# Patient Record
Sex: Male | Born: 1940 | Race: Black or African American | Hispanic: No | Marital: Married | Smoking: Never smoker
Health system: Southern US, Community
[De-identification: ages and names within clinical notes are randomized; demographics above are authoritative.]

## PROBLEM LIST (undated history)

## (undated) DIAGNOSIS — Z9119 Patient's noncompliance with other medical treatment and regimen: Secondary | ICD-10-CM

## (undated) DIAGNOSIS — I10 Essential (primary) hypertension: Secondary | ICD-10-CM

## (undated) DIAGNOSIS — L089 Local infection of the skin and subcutaneous tissue, unspecified: Secondary | ICD-10-CM

## (undated) DIAGNOSIS — M1990 Unspecified osteoarthritis, unspecified site: Secondary | ICD-10-CM

## (undated) DIAGNOSIS — S22080A Wedge compression fracture of T11-T12 vertebra, initial encounter for closed fracture: Secondary | ICD-10-CM

## (undated) DIAGNOSIS — Z91199 Patient's noncompliance with other medical treatment and regimen due to unspecified reason: Secondary | ICD-10-CM

## (undated) DIAGNOSIS — I428 Other cardiomyopathies: Secondary | ICD-10-CM

## (undated) DIAGNOSIS — E782 Mixed hyperlipidemia: Secondary | ICD-10-CM

## (undated) DIAGNOSIS — F09 Unspecified mental disorder due to known physiological condition: Secondary | ICD-10-CM

## (undated) DIAGNOSIS — R51 Headache: Secondary | ICD-10-CM

## (undated) DIAGNOSIS — C61 Malignant neoplasm of prostate: Secondary | ICD-10-CM

## (undated) DIAGNOSIS — R519 Headache, unspecified: Secondary | ICD-10-CM

## (undated) DIAGNOSIS — G8929 Other chronic pain: Secondary | ICD-10-CM

## (undated) DIAGNOSIS — E1142 Type 2 diabetes mellitus with diabetic polyneuropathy: Secondary | ICD-10-CM

## (undated) DIAGNOSIS — M810 Age-related osteoporosis without current pathological fracture: Secondary | ICD-10-CM

## (undated) DIAGNOSIS — F419 Anxiety disorder, unspecified: Secondary | ICD-10-CM

## (undated) HISTORY — DX: Local infection of the skin and subcutaneous tissue, unspecified: L08.9

## (undated) HISTORY — DX: Wedge compression fracture of T11-T12 vertebra, initial encounter for closed fracture: S22.080A

## (undated) HISTORY — DX: Age-related osteoporosis without current pathological fracture: M81.0

---

## 2001-04-20 ENCOUNTER — Encounter: Payer: Self-pay | Admitting: Emergency Medicine

## 2001-04-21 ENCOUNTER — Inpatient Hospital Stay (HOSPITAL_COMMUNITY): Admission: EM | Admit: 2001-04-21 | Discharge: 2001-04-27 | Payer: Self-pay | Admitting: Emergency Medicine

## 2001-04-21 ENCOUNTER — Encounter: Payer: Self-pay | Admitting: Family Medicine

## 2001-04-22 ENCOUNTER — Encounter: Payer: Self-pay | Admitting: Internal Medicine

## 2001-04-24 ENCOUNTER — Encounter: Payer: Self-pay | Admitting: Internal Medicine

## 2001-08-13 ENCOUNTER — Encounter: Payer: Self-pay | Admitting: *Deleted

## 2001-08-13 ENCOUNTER — Emergency Department (HOSPITAL_COMMUNITY): Admission: EM | Admit: 2001-08-13 | Discharge: 2001-08-13 | Payer: Self-pay | Admitting: *Deleted

## 2001-12-19 ENCOUNTER — Emergency Department (HOSPITAL_COMMUNITY): Admission: EM | Admit: 2001-12-19 | Discharge: 2001-12-19 | Payer: Self-pay | Admitting: Emergency Medicine

## 2002-01-24 ENCOUNTER — Encounter: Payer: Self-pay | Admitting: Emergency Medicine

## 2002-01-24 ENCOUNTER — Emergency Department (HOSPITAL_COMMUNITY): Admission: EM | Admit: 2002-01-24 | Discharge: 2002-01-24 | Payer: Self-pay | Admitting: Internal Medicine

## 2002-03-09 ENCOUNTER — Emergency Department (HOSPITAL_COMMUNITY): Admission: EM | Admit: 2002-03-09 | Discharge: 2002-03-09 | Payer: Self-pay | Admitting: *Deleted

## 2002-04-26 ENCOUNTER — Observation Stay (HOSPITAL_COMMUNITY): Admission: EM | Admit: 2002-04-26 | Discharge: 2002-04-28 | Payer: Self-pay | Admitting: Emergency Medicine

## 2002-04-26 ENCOUNTER — Encounter: Payer: Self-pay | Admitting: Emergency Medicine

## 2002-04-27 ENCOUNTER — Encounter: Payer: Self-pay | Admitting: Family Medicine

## 2002-05-15 ENCOUNTER — Ambulatory Visit (HOSPITAL_COMMUNITY): Admission: RE | Admit: 2002-05-15 | Discharge: 2002-05-15 | Payer: Self-pay | Admitting: General Surgery

## 2002-05-28 ENCOUNTER — Emergency Department (HOSPITAL_COMMUNITY): Admission: EM | Admit: 2002-05-28 | Discharge: 2002-05-28 | Payer: Self-pay | Admitting: *Deleted

## 2003-04-03 ENCOUNTER — Ambulatory Visit (HOSPITAL_COMMUNITY): Admission: RE | Admit: 2003-04-03 | Discharge: 2003-04-03 | Payer: Self-pay | Admitting: Family Medicine

## 2003-04-03 ENCOUNTER — Encounter: Payer: Self-pay | Admitting: Family Medicine

## 2003-05-06 ENCOUNTER — Emergency Department (HOSPITAL_COMMUNITY): Admission: EM | Admit: 2003-05-06 | Discharge: 2003-05-06 | Payer: Self-pay | Admitting: Emergency Medicine

## 2004-03-08 ENCOUNTER — Emergency Department (HOSPITAL_COMMUNITY): Admission: EM | Admit: 2004-03-08 | Discharge: 2004-03-08 | Payer: Self-pay | Admitting: Emergency Medicine

## 2004-03-30 ENCOUNTER — Emergency Department (HOSPITAL_COMMUNITY): Admission: EM | Admit: 2004-03-30 | Discharge: 2004-03-30 | Payer: Self-pay | Admitting: Emergency Medicine

## 2004-05-13 ENCOUNTER — Emergency Department (HOSPITAL_COMMUNITY): Admission: EM | Admit: 2004-05-13 | Discharge: 2004-05-13 | Payer: Self-pay | Admitting: Emergency Medicine

## 2004-05-26 ENCOUNTER — Emergency Department (HOSPITAL_COMMUNITY): Admission: EM | Admit: 2004-05-26 | Discharge: 2004-05-26 | Payer: Self-pay | Admitting: Emergency Medicine

## 2004-07-05 ENCOUNTER — Emergency Department (HOSPITAL_COMMUNITY): Admission: EM | Admit: 2004-07-05 | Discharge: 2004-07-05 | Payer: Self-pay | Admitting: Emergency Medicine

## 2004-07-31 ENCOUNTER — Emergency Department (HOSPITAL_COMMUNITY): Admission: EM | Admit: 2004-07-31 | Discharge: 2004-07-31 | Payer: Self-pay | Admitting: Emergency Medicine

## 2005-05-31 ENCOUNTER — Emergency Department (HOSPITAL_COMMUNITY): Admission: EM | Admit: 2005-05-31 | Discharge: 2005-05-31 | Payer: Self-pay | Admitting: Emergency Medicine

## 2005-07-15 ENCOUNTER — Emergency Department (HOSPITAL_COMMUNITY): Admission: EM | Admit: 2005-07-15 | Discharge: 2005-07-15 | Payer: Self-pay | Admitting: Emergency Medicine

## 2005-08-31 ENCOUNTER — Emergency Department (HOSPITAL_COMMUNITY): Admission: EM | Admit: 2005-08-31 | Discharge: 2005-08-31 | Payer: Self-pay | Admitting: Emergency Medicine

## 2006-05-29 ENCOUNTER — Emergency Department (HOSPITAL_COMMUNITY): Admission: EM | Admit: 2006-05-29 | Discharge: 2006-05-29 | Payer: Self-pay | Admitting: Emergency Medicine

## 2007-03-15 ENCOUNTER — Emergency Department (HOSPITAL_COMMUNITY): Admission: EM | Admit: 2007-03-15 | Discharge: 2007-03-15 | Payer: Self-pay | Admitting: Emergency Medicine

## 2007-05-27 ENCOUNTER — Emergency Department (HOSPITAL_COMMUNITY): Admission: EM | Admit: 2007-05-27 | Discharge: 2007-05-27 | Payer: Self-pay | Admitting: Emergency Medicine

## 2007-08-16 ENCOUNTER — Emergency Department (HOSPITAL_COMMUNITY): Admission: EM | Admit: 2007-08-16 | Discharge: 2007-08-16 | Payer: Self-pay | Admitting: Emergency Medicine

## 2007-11-05 ENCOUNTER — Emergency Department (HOSPITAL_COMMUNITY): Admission: EM | Admit: 2007-11-05 | Discharge: 2007-11-05 | Payer: Self-pay | Admitting: Emergency Medicine

## 2007-11-09 DIAGNOSIS — C61 Malignant neoplasm of prostate: Secondary | ICD-10-CM

## 2007-11-09 HISTORY — DX: Malignant neoplasm of prostate: C61

## 2007-12-29 ENCOUNTER — Emergency Department (HOSPITAL_COMMUNITY): Admission: EM | Admit: 2007-12-29 | Discharge: 2007-12-29 | Payer: Self-pay | Admitting: Emergency Medicine

## 2008-02-03 ENCOUNTER — Emergency Department (HOSPITAL_COMMUNITY): Admission: EM | Admit: 2008-02-03 | Discharge: 2008-02-03 | Payer: Self-pay | Admitting: Emergency Medicine

## 2008-02-04 ENCOUNTER — Inpatient Hospital Stay (HOSPITAL_COMMUNITY): Admission: EM | Admit: 2008-02-04 | Discharge: 2008-02-08 | Payer: Self-pay | Admitting: Emergency Medicine

## 2008-03-08 HISTORY — PX: TRANSURETHRAL RESECTION OF PROSTATE: SHX73

## 2008-03-12 ENCOUNTER — Emergency Department (HOSPITAL_COMMUNITY): Admission: EM | Admit: 2008-03-12 | Discharge: 2008-03-12 | Payer: Self-pay | Admitting: Emergency Medicine

## 2008-03-26 ENCOUNTER — Emergency Department (HOSPITAL_COMMUNITY): Admission: EM | Admit: 2008-03-26 | Discharge: 2008-03-26 | Payer: Self-pay | Admitting: Emergency Medicine

## 2008-04-02 ENCOUNTER — Encounter (INDEPENDENT_AMBULATORY_CARE_PROVIDER_SITE_OTHER): Payer: Self-pay | Admitting: Urology

## 2008-04-02 ENCOUNTER — Inpatient Hospital Stay (HOSPITAL_COMMUNITY): Admission: RE | Admit: 2008-04-02 | Discharge: 2008-04-05 | Payer: Self-pay | Admitting: Urology

## 2008-04-20 ENCOUNTER — Emergency Department (HOSPITAL_COMMUNITY): Admission: EM | Admit: 2008-04-20 | Discharge: 2008-04-20 | Payer: Self-pay | Admitting: Emergency Medicine

## 2008-04-25 ENCOUNTER — Encounter (HOSPITAL_COMMUNITY): Admission: RE | Admit: 2008-04-25 | Discharge: 2008-05-25 | Payer: Self-pay | Admitting: Urology

## 2008-06-05 ENCOUNTER — Ambulatory Visit: Admission: RE | Admit: 2008-06-05 | Discharge: 2008-09-03 | Payer: Self-pay | Admitting: Radiation Oncology

## 2009-02-06 HISTORY — PX: CARDIAC CATHETERIZATION: SHX172

## 2009-02-08 ENCOUNTER — Inpatient Hospital Stay (HOSPITAL_COMMUNITY): Admission: EM | Admit: 2009-02-08 | Discharge: 2009-02-11 | Payer: Self-pay | Admitting: Emergency Medicine

## 2009-02-08 ENCOUNTER — Ambulatory Visit: Payer: Self-pay | Admitting: Cardiology

## 2009-02-10 ENCOUNTER — Other Ambulatory Visit: Payer: Self-pay | Admitting: Cardiology

## 2009-02-10 ENCOUNTER — Ambulatory Visit: Payer: Self-pay | Admitting: Cardiovascular Disease

## 2009-02-10 ENCOUNTER — Encounter: Payer: Self-pay | Admitting: Cardiology

## 2009-02-10 ENCOUNTER — Encounter (INDEPENDENT_AMBULATORY_CARE_PROVIDER_SITE_OTHER): Payer: Self-pay | Admitting: Pulmonary Disease

## 2009-02-10 LAB — CONVERTED CEMR LAB
ALT: 10 units/L
AST: 19 units/L
Basophils Relative: 0.84 %
HDL: 44 mg/dL
MCV: 91 fL
Total Bilirubin: 0.8 mg/dL
Triglyceride fasting, serum: 94 mg/dL
WBC: 4.3 10*3/uL

## 2009-02-11 ENCOUNTER — Other Ambulatory Visit: Payer: Self-pay | Admitting: Cardiology

## 2009-02-11 ENCOUNTER — Encounter: Payer: Self-pay | Admitting: Cardiovascular Disease

## 2009-02-26 ENCOUNTER — Ambulatory Visit: Payer: Self-pay | Admitting: Physician Assistant

## 2009-03-03 ENCOUNTER — Ambulatory Visit: Payer: Self-pay | Admitting: Cardiology

## 2009-03-10 ENCOUNTER — Emergency Department (HOSPITAL_COMMUNITY): Admission: EM | Admit: 2009-03-10 | Discharge: 2009-03-10 | Payer: Self-pay | Admitting: Emergency Medicine

## 2009-08-31 ENCOUNTER — Emergency Department (HOSPITAL_COMMUNITY): Admission: EM | Admit: 2009-08-31 | Discharge: 2009-08-31 | Payer: Self-pay | Admitting: Emergency Medicine

## 2009-11-23 ENCOUNTER — Emergency Department (HOSPITAL_COMMUNITY): Admission: EM | Admit: 2009-11-23 | Discharge: 2009-11-23 | Payer: Self-pay | Admitting: Emergency Medicine

## 2009-12-04 ENCOUNTER — Ambulatory Visit (HOSPITAL_COMMUNITY): Admission: RE | Admit: 2009-12-04 | Discharge: 2009-12-04 | Payer: Self-pay | Admitting: Urology

## 2010-01-13 ENCOUNTER — Ambulatory Visit (HOSPITAL_COMMUNITY): Admission: RE | Admit: 2010-01-13 | Discharge: 2010-01-14 | Payer: Self-pay | Admitting: Urology

## 2010-01-13 ENCOUNTER — Encounter (INDEPENDENT_AMBULATORY_CARE_PROVIDER_SITE_OTHER): Payer: Self-pay | Admitting: Urology

## 2010-02-10 ENCOUNTER — Ambulatory Visit (HOSPITAL_COMMUNITY): Payer: Self-pay | Admitting: Oncology

## 2010-02-10 ENCOUNTER — Encounter (HOSPITAL_COMMUNITY): Admission: RE | Admit: 2010-02-10 | Discharge: 2010-03-12 | Payer: Self-pay | Admitting: Oncology

## 2010-03-12 ENCOUNTER — Encounter (INDEPENDENT_AMBULATORY_CARE_PROVIDER_SITE_OTHER): Payer: Self-pay | Admitting: *Deleted

## 2010-03-17 ENCOUNTER — Emergency Department (HOSPITAL_COMMUNITY): Admission: EM | Admit: 2010-03-17 | Discharge: 2010-03-18 | Payer: Self-pay | Admitting: Emergency Medicine

## 2010-03-18 ENCOUNTER — Inpatient Hospital Stay (HOSPITAL_COMMUNITY): Admission: EM | Admit: 2010-03-18 | Discharge: 2010-03-25 | Payer: Self-pay | Admitting: Emergency Medicine

## 2010-03-23 ENCOUNTER — Ambulatory Visit: Payer: Self-pay | Admitting: Oncology

## 2010-04-17 ENCOUNTER — Ambulatory Visit (HOSPITAL_COMMUNITY): Payer: Self-pay | Admitting: Oncology

## 2010-04-17 ENCOUNTER — Encounter (HOSPITAL_COMMUNITY): Admission: RE | Admit: 2010-04-17 | Discharge: 2010-05-17 | Payer: Self-pay | Admitting: Oncology

## 2010-04-21 ENCOUNTER — Encounter (INDEPENDENT_AMBULATORY_CARE_PROVIDER_SITE_OTHER): Payer: Self-pay | Admitting: *Deleted

## 2010-04-21 LAB — CONVERTED CEMR LAB
BUN: 19 mg/dL
CO2: 24 meq/L
Chloride: 105 meq/L
Creatinine, Ser: 0.67 mg/dL
Glucose, Bld: 152 mg/dL
Hgb A1c MFr Bld: 9.7 %
Potassium: 3.8 meq/L

## 2010-05-02 ENCOUNTER — Inpatient Hospital Stay (HOSPITAL_COMMUNITY): Admission: AD | Admit: 2010-05-02 | Discharge: 2010-05-07 | Payer: Self-pay | Admitting: Emergency Medicine

## 2010-05-04 ENCOUNTER — Encounter (INDEPENDENT_AMBULATORY_CARE_PROVIDER_SITE_OTHER): Payer: Self-pay | Admitting: Family Medicine

## 2010-05-08 ENCOUNTER — Emergency Department (HOSPITAL_COMMUNITY): Admission: EM | Admit: 2010-05-08 | Discharge: 2010-05-08 | Payer: Self-pay | Admitting: Emergency Medicine

## 2010-05-15 ENCOUNTER — Emergency Department (HOSPITAL_COMMUNITY): Admission: EM | Admit: 2010-05-15 | Discharge: 2010-05-15 | Payer: Self-pay | Admitting: Emergency Medicine

## 2010-05-16 ENCOUNTER — Emergency Department (HOSPITAL_COMMUNITY): Admission: EM | Admit: 2010-05-16 | Discharge: 2010-05-16 | Payer: Self-pay | Admitting: Emergency Medicine

## 2010-05-20 ENCOUNTER — Encounter (HOSPITAL_COMMUNITY): Admission: RE | Admit: 2010-05-20 | Discharge: 2010-06-19 | Payer: Self-pay | Admitting: Oncology

## 2010-06-08 ENCOUNTER — Emergency Department (HOSPITAL_COMMUNITY): Admission: EM | Admit: 2010-06-08 | Discharge: 2010-06-08 | Payer: Self-pay | Admitting: Emergency Medicine

## 2010-06-09 ENCOUNTER — Emergency Department (HOSPITAL_COMMUNITY): Admission: EM | Admit: 2010-06-09 | Discharge: 2010-06-09 | Payer: Self-pay | Admitting: Emergency Medicine

## 2010-06-15 ENCOUNTER — Encounter (INDEPENDENT_AMBULATORY_CARE_PROVIDER_SITE_OTHER): Payer: Self-pay | Admitting: *Deleted

## 2010-06-17 ENCOUNTER — Ambulatory Visit (HOSPITAL_COMMUNITY): Payer: Self-pay | Admitting: Oncology

## 2010-06-22 ENCOUNTER — Ambulatory Visit: Payer: Self-pay | Admitting: Cardiology

## 2010-06-22 DIAGNOSIS — C61 Malignant neoplasm of prostate: Secondary | ICD-10-CM

## 2010-06-23 ENCOUNTER — Encounter: Payer: Self-pay | Admitting: Adult Health

## 2010-06-23 ENCOUNTER — Ambulatory Visit (HOSPITAL_COMMUNITY): Admission: RE | Admit: 2010-06-23 | Discharge: 2010-06-23 | Payer: Self-pay | Admitting: Urology

## 2010-06-24 ENCOUNTER — Encounter (HOSPITAL_COMMUNITY): Admission: RE | Admit: 2010-06-24 | Discharge: 2010-07-24 | Payer: Self-pay | Admitting: Oncology

## 2010-07-25 ENCOUNTER — Emergency Department (HOSPITAL_COMMUNITY): Admission: EM | Admit: 2010-07-25 | Discharge: 2010-07-25 | Payer: Self-pay | Admitting: Emergency Medicine

## 2010-08-05 ENCOUNTER — Ambulatory Visit: Payer: Self-pay | Admitting: Gastroenterology

## 2010-08-12 ENCOUNTER — Encounter (HOSPITAL_COMMUNITY)
Admission: RE | Admit: 2010-08-12 | Discharge: 2010-09-11 | Payer: Self-pay | Source: Home / Self Care | Admitting: Oncology

## 2010-08-12 ENCOUNTER — Ambulatory Visit (HOSPITAL_COMMUNITY): Payer: Self-pay | Admitting: Oncology

## 2010-08-12 ENCOUNTER — Ambulatory Visit (HOSPITAL_COMMUNITY): Admission: RE | Admit: 2010-08-12 | Discharge: 2010-08-12 | Payer: Self-pay | Admitting: Urology

## 2010-08-14 ENCOUNTER — Ambulatory Visit (HOSPITAL_COMMUNITY): Admission: RE | Admit: 2010-08-14 | Discharge: 2010-08-14 | Payer: Self-pay | Admitting: Gastroenterology

## 2010-08-14 ENCOUNTER — Ambulatory Visit: Payer: Self-pay | Admitting: Gastroenterology

## 2010-09-17 ENCOUNTER — Encounter (INDEPENDENT_AMBULATORY_CARE_PROVIDER_SITE_OTHER): Payer: Self-pay | Admitting: *Deleted

## 2010-09-21 ENCOUNTER — Encounter (INDEPENDENT_AMBULATORY_CARE_PROVIDER_SITE_OTHER): Payer: Self-pay | Admitting: *Deleted

## 2010-09-21 ENCOUNTER — Encounter: Payer: Self-pay | Admitting: Cardiology

## 2010-09-21 DIAGNOSIS — E118 Type 2 diabetes mellitus with unspecified complications: Secondary | ICD-10-CM | POA: Insufficient documentation

## 2010-09-21 DIAGNOSIS — E785 Hyperlipidemia, unspecified: Secondary | ICD-10-CM

## 2010-09-28 ENCOUNTER — Ambulatory Visit (HOSPITAL_COMMUNITY): Payer: Self-pay | Admitting: Oncology

## 2010-09-28 ENCOUNTER — Encounter (HOSPITAL_COMMUNITY)
Admission: RE | Admit: 2010-09-28 | Discharge: 2010-10-28 | Payer: Self-pay | Source: Home / Self Care | Attending: Oncology | Admitting: Oncology

## 2010-10-26 ENCOUNTER — Ambulatory Visit: Payer: Self-pay | Admitting: Cardiology

## 2010-10-27 ENCOUNTER — Encounter: Payer: Self-pay | Admitting: Adult Health

## 2010-10-28 ENCOUNTER — Encounter (INDEPENDENT_AMBULATORY_CARE_PROVIDER_SITE_OTHER): Payer: Self-pay | Admitting: *Deleted

## 2010-10-28 LAB — CONVERTED CEMR LAB
Albumin: 3.7 g/dL
Alkaline Phosphatase: 93 units/L
Brain Natriuretic Peptide: 327.1
CO2: 24 meq/L
Calcium: 9.2 mg/dL
Chloride: 100 meq/L
Eosinophils Absolute: 0.3 10*3/uL
Glucose, Bld: 279 mg/dL
Hemoglobin: 10.5 g/dL
Lymphocytes Relative: 19 %
Lymphs Abs: 1.2 10*3/uL
Monocytes Relative: 11 %
RBC: 3.86 M/uL
RDW: 17.2 %
Sodium: 136 meq/L
Total Protein: 6.5 g/dL

## 2010-10-30 ENCOUNTER — Encounter: Payer: Self-pay | Admitting: Adult Health

## 2010-10-30 LAB — CONVERTED CEMR LAB
ALT: 15 units/L (ref 0–53)
Albumin: 3.7 g/dL (ref 3.5–5.2)
BUN: 25 mg/dL — ABNORMAL HIGH (ref 6–23)
CO2: 24 meq/L (ref 19–32)
Calcium: 9.2 mg/dL (ref 8.4–10.5)
Chloride: 100 meq/L (ref 96–112)
Creatinine, Ser: 0.96 mg/dL (ref 0.40–1.50)
Hemoglobin: 10.5 g/dL — ABNORMAL LOW (ref 13.0–17.0)
Lymphocytes Relative: 19 % (ref 12–46)
Monocytes Absolute: 0.7 10*3/uL (ref 0.1–1.0)
Monocytes Relative: 11 % (ref 3–12)
Neutro Abs: 4.2 10*3/uL (ref 1.7–7.7)
Potassium: 3.5 meq/L (ref 3.5–5.3)
Pro B Natriuretic peptide (BNP): 327.1 pg/mL — ABNORMAL HIGH (ref 0.0–100.0)
RBC: 3.86 M/uL — ABNORMAL LOW (ref 4.22–5.81)
WBC: 6.4 10*3/uL (ref 4.0–10.5)

## 2010-11-03 ENCOUNTER — Ambulatory Visit (HOSPITAL_COMMUNITY)
Admission: RE | Admit: 2010-11-03 | Discharge: 2010-11-03 | Payer: Self-pay | Source: Home / Self Care | Attending: Cardiology | Admitting: Cardiology

## 2010-11-11 ENCOUNTER — Encounter (HOSPITAL_COMMUNITY)
Admission: RE | Admit: 2010-11-11 | Discharge: 2010-12-08 | Payer: Self-pay | Source: Home / Self Care | Attending: Oncology | Admitting: Oncology

## 2010-11-11 LAB — CBC
HCT: 29.9 % — ABNORMAL LOW (ref 39.0–52.0)
Hemoglobin: 10 g/dL — ABNORMAL LOW (ref 13.0–17.0)
MCH: 27.9 pg (ref 26.0–34.0)
MCHC: 33.4 g/dL (ref 30.0–36.0)
MCV: 83.5 fL (ref 78.0–100.0)
Platelets: 408 10*3/uL — ABNORMAL HIGH (ref 150–400)
RBC: 3.58 MIL/uL — ABNORMAL LOW (ref 4.22–5.81)
RDW: 16.1 % — ABNORMAL HIGH (ref 11.5–15.5)
WBC: 5.2 10*3/uL (ref 4.0–10.5)

## 2010-11-11 LAB — COMPREHENSIVE METABOLIC PANEL
ALT: 21 U/L (ref 0–53)
AST: 18 U/L (ref 0–37)
Albumin: 3.1 g/dL — ABNORMAL LOW (ref 3.5–5.2)
Alkaline Phosphatase: 71 U/L (ref 39–117)
BUN: 24 mg/dL — ABNORMAL HIGH (ref 6–23)
CO2: 25 mEq/L (ref 19–32)
Calcium: 9 mg/dL (ref 8.4–10.5)
Chloride: 106 mEq/L (ref 96–112)
Creatinine, Ser: 0.88 mg/dL (ref 0.4–1.5)
GFR calc Af Amer: >60 mL/min (ref >60)
GFR calc non Af Amer: >60 mL/min (ref >60)
Glucose, Bld: 104 mg/dL — ABNORMAL HIGH (ref 70–99)
Potassium: 3.9 mEq/L (ref 3.5–5.1)
Sodium: 140 mEq/L (ref 135–145)
Total Bilirubin: 0.4 mg/dL (ref 0.3–1.2)
Total Protein: 6.6 g/dL (ref 6.0–8.3)

## 2010-11-11 LAB — PSA: PSA: 0.01 ng/mL — ABNORMAL LOW (ref <=4.00)

## 2010-11-16 ENCOUNTER — Encounter (INDEPENDENT_AMBULATORY_CARE_PROVIDER_SITE_OTHER): Payer: Self-pay | Admitting: *Deleted

## 2010-11-17 ENCOUNTER — Ambulatory Visit
Admission: RE | Admit: 2010-11-17 | Discharge: 2010-11-17 | Payer: Self-pay | Source: Home / Self Care | Attending: Cardiology | Admitting: Cardiology

## 2010-11-28 ENCOUNTER — Encounter: Payer: Self-pay | Admitting: Urology

## 2010-12-08 NOTE — Miscellaneous (Signed)
Summary: labs bmp,a1c,04/21/2010  Clinical Lists Changes  Observations: Added new observation of CALCIUM: 9.4 mg/dL (11/91/4782 95:62) Added new observation of CREATININE: 0.67 mg/dL (13/06/6577 46:96) Added new observation of BUN: 19 mg/dL (29/52/8413 24:40) Added new observation of BG RANDOM: 152 mg/dL (09/04/2535 64:40) Added new observation of CO2 PLSM/SER: 24 meq/L (04/21/2010 10:44) Added new observation of CL SERUM: 105 meq/L (04/21/2010 10:44) Added new observation of K SERUM: 3.8 meq/L (04/21/2010 10:44) Added new observation of NA: 140 meq/L (04/21/2010 10:44) Added new observation of HGBA1C: 9.7 % (04/21/2010 10:44)

## 2010-12-08 NOTE — Miscellaneous (Signed)
Summary: LABS BMP,A1C,04/21/2010  Clinical Lists Changes  Observations: Added new observation of CALCIUM: 9.4 mg/dL (54/07/8118 14:78) Added new observation of CREATININE: 0.67 mg/dL (29/56/2130 86:57) Added new observation of BUN: 19 mg/dL (84/69/6295 28:41) Added new observation of BG RANDOM: 152 mg/dL (32/44/0102 72:53) Added new observation of CO2 PLSM/SER: 24 meq/L (04/21/2010 14:08) Added new observation of CL SERUM: 105 meq/L (04/21/2010 14:08) Added new observation of K SERUM: 3.8 meq/L (04/21/2010 14:08) Added new observation of NA: 140 meq/L (04/21/2010 14:08) Added new observation of HGBA1C: 9.7 % (04/21/2010 14:08)

## 2010-12-08 NOTE — Miscellaneous (Signed)
Summary: carvedilol refill  Clinical Lists Changes  Medications: Added new medication of CARVEDILOL 6.25 MG TABS (CARVEDILOL) Take one tablet by mouth twice a day - Signed Rx of CARVEDILOL 6.25 MG TABS (CARVEDILOL) Take one tablet by mouth twice a day;  #60 x 0;  Signed;  Entered by: Teressa Lower RN;  Authorized by: Kathlen Brunswick, MD, [facility];  Method used: Electronically to [facility] [address], [place], [place], [place]  [address], Ph: [phone], Fax: [phone]    Prescriptions: CARVEDILOL 6.25 MG TABS (CARVEDILOL) Take one tablet by mouth twice a day  #60 x 0   Entered by:   Teressa Lower RN   Authorized by:   Kathlen Brunswick, MD, [facility]   Signed by:   Teressa Lower RN on 03/12/2010   Method used:   Electronically to        [facility]  [address] (retail)       [address]       [place], [place]  [address]       Ph: [phone]       Fax: [phone]   RxID:   [id number]

## 2010-12-08 NOTE — Assessment & Plan Note (Signed)
Summary: past due for f/u per pt's wife/tg   Visit Type:  Follow-up Referring Provider:  Dr.Javaid-urologist Primary Provider:  Dr.Dondiego  CC:  some sob.  History of Present Illness: Oscar Hickman is a very pleasant 70 y/o AAM with recent diagnosis of metastatic prostate CA.  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  Cardiac catherization in April 2010 showed nonobstructive CAD.  He has been started on Coreg 6.25mg  two times a day along with ACE-inhibitor prior to recent discharge on 6/24/20ll.  He presents today with no major complaints except for mild DOE and abdominal discomfort. He is compliant with medications, as his wife makes sure he gets them.  Current Medications (verified): 1)  Carvedilol 12.5 Mg Tabs (Carvedilol) .... Take 1 Tablet By Mouth Two Times A Day 2)  Bicalutamide 50 Mg Tabs (Bicalutamide) .... Take 1 Tab Daily 3)  Amlodipine Besylate 10 Mg Tabs (Amlodipine Besylate) .... Take 1 Tab Daily 4)  Metformin Hcl 500 Mg Tabs (Metformin Hcl) .... Take 1 Tab Two Times A Day 5)  Aspir-Low 81 Mg Tbec (Aspirin) .... Take 1 Tab Daily 6)  Sulfamethoxazole-Trimethoprim 400-80 Mg/30ml Soln (Sulfamethoxazole-Trimethoprim) .... Take 1 Tab Daily 7)  Tamsulosin Hcl 0.4 Mg Caps (Tamsulosin Hcl) .... Take 1 Tab Daily 8)  Quinapril Hcl 40 Mg Tabs (Quinapril Hcl) .... Take 1 Tab Daily 9)  Doxycycline Hyclate 100 Mg Caps (Doxycycline Hyclate) .... Take 1 Tab Two Times A Day  Allergies (verified): No Known Drug Allergies PMH-FH-SH reviewed-no changes except otherwise noted  Review of Systems       All other systems have been reviewed and are negative unless stated above.   Vital Signs:  Patient profile:   70 year old male Height:      63 inches Weight:      189 pounds BMI:     33.60 Pulse rate:   109 / minute BP sitting:   150 / 92  (right arm)  Vitals Entered By: Dreama Saa, CNA (June 22, 2010 2:43 PM)  Physical Exam  General:  Well developed, well  nourished, in no acute distress. Lungs:  Clear bilaterally to auscultation and percussion.decreased BS bilateral.   Heart:  Non-displaced PMI, chest non-tender;tachycardic rate and rhythm, S1, S2 without murmurs, rubs or gallops. Carotid upstroke normal, no bruit. Normal abdominal aortic size, no bruits. Femorals normal pulses, no bruits. Pedals normal pulses. No edema, no varicosities. Abdomen:  Bowel sounds positive; abdomen soft and non-tender Genitalia:  Urinary catheter bag. Extremities:  No clubbing or cyanosis. Neurologic:  Alert and oriented x 3. Psych:  Normal affect.   EKG  Procedure date:  06/22/2010  Findings:      Sinus tachycardia with rate of:  103  Impression & Recommendations:  Problem # 1:  OTHER PRIMARY CARDIOMYOPATHIES (ICD-425.4) He is complaining of mild DOE.  His heart rate is not well controlled or BP where I want it with EF of 34%.  Plan on increasing Coreg to 12.5mg  two times a day.  This has been discussed with his wife who gives him his medications.  We will see him in one month. His updated medication list for this problem includes:    Carvedilol 12.5 Mg Tabs (Carvedilol) .Marland Kitchen... Take 1 tablet by mouth two times a day    Amlodipine Besylate 10 Mg Tabs (Amlodipine besylate) .Marland Kitchen... Take 1 tab daily    Aspir-low 81 Mg Tbec (Aspirin) .Marland Kitchen... Take 1 tab daily    Quinapril Hcl 40 Mg  Tabs (Quinapril hcl) .Marland Kitchen... Take 1 tab daily  Problem # 2:  ADENOCARCINOMA, PROSTATE (ICD-185) Assessment: Deteriorated  Patient Instructions: 1)  Your physician recommends that you schedule a follow-up appointment in: 3 months 2)  Your physician has recommended you make the following change in your medication: increase Carvedilol to 12.5mg  by mouth two times a day (may double current tablets to equal 12.5mg  until you finish current bottle) 3)  Please get a pill container to keep up with medications and dosing. Prescriptions: CARVEDILOL 12.5 MG TABS (CARVEDILOL) take 1 tablet by mouth  two times a day  #60 x 3   Entered by:   Larita Fife Via LPN   Authorized by:   Joni Reining, NP   Signed by:   Larita Fife Via LPN on 78/46/9629   Method used:   Electronically to        [facility]* (retail)       [address] [place], [place]  [address]       Ph: [phone]       Fax: [phone]   RxID:   [id number]

## 2010-12-08 NOTE — Miscellaneous (Signed)
Summary: Missed three-month followup   CXR  Procedure date:  02/08/2009  Findings:      Cardiomegaly Calcification of the thoracic aorta Clear lung fields No pleural effusion  -  Date:  02/10/2009    WBC: 4.3    HGB: 13.6    HCT: 40.5    PLT: 220    MCV: 91    Basophil: 0.84    SGOT (AST): 19    SGPT (ALT): 10    T. 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 Resides in [place]; expecting his 16th grandchild and his first great grandchild. Retired from work at [employer]

## 2010-12-08 NOTE — Assessment & Plan Note (Signed)
Summary: HEME POS STOOLS   Visit Type:  Initial Consult Referring Provider:  Mariel Sleet, M.D. Primary Care Provider:  Janna Arch, M.D.  Chief Complaint:  heme pos stools.  History of Present Illness: Wiped self and saw blood a couple of times. Heme pos 2/3 stools. Still having blood in his urine and sometimes clear. No belly pain. Appetite: eats good. no nausea or vomiting. No melena, heartburn/indigestion, problems swallowing. Was taking ASA. No diarrhea or rare: constipation. No weight loss. HAS PSA MONTHLY AND EVERY 3 MOS HE GETS A SHOT. NO COLONOSCOPY.  Current Medications (verified): 1)  Carvedilol 12.5 Mg Tabs (Carvedilol) .... Take 1 Tablet By Mouth Two Times A Day 2)  Bicalutamide 50 Mg Tabs (Bicalutamide) .... Take 1 Tab Daily 3)  Amlodipine Besylate 10 Mg Tabs (Amlodipine Besylate) .... Take 1 Tab Daily 4)  Metformin Hcl 500 Mg Tabs (Metformin Hcl) .... Take 1 Tab Two Times A Day 5)  Tamsulosin Hcl 0.4 Mg Caps (Tamsulosin Hcl) .... Take 1 Tab Daily 6)  Quinapril Hcl 40 Mg Tabs (Quinapril Hcl) .... Take 1 Tab Daily 7)  Cipro 500 Mg Tabs (Ciprofloxacin Hcl) .... Take 1 Tablet By Mouth Two Times A Day 8)  Triamterene-Hctz 37.5-25 Mg Tabs (Triamterene-Hctz) .... Take 1 Tablet By Mouth Once A Day  Allergies (verified): No Known Drug Allergies  Past History:  Past Medical History: Last updated: 02/26/2009 Non-ischemic cardiomyopathy with ejection fraction of 25% Minimal nonobstructive coronary artery disease by cardiac catheterization April 2010 Diabetes mellitus Hypertension Hyperlipidemia Anxiety Osteoarthritis Prostate cancer Benign prostatic hypertrophy  Past Surgical History: Last updated: 02/26/2009 Cardiac catheterization April 2010 TURP May 2009 Prostate cancer resection and radiation therapy  Family History: Both parents died from congestive heart failure Brother with myocardial infarction and stroke Son with MI in his 51s No FH of Colon Cancer or  polyps  Social History: Former drinker-no current alcohol use Nonsmoker Married. Expecting his 16th grandchild and his first great grandchild. Retired  Review of Systems       PER HPI OTHERWISE ALL SYSTEMS NEGATIVE.  Vital Signs:  Patient profile:   70 year old male Height:      63 inches Weight:      181 pounds BMI:     32.18 Temp:     97.9 degrees F oral Pulse rate:   64 / minute BP sitting:   130 / 80  (left arm) Cuff size:   regular  Vitals Entered By: Cloria Spring LPN (August 05, 2010 10:55 AM)  Physical Exam  General:  Well developed, well nourished, no acute distress. Head:  Normocephalic and atraumatic. Eyes:  PERRL, no icterus. Mouth:  No deformity or lesions. Neck:  Supple; no masses. Lungs:  Clear throughout to auscultation. Heart:  Regular rate and rhythm; no murmurs. Abdomen:  Soft, nontender and nondistended. No masses noted. Normal bowel sounds. Extremities:  No edema or deformities noted. Neurologic:  Alert and  oriented x4;  grossly normal neurologically.  Impression & Recommendations:  Problem # 1:  HEMOCCULT POSITIVE STOOL (ICD-578.1) Assessment New LIKELY 2o TO locally invasive prostate CA, radiation proctitis, and less likely colorectal polyp or malignancy. Pt declines TCS at this time. FLEX SIG NEXT WEEK. OPV AFTER ENDO PERFORMED. Pt may need TCS eventually.  CC: PCP AND REF MD  Appended Document: Orders Update    Clinical Lists Changes  Orders: Added new Service order of New Patient Level IV (60454) - Signed

## 2010-12-08 NOTE — Letter (Signed)
Summary: Appointment - Missed  [name]  [address], [place] [address]   Phone: [phone]  Fax: [phone]     September 21, 2010 MRN: [id number]   Oscar Hickman [address] [place], [place]  [address]   Dear Mr. Unrein,  Our records indicate you missed your appointment on           09/21/10             with Dr.    Dietrich Pates      .                                    It is very important that we reach you to reschedule this appointment. We look forward to participating in your health care needs. Please contact us at the number listed above at your earliest convenience to reschedule this appointment.     Sincerely,    Glass blower/designer

## 2010-12-08 NOTE — Letter (Signed)
Summary: FLEX SIG ORDER  FLEX SIG ORDER   Imported By: Ave Filter 08/05/2010 12:05:49  _____________________________________________________________________  External Attachment:    Type:   Image     Comment:   External Document

## 2010-12-09 ENCOUNTER — Other Ambulatory Visit (HOSPITAL_COMMUNITY): Payer: Self-pay | Admitting: Oncology

## 2010-12-09 ENCOUNTER — Other Ambulatory Visit (HOSPITAL_COMMUNITY): Payer: Medicare HMO

## 2010-12-09 ENCOUNTER — Encounter (HOSPITAL_COMMUNITY): Payer: Medicare HMO | Attending: Oncology

## 2010-12-09 ENCOUNTER — Ambulatory Visit (HOSPITAL_COMMUNITY): Admit: 2010-12-09 | Payer: Self-pay | Admitting: Oncology

## 2010-12-09 DIAGNOSIS — C61 Malignant neoplasm of prostate: Secondary | ICD-10-CM | POA: Insufficient documentation

## 2010-12-09 DIAGNOSIS — E119 Type 2 diabetes mellitus without complications: Secondary | ICD-10-CM | POA: Insufficient documentation

## 2010-12-09 DIAGNOSIS — Z79899 Other long term (current) drug therapy: Secondary | ICD-10-CM | POA: Insufficient documentation

## 2010-12-09 LAB — COMPREHENSIVE METABOLIC PANEL
ALT: 11 U/L (ref 0–53)
AST: 14 U/L (ref 0–37)
Albumin: 3.3 g/dL — ABNORMAL LOW (ref 3.5–5.2)
CO2: 25 mEq/L (ref 19–32)
Chloride: 106 mEq/L (ref 96–112)
Creatinine, Ser: 0.83 mg/dL (ref 0.4–1.5)
GFR calc Af Amer: >60 mL/min (ref >60)
GFR calc non Af Amer: >60 mL/min (ref >60)
Potassium: 4 mEq/L (ref 3.5–5.1)
Sodium: 140 mEq/L (ref 135–145)
Total Bilirubin: 0.4 mg/dL (ref 0.3–1.2)

## 2010-12-09 LAB — CBC
Hemoglobin: 10.4 g/dL — ABNORMAL LOW (ref 13.0–17.0)
MCH: 27.6 pg (ref 26.0–34.0)
Platelets: 367 10*3/uL (ref 150–400)
RBC: 3.77 MIL/uL — ABNORMAL LOW (ref 4.22–5.81)
WBC: 4.7 10*3/uL (ref 4.0–10.5)

## 2010-12-10 NOTE — Assessment & Plan Note (Signed)
Summary: f55m   Visit Type:  Follow-up Referring Provider:  Oncology-Dr.Neijstrom Primary Provider:  Dr. Oval Linsey   History of Present Illness: Oscar Hickman is a 70 y/o AAM with hx of metastatic CA, we follow him for nonischemic CM with EF of 35% per echo in April of 2010.  He has completed chemo and radiation and is now being followed periodically by Dr. Loyola Mast.  He added Triameterene/HCTZ to medication regimin secondary to fluid retention on his last visit. Oscar Hickman states that the swelling went away with use of this.   He unfortunately is not compliant with sodium restricted diet and often eats at fast food restaraunts.  He does not cook with salt, but does eat cold cuts and sausage.  He has gain 15lbs since being seen last in August of 2011.  He is asymptomatic at present with no complaints of DOE or chest discomfort.  He wife states that he is very inactive. His main complaint is chronic itching of his back.  Current Medications (verified): 1)  Carvedilol 12.5 Mg Tabs (Carvedilol) .... Take 1 Tablet By Mouth Two Times A Day 2)  Bicalutamide 50 Mg Tabs (Bicalutamide) .... Take 1 Tab Daily 3)  Amlodipine Besylate 10 Mg Tabs (Amlodipine Besylate) .... Take 1 Tab Daily 4)  Metformin Hcl 500 Mg Tabs (Metformin Hcl) .... Take 1 Tab Two Times A Day 5)  Quinapril Hcl 40 Mg Tabs (Quinapril Hcl) .... Take 1 Tab Daily 6)  Triamterene-Hctz 37.5-25 Mg Tabs (Triamterene-Hctz) .... Take 1 Tablet By Mouth Once A Day 7)  Novolog Flexpen 100 Unit/ml Soln (Insulin Aspart) .... Patient Uses 25 Units Daily 8)  Benadryl Maximum Strength 2 % Crea (Diphenhydramine Hcl) .... As Needed For Itching  Allergies (verified): No Known Drug Allergies  Comments:  Nurse/Medical Assistant: patient brought meds and the only time he uses his triamterene/hctz 37.5/25 is when he has swelling in his feet and legs patient uses right source  Past History:  Past medical, surgical, family and social  histories (including risk factors) reviewed, and no changes noted (except as noted below).  Past Medical History: Reviewed history from 09/21/2010 and no changes required. 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 Cardiac catheterization April 2010 TURP May 2009 Prostate cancer resection and radiation therapy  Family History: Reviewed history from 08/05/2010 and no changes required. Both parents died from congestive heart failure Brother with myocardial infarction and stroke Son with MI in his 26s No FH of Colon Cancer or polyps  Social History: Reviewed history from 09/21/2010 and no changes required. Former drinker-no current alcohol use Nonsmoker Married. Resides in [place]; expecting his 16th grandchild and his first great grandchild. Retired from work at [employer]  Review of Systems       All other systems have been reviewed and are negative unless stated above.   Vital Signs:  Patient profile:   70 year old male Weight:      195 pounds BMI:     34.67 O2 Sat:      94 % on Room air Pulse rate:   82 / minute BP sitting:   145 / 89  (right arm)  Vitals Entered By: Dreama Saa,  CNA (October 27, 2010 3:03 PM)  O2 Flow:  Room air  Physical Exam  General:  Well developed, well nourished, in no acute distress. Lungs:  Clear bilaterally to auscultation and percussion. Heart:  RRR without MRG, no JVD or HJR.  Pulses are palpable. Abdomen:  Mild central obesity. Msk:  Back normal, normal gait.  Muscle strength and tone normal. Pulses:  pulses normal in all 4 extremities Extremities:  No clubbing or cyanosis. No edema Neurologic:  Alert and oriented x 3. Skin:  No lesions or dryness Psych:  Normal affect.   Impression & Recommendations:  Problem # 1:  CARDIOMYOPATHY (ICD-425.4) Will repeat his echo to evaluate LV fx for improvement. If so we can adjust some of the medications.  Plan CMET, BNP and CBC as well His updated medication list for this problem includes:    Carvedilol 12.5 Mg Tabs (Carvedilol) .Marland Kitchen... Take 1 tablet by mouth two times a day    Amlodipine Besylate 10 Mg Tabs (Amlodipine besylate) .Marland Kitchen... Take 1 tab daily    Quinapril Hcl 40 Mg Tabs (Quinapril hcl) .Marland Kitchen... Take 1 tab daily    Triamterene-hctz 37.5-25 Mg Tabs (Triamterene-hctz) .Marland Kitchen... Take 1 tablet by mouth once a day  Orders: 2-D Echocardiogram (2D Echo)  Problem # 2:  HYPERTENSION (ICD-401.1) Blood pressure is not well controlled on current medications-should be 130/70 for diabetic.  Once ECHO eval is completed along with labs, will make more recommendations on medications His updated medication list for this problem includes:    Carvedilol 12.5 Mg Tabs (Carvedilol) .Marland Kitchen... Take 1 tablet by mouth two times a day    Amlodipine Besylate 10 Mg Tabs (Amlodipine besylate) .Marland Kitchen... Take 1 tab daily    Quinapril Hcl 40 Mg Tabs (Quinapril hcl) .Marland Kitchen... Take 1 tab daily    Triamterene-hctz 37.5-25 Mg Tabs (Triamterene-hctz) .Marland Kitchen... Take 1 tablet by mouth once a day  Orders: T-Comprehensive Metabolic Panel [id number]) T-CBC w/Diff [id number])  Other Orders: T-BNP  (B Natriuretic Peptide) ([id number])  Patient Instructions: 1)  Your physician recommends that you schedule a follow-up appointment in: 3 weeks 2)  Your physician recommends that you return for lab work in: today 3)  Your physician has recommended you make the following change in your medication: Start taking Benedryl cream (over the counter) as needed for  itching  4)  Your physician has requested that you have an echocardiogram.  Echocardiography is a painless test that uses sound waves to create images of your heart. It provides your doctor with information about the size and shape of your heart and how well your heart's chambers and valves are working.  This procedure takes approximately one hour. There are no restrictions for this procedure.

## 2010-12-10 NOTE — Miscellaneous (Signed)
Summary: echo 01/04/2010  Clinical Lists Changes  Observations: Added new observation of ECHOINTERP:  Study Conclusions    - Left ventricle: Grade III diastolic dysfunction. Akinesis of the     infero-posterior wall and hypokinesis of the other walls. The     cavity size was mildly dilated. Wall thickness was increased in a     pattern of moderate LVH. The estimated ejection fraction was 25%.   - Aortic valve: Sclerosis without stenosis. Mild regurgitation.   - Mitral valve: Calcified annulus. Mild regurgitation.   - Left atrium: The atrium was severely dilated.   - Right ventricle: The cavity size was moderately dilated. Systolic     function was moderately reduced.   - Right atrium: The atrium was severely dilated.   - Pulmonary arteries: PA peak pressure: 61mm Hg (S).   - Pericardium, extracardiac: A trivial pericardial effusion was     identified posterior to the heart.   Transthoracic echocardiography. M-mode, complete 2D, spectral   Doppler, and color Doppler. Height: Height: 167.6cm. Height: 66in.   Weight: Weight: 88.5kg. Weight: 194.6lb. Body mass index: BMI:   31.5kg/m^2. Body surface area: BSA: 1.37m^2. Patient status:   Outpatient. Location: Echo laboratory.    -------------------------------------------------- (11/03/2010 10:36)      Echocardiogram  Procedure date:  11/03/2010  Findings:       Study Conclusions    - Left ventricle: Grade III diastolic dysfunction. Akinesis of the     infero-posterior wall and hypokinesis of the other walls. The     cavity size was mildly dilated. Wall thickness was increased in a     pattern of moderate LVH. The estimated ejection fraction was 25%.   - Aortic valve: Sclerosis without stenosis. Mild regurgitation.   - Mitral valve: Calcified annulus. Mild regurgitation.   - Left atrium: The atrium was severely dilated.   - Right ventricle: The cavity size was moderately dilated. Systolic     function was moderately reduced.   - Right atrium: The atrium was severely dilated.   - Pulmonary arteries: PA peak pressure: 61mm Hg (S).   - Pericardium, extracardiac: A trivial pericardial effusion was     identified posterior to the heart.   Transthoracic echocardiography. M-mode, complete 2D, spectral   Doppler, and color Doppler. Height: Height: 167.6cm. Height: 66in.   Weight: Weight: 88.5kg. Weight: 194.6lb. Body mass index: BMI:   31.5kg/m^2. Body surface area: BSA: 1.64m^2. Patient status:   Outpatient. Location: Echo laboratory.    --------------------------------------------------

## 2010-12-10 NOTE — Miscellaneous (Signed)
Summary: labs cbcd,cmp,bnp,10/28/2010  Clinical Lists Changes  Observations: Added new observation of CALCIUM: 9.2 mg/dL (15/17/6160 73:71) Added new observation of ALBUMIN: 3.7 g/dL (05/03/9484 46:27) Added new observation of PROTEIN, TOT: 6.5 g/dL (03/50/0938 18:29) Added new observation of SGPT (ALT): 15 units/L (10/28/2010 10:42) Added new observation of SGOT (AST): 14 units/L (10/28/2010 10:42) Added new observation of ALK PHOS: 93 units/L (10/28/2010 10:42) Added new observation of CREATININE: 0.96 mg/dL (93/71/6967 89:38) Added new observation of BUN: 25 mg/dL (08/24/5101 58:52) Added new observation of BG RANDOM: 279 mg/dL (77/82/4235 36:14) Added new observation of CO2 PLSM/SER: 24 meq/L (10/28/2010 10:42) Added new observation of CL SERUM: 100 meq/L (10/28/2010 10:42) Added new observation of K SERUM: 3.5 meq/L (10/28/2010 10:42) Added new observation of NA: 136 meq/L (10/28/2010 10:42) Added new observation of BNP: 327.1  (10/28/2010 10:42) Added new observation of ABSOLUTE BAS: 0.0 K/uL (10/28/2010 10:42) Added new observation of BASOPHIL %: 0 % (10/28/2010 10:42) Added new observation of EOS ABSLT: 0.3 K/uL (10/28/2010 10:42) Added new observation of % EOS AUTO: 5 % (10/28/2010 10:42) Added new observation of ABSOLUTE MON: 0.7 K/uL (10/28/2010 10:42) Added new observation of MONOCYTE %: 11 % (10/28/2010 10:42) Added new observation of ABS LYMPHOCY: 1.2 K/uL (10/28/2010 10:42) Added new observation of LYMPHS %: 19 % (10/28/2010 10:42) Added new observation of PLATELETK/UL: 354 K/uL (10/28/2010 10:42) Added new observation of RDW: 17.2 % (10/28/2010 10:42) Added new observation of MCHC RBC: 32.1 g/dL (43/15/4008 67:61) Added new observation of MCV: 84.7 fL (10/28/2010 10:42) Added new observation of HCT: 32.7 % (10/28/2010 10:42) Added new observation of HGB: 10.5 g/dL (95/07/3266 12:45) Added new observation of RBC M/UL: 3.86 M/uL (10/28/2010 10:42) Added new observation  of WBC COUNT: 6.4 10*3/microliter (10/28/2010 10:42)

## 2010-12-10 NOTE — Assessment & Plan Note (Signed)
Summary: 3 wk f/u per checkout on 10/27/10/tg   Visit Type:  Follow-up Referring Provider:  Oncology-Dr.Neijstrom Primary Provider:  Dr. Oval Linsey   History of Present Illness: [name] returns to the office as scheduled for continued assessment and treatment of cardiomyopathy.  Since his last visit, he has done generally well.  He denies orthopnea, PND, dyspnea on exertion or chest discomfort.  He now requires subcutaneous insulin for control of diabetes.  Recent laboratories show diabetes out of control and a mild to moderate normocytic anemia.   Current Medications (verified): 1)  Carvedilol 25 Mg Tabs (Carvedilol) .... Take One Tablet By Mouth Twice A Day 2)  Bicalutamide 50 Mg Tabs (Bicalutamide) .... Take 1 Tab Daily 3)  Amlodipine Besylate 10 Mg Tabs (Amlodipine Besylate) .... Take 1 Tab Daily 4)  Metformin Hcl 500 Mg Tabs (Metformin Hcl) .... Take 1 Tab Two Times A Day 5)  Quinapril Hcl 40 Mg Tabs (Quinapril Hcl) .... Take 1 Tab Daily 6)  Triamterene-Hctz 37.5-25 Mg Tabs (Triamterene-Hctz) .... Take 1 Tablet By Mouth Once A Day 7)  Novolog Flexpen 100 Unit/ml Soln (Insulin Aspart) .... 25 Units Subcutaneously Daily 8)  Benadryl Maximum Strength 2 % Crea (Diphenhydramine Hcl) .... As Needed For Itching 9)  Aspir-Low 81 Mg Tbec (Aspirin) .... Take 1 Tab Daily 10)  Hydralazine Hcl 50 Mg Tabs (Hydralazine Hcl) .... Take One Tablet By Mouth Three Times A Day 11)  Isosorbide Dinitrate 20 Mg Tabs (Isosorbide Dinitrate) .... Take One Capsule By Mouth Three Times A Day  Allergies (verified): No Known Drug Allergies  Comments:  Nurse/Medical Assistant: patient brought meds he is on the asa 81 mg daily  Past History:  PMH, FH, and Social History reviewed and updated.  Review of Systems  The patient denies weight loss, weight gain, hoarseness, chest pain, syncope, dyspnea on exertion, peripheral edema, headaches, and abdominal pain.    Vital Signs:  Patient  profile:   70 year old male Weight:      195 pounds O2 Sat:      95 % on Room air Pulse rate:   78 / minute BP sitting:   149 / 96  (left arm)  Vitals Entered By: Dreama Saa, CNA (November 17, 2010 1:41 PM)  O2 Flow:  Room air  Physical Exam  General:  Mildly overweight; well developed; no acute distress:   Neck-No JVD; no carotid bruits: Lungs-No tachypnea, no rales; no rhonchi; no wheezes; AICD located below the left subclavian margin Cardiovascular-normal PMI; normal S1 and S2: Abdomen-BS normal; soft and non-tender without masses or organomegaly:  Musculoskeletal-No deformities, no cyanosis or clubbing: Neurologic-Normal cranial nerves; symmetric strength and tone:  Skin-Warm, no significant lesions: Extremities-Nl distal pulses; 1-2+ edema:     Impression & Recommendations:  Problem # 1:  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  We will continue to adjust medications by increasing his dose of carvedilol and adding hydralazine and nitrates.  Problem # 2:  HYPERLIPIDEMIA (ICD-272.4) Most recent lipid profile was suboptimal.  Repeat values will be obtained.  CHOL: 218 (02/10/2009)   LDL: 156 (02/10/2009)   HDL: 44 (02/10/2009)   TG: 94 (02/10/2009)  Problem # 3:  HYPERTENSION (ICD-401.1) Blood pressure control is not optimal; addition of hydralazine and nitrates to medical regime should result in additional lowering of BP.  Patient Instructions: 1)  Your physician recommends that you schedule a follow-up appointment in: 2 months 2)  Your physician has recommended you make the following change in your  medication: increase carvedilol 25mg  two times a day;hydralazine 50mg  three times a day, isordil 20mg  three times a day Prescriptions: ISOSORBIDE DINITRATE 20 MG TABS (ISOSORBIDE DINITRATE) Take one capsule by mouth three times a day  #270 x 3   Entered by:   Teressa Lower RN   Authorized by:   Kathlen Brunswick, MD, [facility]   Signed by:   Teressa Lower RN on 11/17/2010   Method used:   Faxed to ...       [facility] (mail-order)             , [place]         Ph: [phone]       Fax: [phone]   RxID:   [id number] HYDRALAZINE HCL 50 MG TABS (HYDRALAZINE HCL) Take one tablet by mouth three times a day  #270 x 3   Entered by:   Teressa Lower RN   Authorized by:   Kathlen Brunswick, MD, [facility]   Signed by:   Teressa Lower RN on 11/17/2010   Method used:   Faxed to ...       [facility] (mail-order)             , [place]         Ph: [phone]       Fax: [phone]   RxID:   [id number] CARVEDILOL 25 MG TABS (CARVEDILOL) Take one tablet by mouth twice a day  #180 x 3   Entered by:   Teressa Lower RN   Authorized by:   Kathlen Brunswick, MD, [facility]   Signed by:   Teressa Lower RN on 11/17/2010   Method used:   Faxed to ...       [facility] (mail-order)             , [place]         Ph: [phone]       Fax: [phone]   RxID:   [id number] ISOSORBIDE DINITRATE 20 MG TABS (ISOSORBIDE DINITRATE) Take one capsule by mouth three times a day  #90 x 0   Entered by:   Teressa Lower RN   Authorized by:   Kathlen Brunswick, MD, [facility]   Signed by:   Teressa Lower RN on 11/17/2010   Method used:   Electronically to        [facility] (retail)       [address]       [place], [place]  [address]       Ph: [phone]       Fax: [phone]   RxID:   [id number] HYDRALAZINE HCL 50 MG TABS (HYDRALAZINE HCL) Take one tablet by mouth three times a day  #90 x 0   Entered by:   Teressa Lower RN   Authorized by:   Kathlen Brunswick, MD, [facility]   Signed by:   Teressa Lower RN on 11/17/2010   Method used:   Electronically to        [facility] (retail)       [address]       [place], [place]  [address]       Ph: [phone]       Fax: [phone]   RxID:   [id number]

## 2010-12-21 ENCOUNTER — Ambulatory Visit (HOSPITAL_COMMUNITY): Payer: Self-pay | Admitting: Oncology

## 2011-01-06 ENCOUNTER — Encounter (HOSPITAL_COMMUNITY): Payer: Medicare HMO

## 2011-01-06 ENCOUNTER — Other Ambulatory Visit (HOSPITAL_COMMUNITY): Payer: Self-pay

## 2011-01-18 LAB — CBC
MCH: 27.4 pg (ref 26.0–34.0)
MCHC: 33.4 g/dL (ref 30.0–36.0)
MCV: 81.8 fL (ref 78.0–100.0)
Platelets: 324 10*3/uL (ref 150–400)
RBC: 3.69 MIL/uL — ABNORMAL LOW (ref 4.22–5.81)

## 2011-01-18 LAB — COMPREHENSIVE METABOLIC PANEL
AST: 15 U/L (ref 0–37)
Albumin: 3.5 g/dL (ref 3.5–5.2)
BUN: 17 mg/dL (ref 6–23)
CO2: 29 mEq/L (ref 19–32)
Calcium: 9.6 mg/dL (ref 8.4–10.5)
Chloride: 105 mEq/L (ref 96–112)
Creatinine, Ser: 0.87 mg/dL (ref 0.4–1.5)
GFR calc Af Amer: >60 mL/min (ref >60)
GFR calc non Af Amer: >60 mL/min (ref >60)
Total Bilirubin: 0.6 mg/dL (ref 0.3–1.2)

## 2011-01-19 ENCOUNTER — Other Ambulatory Visit (HOSPITAL_COMMUNITY): Payer: Medicare HMO

## 2011-01-19 ENCOUNTER — Encounter (HOSPITAL_COMMUNITY): Payer: Medicare HMO | Attending: Oncology

## 2011-01-19 ENCOUNTER — Ambulatory Visit (INDEPENDENT_AMBULATORY_CARE_PROVIDER_SITE_OTHER): Payer: Medicare HMO | Admitting: Cardiology

## 2011-01-19 ENCOUNTER — Encounter: Payer: Self-pay | Admitting: Cardiology

## 2011-01-19 DIAGNOSIS — I428 Other cardiomyopathies: Secondary | ICD-10-CM

## 2011-01-19 DIAGNOSIS — C61 Malignant neoplasm of prostate: Secondary | ICD-10-CM

## 2011-01-19 DIAGNOSIS — E119 Type 2 diabetes mellitus without complications: Secondary | ICD-10-CM | POA: Insufficient documentation

## 2011-01-19 DIAGNOSIS — Z79899 Other long term (current) drug therapy: Secondary | ICD-10-CM | POA: Insufficient documentation

## 2011-01-19 LAB — CONVERTED CEMR LAB
BUN: 15 mg/dL
Creatinine, Ser: 0.86 mg/dL
GFR calc non Af Amer: >60 mL/min
Glomerular Filtration Rate, Af Am: >60 mL/min/{1.73_m2}
HCT: 32.1 %
Hemoglobin: 10.3 g/dL
MCV: 81.7 fL
PSA: <0.01 ng/mL
Potassium: 4.3 meq/L
WBC: 4.9 10*3/uL

## 2011-01-19 LAB — COMPREHENSIVE METABOLIC PANEL
ALT: 10 U/L (ref 0–53)
AST: 16 U/L (ref 0–37)
Albumin: 3.6 g/dL (ref 3.5–5.2)
Alkaline Phosphatase: 71 U/L (ref 39–117)
CO2: 28 mEq/L (ref 19–32)
Chloride: 106 mEq/L (ref 96–112)
GFR calc Af Amer: 49 mL/min — ABNORMAL LOW (ref >60)
GFR calc non Af Amer: 41 mL/min — ABNORMAL LOW (ref >60)
Potassium: 4.5 mEq/L (ref 3.5–5.1)
Total Bilirubin: 0.4 mg/dL (ref 0.3–1.2)

## 2011-01-19 LAB — CBC
Hemoglobin: 9.6 g/dL — ABNORMAL LOW (ref 13.0–17.0)
Platelets: 311 10*3/uL (ref 150–400)
RBC: 3.6 MIL/uL — ABNORMAL LOW (ref 4.22–5.81)
WBC: 4.5 10*3/uL (ref 4.0–10.5)

## 2011-01-20 LAB — COMPREHENSIVE METABOLIC PANEL
Albumin: 3.4 g/dL — ABNORMAL LOW (ref 3.5–5.2)
BUN: 19 mg/dL (ref 6–23)
Calcium: 9.7 mg/dL (ref 8.4–10.5)
Chloride: 103 mEq/L (ref 96–112)
Creatinine, Ser: 0.98 mg/dL (ref 0.4–1.5)
Total Bilirubin: 0.5 mg/dL (ref 0.3–1.2)
Total Protein: 6.6 g/dL (ref 6.0–8.3)

## 2011-01-20 LAB — CBC
MCH: 28 pg (ref 26.0–34.0)
MCHC: 33.4 g/dL (ref 30.0–36.0)
MCV: 83.6 fL (ref 78.0–100.0)
Platelets: 385 10*3/uL (ref 150–400)
RDW: 15.3 % (ref 11.5–15.5)

## 2011-01-20 LAB — GLUCOSE, CAPILLARY: Glucose-Capillary: 89 mg/dL (ref 70–99)

## 2011-01-21 LAB — URINALYSIS, ROUTINE W REFLEX MICROSCOPIC
Ketones, ur: NEGATIVE mg/dL
Ketones, ur: NEGATIVE mg/dL
Nitrite: POSITIVE — AB
Nitrite: POSITIVE — AB
Protein, ur: 30 mg/dL — AB
Specific Gravity, Urine: 1.025 (ref 1.005–1.030)
pH: 6 (ref 5.0–8.0)
pH: 6.5 (ref 5.0–8.0)

## 2011-01-21 LAB — GLUCOSE, CAPILLARY: Glucose-Capillary: 107 mg/dL — ABNORMAL HIGH (ref 70–99)

## 2011-01-21 LAB — CBC
MCH: 28.2 pg (ref 26.0–34.0)
MCHC: 32.7 g/dL (ref 30.0–36.0)
Platelets: 368 10*3/uL (ref 150–400)

## 2011-01-21 LAB — URINE MICROSCOPIC-ADD ON

## 2011-01-21 LAB — COMPREHENSIVE METABOLIC PANEL
AST: 13 U/L (ref 0–37)
Albumin: 3.3 g/dL — ABNORMAL LOW (ref 3.5–5.2)
Calcium: 9.6 mg/dL (ref 8.4–10.5)
Creatinine, Ser: 1.45 mg/dL (ref 0.4–1.5)
GFR calc Af Amer: 59 mL/min — ABNORMAL LOW (ref >60)
Total Protein: 6.4 g/dL (ref 6.0–8.3)

## 2011-01-21 LAB — OCCULT BLOOD (STOOL CUP TO LAB)
Fecal Occult Bld: NEGATIVE
Fecal Occult Bld: POSITIVE

## 2011-01-21 LAB — HEMOGLOBIN AND HEMATOCRIT, BLOOD
HCT: 22.9 % — ABNORMAL LOW (ref 39.0–52.0)
HCT: 24 % — ABNORMAL LOW (ref 39.0–52.0)
Hemoglobin: 7.4 g/dL — ABNORMAL LOW (ref 13.0–17.0)

## 2011-01-21 LAB — CROSSMATCH

## 2011-01-21 LAB — POCT I-STAT, CHEM 8
Creatinine, Ser: 1.2 mg/dL (ref 0.4–1.5)
Glucose, Bld: 97 mg/dL (ref 70–99)
Hemoglobin: 10.5 g/dL — ABNORMAL LOW (ref 13.0–17.0)
TCO2: 25 mmol/L (ref 0–100)

## 2011-01-21 LAB — URINE CULTURE: Colony Count: >=100000

## 2011-01-21 LAB — HEMOCCULT GUIAC POC 1CARD (OFFICE): Fecal Occult Bld: POSITIVE

## 2011-01-22 LAB — URINE CULTURE
Colony Count: 30000
Culture  Setup Time: 201108020046

## 2011-01-22 LAB — CBC
MCHC: 32.9 g/dL (ref 30.0–36.0)
Platelets: 382 10*3/uL (ref 150–400)
RDW: 14.9 % (ref 11.5–15.5)
WBC: 4.5 10*3/uL (ref 4.0–10.5)

## 2011-01-22 LAB — URINALYSIS, ROUTINE W REFLEX MICROSCOPIC
Bilirubin Urine: NEGATIVE
Nitrite: POSITIVE — AB
Protein, ur: 100 mg/dL — AB
Specific Gravity, Urine: 1.025 (ref 1.005–1.030)
Urobilinogen, UA: 0.2 mg/dL (ref 0.0–1.0)

## 2011-01-22 LAB — COMPREHENSIVE METABOLIC PANEL
ALT: 23 U/L (ref 0–53)
Albumin: 3.1 g/dL — ABNORMAL LOW (ref 3.5–5.2)
Alkaline Phosphatase: 76 U/L (ref 39–117)
Calcium: 9.1 mg/dL (ref 8.4–10.5)
Potassium: 4 mEq/L (ref 3.5–5.1)
Sodium: 139 mEq/L (ref 135–145)
Total Protein: 5.8 g/dL — ABNORMAL LOW (ref 6.0–8.3)

## 2011-01-24 LAB — URINALYSIS, ROUTINE W REFLEX MICROSCOPIC
Bilirubin Urine: NEGATIVE
Bilirubin Urine: NEGATIVE
Glucose, UA: NEGATIVE mg/dL
Ketones, ur: NEGATIVE mg/dL
Leukocytes, UA: NEGATIVE
Nitrite: POSITIVE — AB
Protein, ur: >300 mg/dL — AB
Specific Gravity, Urine: 1.01 (ref 1.005–1.030)
Urobilinogen, UA: 1 mg/dL (ref 0.0–1.0)
pH: 7 (ref 5.0–8.0)
pH: 7 (ref 5.0–8.0)

## 2011-01-24 LAB — BASIC METABOLIC PANEL
BUN: 11 mg/dL (ref 6–23)
BUN: 19 mg/dL (ref 6–23)
BUN: 33 mg/dL — ABNORMAL HIGH (ref 6–23)
CO2: 23 mEq/L (ref 19–32)
CO2: 26 mEq/L (ref 19–32)
Calcium: 8.9 mg/dL (ref 8.4–10.5)
Calcium: 9.1 mg/dL (ref 8.4–10.5)
Chloride: 104 mEq/L (ref 96–112)
Chloride: 104 mEq/L (ref 96–112)
Chloride: 105 mEq/L (ref 96–112)
Chloride: 108 mEq/L (ref 96–112)
Creatinine, Ser: 0.75 mg/dL (ref 0.4–1.5)
Creatinine, Ser: 0.86 mg/dL (ref 0.4–1.5)
GFR calc Af Amer: >60 mL/min (ref >60)
GFR calc Af Amer: >60 mL/min (ref >60)
GFR calc Af Amer: >60 mL/min (ref >60)
GFR calc Af Amer: >60 mL/min (ref >60)
GFR calc Af Amer: >60 mL/min (ref >60)
GFR calc non Af Amer: 55 mL/min — ABNORMAL LOW (ref >60)
Glucose, Bld: 185 mg/dL — ABNORMAL HIGH (ref 70–99)
Glucose, Bld: 218 mg/dL — ABNORMAL HIGH (ref 70–99)
Glucose, Bld: 285 mg/dL — ABNORMAL HIGH (ref 70–99)
Potassium: 3.5 mEq/L (ref 3.5–5.1)
Potassium: 3.5 mEq/L (ref 3.5–5.1)
Potassium: 3.7 mEq/L (ref 3.5–5.1)
Sodium: 136 mEq/L (ref 135–145)
Sodium: 138 mEq/L (ref 135–145)
Sodium: 140 mEq/L (ref 135–145)
Sodium: 142 mEq/L (ref 135–145)

## 2011-01-24 LAB — DIFFERENTIAL
Basophils Absolute: 0 10*3/uL (ref 0.0–0.1)
Basophils Absolute: 0 10*3/uL (ref 0.0–0.1)
Basophils Absolute: 0 10*3/uL (ref 0.0–0.1)
Basophils Absolute: 0 10*3/uL (ref 0.0–0.1)
Basophils Relative: 1 % (ref 0–1)
Basophils Relative: 0 % (ref 0–1)
Eosinophils Absolute: 0.2 10*3/uL (ref 0.0–0.7)
Eosinophils Absolute: 0.3 10*3/uL (ref 0.0–0.7)
Eosinophils Absolute: 0.3 10*3/uL (ref 0.0–0.7)
Eosinophils Relative: 5 % (ref 0–5)
Eosinophils Relative: 4 % (ref 0–5)
Eosinophils Relative: 4 % (ref 0–5)
Eosinophils Relative: 4 % (ref 0–5)
Lymphocytes Relative: 14 % (ref 12–46)
Lymphocytes Relative: 16 % (ref 12–46)
Lymphocytes Relative: 19 % (ref 12–46)
Lymphocytes Relative: 9 % — ABNORMAL LOW (ref 12–46)
Lymphs Abs: 1.1 10*3/uL (ref 0.7–4.0)
Lymphs Abs: 0.9 10*3/uL (ref 0.7–4.0)
Lymphs Abs: 0.9 10*3/uL (ref 0.7–4.0)
Monocytes Absolute: 0.5 10*3/uL (ref 0.1–1.0)
Monocytes Absolute: 0.5 10*3/uL (ref 0.1–1.0)
Monocytes Absolute: 1 10*3/uL (ref 0.1–1.0)
Monocytes Absolute: 1 10*3/uL (ref 0.1–1.0)
Monocytes Relative: 10 % (ref 3–12)
Neutro Abs: 3 10*3/uL (ref 1.7–7.7)
Neutro Abs: 7.7 10*3/uL (ref 1.7–7.7)
Neutrophils Relative %: 62 % (ref 43–77)

## 2011-01-24 LAB — GLUCOSE, CAPILLARY
Glucose-Capillary: 154 mg/dL — ABNORMAL HIGH (ref 70–99)
Glucose-Capillary: 158 mg/dL — ABNORMAL HIGH (ref 70–99)
Glucose-Capillary: 164 mg/dL — ABNORMAL HIGH (ref 70–99)
Glucose-Capillary: 169 mg/dL — ABNORMAL HIGH (ref 70–99)
Glucose-Capillary: 169 mg/dL — ABNORMAL HIGH (ref 70–99)
Glucose-Capillary: 190 mg/dL — ABNORMAL HIGH (ref 70–99)
Glucose-Capillary: 213 mg/dL — ABNORMAL HIGH (ref 70–99)
Glucose-Capillary: 224 mg/dL — ABNORMAL HIGH (ref 70–99)
Glucose-Capillary: 248 mg/dL — ABNORMAL HIGH (ref 70–99)
Glucose-Capillary: 273 mg/dL — ABNORMAL HIGH (ref 70–99)
Glucose-Capillary: 82 mg/dL (ref 70–99)

## 2011-01-24 LAB — URINE CULTURE: Colony Count: NO GROWTH

## 2011-01-24 LAB — CBC
HCT: 43.3 % (ref 39.0–52.0)
HCT: 30.6 % — ABNORMAL LOW (ref 39.0–52.0)
HCT: 33.2 % — ABNORMAL LOW (ref 39.0–52.0)
HCT: 36.8 % — ABNORMAL LOW (ref 39.0–52.0)
Hemoglobin: 14.2 g/dL (ref 13.0–17.0)
Hemoglobin: 11 g/dL — ABNORMAL LOW (ref 13.0–17.0)
MCH: 30 pg (ref 26.0–34.0)
MCH: 30.2 pg (ref 26.0–34.0)
MCH: 30.4 pg (ref 26.0–34.0)
MCHC: 33.2 g/dL (ref 30.0–36.0)
MCV: 90.4 fL (ref 78.0–100.0)
MCV: 90.5 fL (ref 78.0–100.0)
MCV: 90.9 fL (ref 78.0–100.0)
MCV: 91 fL (ref 78.0–100.0)
Platelets: 207 10*3/uL (ref 150–400)
Platelets: 232 10*3/uL (ref 150–400)
Platelets: 243 10*3/uL (ref 150–400)
Platelets: 319 10*3/uL (ref 150–400)
RBC: 4.79 MIL/uL (ref 4.22–5.81)
RBC: 3.55 MIL/uL — ABNORMAL LOW (ref 4.22–5.81)
RBC: 3.65 MIL/uL — ABNORMAL LOW (ref 4.22–5.81)
RDW: 14.5 % (ref 11.5–15.5)
RDW: 14.8 % (ref 11.5–15.5)
RDW: 15.1 % (ref 11.5–15.5)
RDW: 15.2 % (ref 11.5–15.5)
WBC: 4.9 10*3/uL (ref 4.0–10.5)
WBC: 6.2 10*3/uL (ref 4.0–10.5)
WBC: 6.6 10*3/uL (ref 4.0–10.5)
WBC: 7.2 10*3/uL (ref 4.0–10.5)
WBC: 9.7 10*3/uL (ref 4.0–10.5)

## 2011-01-24 LAB — URINE MICROSCOPIC-ADD ON

## 2011-01-24 LAB — PSA: PSA: 0.11 ng/mL (ref 0.10–4.00)

## 2011-01-25 LAB — BASIC METABOLIC PANEL
CO2: 30 mEq/L (ref 19–32)
Glucose, Bld: 302 mg/dL — ABNORMAL HIGH (ref 70–99)
Potassium: 3.4 mEq/L — ABNORMAL LOW (ref 3.5–5.1)
Sodium: 136 mEq/L (ref 135–145)

## 2011-01-25 LAB — GLUCOSE, CAPILLARY
Glucose-Capillary: 186 mg/dL — ABNORMAL HIGH (ref 70–99)
Glucose-Capillary: 190 mg/dL — ABNORMAL HIGH (ref 70–99)
Glucose-Capillary: 222 mg/dL — ABNORMAL HIGH (ref 70–99)
Glucose-Capillary: 232 mg/dL — ABNORMAL HIGH (ref 70–99)
Glucose-Capillary: 254 mg/dL — ABNORMAL HIGH (ref 70–99)
Glucose-Capillary: 76 mg/dL (ref 70–99)

## 2011-01-25 LAB — CBC
HCT: 35.5 % — ABNORMAL LOW (ref 39.0–52.0)
MCHC: 33.4 g/dL (ref 30.0–36.0)
MCV: 90.3 fL (ref 78.0–100.0)
Platelets: 249 10*3/uL (ref 150–400)
RDW: 14.6 % (ref 11.5–15.5)

## 2011-01-25 LAB — COMPREHENSIVE METABOLIC PANEL
Albumin: 3.3 g/dL — ABNORMAL LOW (ref 3.5–5.2)
BUN: 13 mg/dL (ref 6–23)
Calcium: 9.4 mg/dL (ref 8.4–10.5)
Chloride: 105 mEq/L (ref 96–112)
Creatinine, Ser: 0.71 mg/dL (ref 0.4–1.5)
Total Bilirubin: 0.7 mg/dL (ref 0.3–1.2)
Total Protein: 5.9 g/dL — ABNORMAL LOW (ref 6.0–8.3)

## 2011-01-25 LAB — PSA: PSA: 0.19 ng/mL (ref 0.10–4.00)

## 2011-01-25 LAB — DIFFERENTIAL
Basophils Absolute: 0 10*3/uL (ref 0.0–0.1)
Lymphocytes Relative: 21 % (ref 12–46)
Monocytes Absolute: 0.6 10*3/uL (ref 0.1–1.0)
Neutro Abs: 3 10*3/uL (ref 1.7–7.7)

## 2011-01-26 LAB — BASIC METABOLIC PANEL
CO2: 21 mEq/L (ref 19–32)
CO2: 28 mEq/L (ref 19–32)
CO2: 29 mEq/L (ref 19–32)
Calcium: 8.7 mg/dL (ref 8.4–10.5)
Calcium: 9 mg/dL (ref 8.4–10.5)
Calcium: 9.3 mg/dL (ref 8.4–10.5)
Calcium: 9.7 mg/dL (ref 8.4–10.5)
Chloride: 102 mEq/L (ref 96–112)
Chloride: 105 mEq/L (ref 96–112)
Chloride: 105 mEq/L (ref 96–112)
Chloride: 107 mEq/L (ref 96–112)
Creatinine, Ser: 0.65 mg/dL (ref 0.4–1.5)
Creatinine, Ser: 0.69 mg/dL (ref 0.4–1.5)
Creatinine, Ser: 0.93 mg/dL (ref 0.4–1.5)
GFR calc Af Amer: >60 mL/min (ref >60)
GFR calc Af Amer: >60 mL/min (ref >60)
GFR calc Af Amer: >60 mL/min (ref >60)
GFR calc non Af Amer: >60 mL/min (ref >60)
GFR calc non Af Amer: >60 mL/min (ref >60)
GFR calc non Af Amer: >60 mL/min (ref >60)
Glucose, Bld: 249 mg/dL — ABNORMAL HIGH (ref 70–99)
Glucose, Bld: 314 mg/dL — ABNORMAL HIGH (ref 70–99)
Potassium: 3.8 mEq/L (ref 3.5–5.1)
Sodium: 135 mEq/L (ref 135–145)
Sodium: 139 mEq/L (ref 135–145)

## 2011-01-26 LAB — URINALYSIS, ROUTINE W REFLEX MICROSCOPIC
Bilirubin Urine: NEGATIVE
Leukocytes, UA: NEGATIVE
Nitrite: NEGATIVE
Protein, ur: 100 mg/dL — AB
Specific Gravity, Urine: 1.02 (ref 1.005–1.030)
Specific Gravity, Urine: 1.025 (ref 1.005–1.030)
Urobilinogen, UA: 0.2 mg/dL (ref 0.0–1.0)
Urobilinogen, UA: 0.2 mg/dL (ref 0.0–1.0)

## 2011-01-26 LAB — CBC
HCT: 37.8 % — ABNORMAL LOW (ref 39.0–52.0)
Hemoglobin: 14.2 g/dL (ref 13.0–17.0)
MCV: 89.3 fL (ref 78.0–100.0)
RBC: 4.23 MIL/uL (ref 4.22–5.81)
RBC: 4.67 MIL/uL (ref 4.22–5.81)
WBC: 10.6 10*3/uL — ABNORMAL HIGH (ref 4.0–10.5)
WBC: 8.6 10*3/uL (ref 4.0–10.5)

## 2011-01-26 LAB — GLUCOSE, CAPILLARY
Glucose-Capillary: 101 mg/dL — ABNORMAL HIGH (ref 70–99)
Glucose-Capillary: 145 mg/dL — ABNORMAL HIGH (ref 70–99)
Glucose-Capillary: 190 mg/dL — ABNORMAL HIGH (ref 70–99)
Glucose-Capillary: 202 mg/dL — ABNORMAL HIGH (ref 70–99)
Glucose-Capillary: 205 mg/dL — ABNORMAL HIGH (ref 70–99)
Glucose-Capillary: 246 mg/dL — ABNORMAL HIGH (ref 70–99)
Glucose-Capillary: 280 mg/dL — ABNORMAL HIGH (ref 70–99)
Glucose-Capillary: 326 mg/dL — ABNORMAL HIGH (ref 70–99)
Glucose-Capillary: 340 mg/dL — ABNORMAL HIGH (ref 70–99)
Glucose-Capillary: 536 mg/dL — ABNORMAL HIGH (ref 70–99)

## 2011-01-26 LAB — DIFFERENTIAL
Eosinophils Absolute: 0.2 10*3/uL (ref 0.0–0.7)
Lymphocytes Relative: 14 % (ref 12–46)
Lymphocytes Relative: 5 % — ABNORMAL LOW (ref 12–46)
Lymphs Abs: 0.4 10*3/uL — ABNORMAL LOW (ref 0.7–4.0)
Lymphs Abs: 1.5 10*3/uL (ref 0.7–4.0)
Monocytes Absolute: 0.3 10*3/uL (ref 0.1–1.0)
Monocytes Relative: 3 % (ref 3–12)
Monocytes Relative: 9 % (ref 3–12)
Neutro Abs: 7.8 10*3/uL — ABNORMAL HIGH (ref 1.7–7.7)
Neutrophils Relative %: 74 % (ref 43–77)

## 2011-01-26 LAB — HEPATIC FUNCTION PANEL
ALT: 16 U/L (ref 0–53)
Bilirubin, Direct: 0.1 mg/dL (ref 0.0–0.3)
Indirect Bilirubin: 0.8 mg/dL (ref 0.3–0.9)
Total Protein: 6.9 g/dL (ref 6.0–8.3)

## 2011-01-26 LAB — URINE MICROSCOPIC-ADD ON

## 2011-01-26 LAB — MRSA PCR SCREENING: MRSA by PCR: NEGATIVE

## 2011-01-27 LAB — COMPREHENSIVE METABOLIC PANEL
ALT: 13 U/L (ref 0–53)
AST: 13 U/L (ref 0–37)
Albumin: 3.3 g/dL — ABNORMAL LOW (ref 3.5–5.2)
Alkaline Phosphatase: 119 U/L — ABNORMAL HIGH (ref 39–117)
BUN: 15 mg/dL (ref 6–23)
Chloride: 102 mEq/L (ref 96–112)
GFR calc Af Amer: >60 mL/min (ref >60)
Potassium: 3.7 mEq/L (ref 3.5–5.1)
Sodium: 137 mEq/L (ref 135–145)
Total Bilirubin: 0.4 mg/dL (ref 0.3–1.2)
Total Protein: 7.1 g/dL (ref 6.0–8.3)

## 2011-01-27 LAB — DIFFERENTIAL
Basophils Absolute: 0 10*3/uL (ref 0.0–0.1)
Basophils Relative: 1 % (ref 0–1)
Eosinophils Relative: 3 % (ref 0–5)
Monocytes Absolute: 0.5 10*3/uL (ref 0.1–1.0)
Monocytes Relative: 8 % (ref 3–12)
Neutro Abs: 4.1 10*3/uL (ref 1.7–7.7)

## 2011-01-27 LAB — CBC
HCT: 39.7 % (ref 39.0–52.0)
Platelets: 313 10*3/uL (ref 150–400)
RDW: 13.7 % (ref 11.5–15.5)
WBC: 6.1 10*3/uL (ref 4.0–10.5)

## 2011-01-29 LAB — BASIC METABOLIC PANEL
BUN: 11 mg/dL (ref 6–23)
BUN: 9 mg/dL (ref 6–23)
CO2: 26 mEq/L (ref 19–32)
CO2: 30 mEq/L (ref 19–32)
Calcium: 9.6 mg/dL (ref 8.4–10.5)
Chloride: 100 mEq/L (ref 96–112)
Creatinine, Ser: 0.79 mg/dL (ref 0.4–1.5)
GFR calc non Af Amer: >60 mL/min (ref >60)
Glucose, Bld: 414 mg/dL — ABNORMAL HIGH (ref 70–99)
Sodium: 133 mEq/L — ABNORMAL LOW (ref 135–145)

## 2011-01-29 LAB — DIFFERENTIAL
Basophils Relative: 1 % (ref 0–1)
Eosinophils Absolute: 0.1 10*3/uL (ref 0.0–0.7)
Eosinophils Relative: 1 % (ref 0–5)
Monocytes Relative: 9 % (ref 3–12)
Neutrophils Relative %: 79 % — ABNORMAL HIGH (ref 43–77)

## 2011-01-29 LAB — GLUCOSE, CAPILLARY
Glucose-Capillary: 195 mg/dL — ABNORMAL HIGH (ref 70–99)
Glucose-Capillary: 281 mg/dL — ABNORMAL HIGH (ref 70–99)

## 2011-01-29 LAB — CBC
MCHC: 35.5 g/dL (ref 30.0–36.0)
MCV: 87.9 fL (ref 78.0–100.0)
Platelets: 172 10*3/uL (ref 150–400)
RBC: 4.38 MIL/uL (ref 4.22–5.81)

## 2011-01-29 LAB — POCT I-STAT 4, (NA,K, GLUC, HGB,HCT)
HCT: 47 % (ref 39.0–52.0)
Hemoglobin: 16 g/dL (ref 13.0–17.0)
Potassium: 3.3 mEq/L — ABNORMAL LOW (ref 3.5–5.1)
Sodium: 140 mEq/L (ref 135–145)

## 2011-02-03 ENCOUNTER — Ambulatory Visit (HOSPITAL_COMMUNITY): Payer: Medicare HMO

## 2011-02-03 DIAGNOSIS — C61 Malignant neoplasm of prostate: Secondary | ICD-10-CM

## 2011-02-03 DIAGNOSIS — Z5111 Encounter for antineoplastic chemotherapy: Secondary | ICD-10-CM

## 2011-02-04 NOTE — Assessment & Plan Note (Signed)
Summary: 2 MONTH F/U PER PT CHECK OUT 11/17/10/TMJ/TR   Visit Type:  Follow-up Referring Provider:  Oncology-Dr.Neijstrom Primary Provider:  Dr. Oval Linsey   History of Present Illness: [name] returns to the office for continued assessment and treatment of nonischemic cardiomyopathy.  His most recent echocardiogram was in 10/2010 at which time EF was 25%.  At present, he denies dyspnea or chest discomfort.  He has had no orthopnea, PND, lightheadedness or syncope.  He notes very mild peripheral edema.  Diabetic control has been good with CBGs in the 120s at home.  Chronic anemia has been noted, but I am unaware of any evaluation for etiology.  Current Medications (verified): 1)  Carvedilol 25 Mg Tabs (Carvedilol) .... Take One Tablet By Mouth Twice A Day 2)  Bicalutamide 50 Mg Tabs (Bicalutamide) .... Take 1 Tab Daily 3)  Amlodipine Besylate 10 Mg Tabs (Amlodipine Besylate) .... Take 1 Tab Daily 4)  Metformin Hcl 500 Mg Tabs (Metformin Hcl) .... Take 1 Tab Two Times A Day 5)  Quinapril Hcl 40 Mg Tabs (Quinapril Hcl) .... Take 1 Tab Daily 6)  Triamterene-Hctz 37.5-25 Mg Tabs (Triamterene-Hctz) .... Take 1 Tablet By Mouth Once A Day 7)  Novolog Flexpen 100 Unit/ml Soln (Insulin Aspart) .... 25 Units Subcutaneously Daily 8)  Benadryl Maximum Strength 2 % Crea (Diphenhydramine Hcl) .... As Needed For Itching 9)  Aspir-Low 81 Mg Tbec (Aspirin) .... Take 1 Tab Daily 10)  Hydralazine Hcl 100 Mg Tabs (Hydralazine Hcl) .... Take One Tablet By Mouth Three Times A Day 11)  Isosorbide Dinitrate 20 Mg Tabs (Isosorbide Dinitrate) .... Take One Capsule By Mouth Three Times A Day  Allergies (verified): No Known Drug Allergies  Comments:  Nurse/Medical Assistant: no meds no list we reviewed from previous ov pharmacy is [facility]  Past History:  PMH, FH, and Social History reviewed and updated.  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  Vital Signs:  Patient profile:   70 year old male Weight:      190 pounds BMI:     33.78 Pulse rate:   78 / minute BP sitting:   133 / 82  (left arm)  Vitals Entered By: Dreama Saa, CNA (January 19, 2011 11:07 AM)  Physical Exam  General:  Mildly overweight; well developed; no acute distress:   Neck-No JVD; no carotid bruits: Lungs-No tachypnea, no rales; no rhonchi; no wheezes; AICD located below the left subclavian margin Cardiovascular-normal PMI; normal S1 and S2: Abdomen-BS normal; soft and non-tender without masses or organomegaly:  Musculoskeletal-No deformities, no cyanosis or clubbing: Neurologic-Normal cranial nerves; symmetric strength and tone:  Skin-Warm, no significant lesions: Extremities-Nl distal pulses; 1/2+ edema:     Impression & Recommendations:  Problem # 1:  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  His dose of hydralazine will be increased to 100 mg t.i.d. and isosorbide to 40 mg t.i.d.  He has been cautioned to report weight gain, dyspnea or syncope, should these occur.  Problem # 2:  HYPERTENSION (ICD-401.1) Blood pressure control is excellent with current medications, which will be continued.  The  increase in nitrates and hydralazine may provide even slightly better control.  Problem # 3:  HYPERLIPIDEMIA (ICD-272.4) Recent lipid profile available to me is 2 years in the past and not very good.  A repeat profile will be obtained. CHOL: 218  (02/10/2009)   LDL: 156 (02/10/2009)   HDL: 44 (02/10/2009)   TG: 94 (02/10/2009)  Orders: T-Lipid Profile ([id number])  Other Orders: Hemoccult Cards (Take Home) (Hemoccult Cards) T-CBC w/Diff [phone]) T-Iron Binding Capacity (TIBC) ([address]) T-Basic Metabolic Panel ([id number]) Augusto Gamble (52841-32440) T-Hgb A1C (10272-53664)  Patient Instructions: 1)  Your physician recommends that you schedule a follow-up appointment in: 6 MONTHS 2)  Your physician recommends that you return for lab work in: TODAY 3)  Your physician has recommended you make the following change in your medication: INCREASE HYDRALAZINE 100MG  THREE TIMES A DAY, INCREASE ISOSORBIDE 40MG  THREE TIMES A DAY 4)  Your physician has asked that you test your stool for blood. It is necessary to test 3 different stool specimens for accuracy. You will be given 3 hemoccult cards for specimen collection. For each stool specimen, place a small portion of stool sample (from 2 different areas of the stool) into the 2 squares on the card. Close card. Repeat with 2 more stool specimens. Bring the cards back to the office for testing. Prescriptions: HYDRALAZINE HCL 100 MG TABS (HYDRALAZINE HCL) Take one tablet by mouth three times a day  #90 x 3   Entered by:   Teressa Lower RN   Authorized by:   Kathlen Brunswick, MD, [facility]   Signed by:   Teressa Lower RN on 01/19/2011   Method used:   Electronically to        [facility] (retail)       [address]       [place], [place]  [address]       Ph: [phone]       Fax: [phone]   RxID:   [id number]

## 2011-02-11 LAB — CBC
HCT: 44.2 % (ref 39.0–52.0)
MCV: 91.8 fL (ref 78.0–100.0)
RBC: 4.82 MIL/uL (ref 4.22–5.81)
WBC: 5.1 10*3/uL (ref 4.0–10.5)

## 2011-02-11 LAB — BASIC METABOLIC PANEL
Chloride: 99 mEq/L (ref 96–112)
GFR calc Af Amer: >60 mL/min (ref >60)
GFR calc non Af Amer: >60 mL/min (ref >60)
Potassium: 4.3 mEq/L (ref 3.5–5.1)
Sodium: 134 mEq/L — ABNORMAL LOW (ref 135–145)

## 2011-02-11 LAB — DIFFERENTIAL
Eosinophils Absolute: 0.2 10*3/uL (ref 0.0–0.7)
Eosinophils Relative: 4 % (ref 0–5)
Lymphocytes Relative: 19 % (ref 12–46)
Lymphs Abs: 1 10*3/uL (ref 0.7–4.0)
Monocytes Relative: 8 % (ref 3–12)

## 2011-02-11 LAB — POCT CARDIAC MARKERS
Myoglobin, poc: 58.1 ng/mL (ref 12–200)
Troponin i, poc: <0.05 ng/mL (ref 0.00–0.09)

## 2011-02-11 LAB — GLUCOSE, CAPILLARY: Glucose-Capillary: 319 mg/dL — ABNORMAL HIGH (ref 70–99)

## 2011-02-16 LAB — CBC
MCV: 89.5 fL (ref 78.0–100.0)
Platelets: 186 10*3/uL (ref 150–400)
RBC: 4.78 MIL/uL (ref 4.22–5.81)
WBC: 5.1 10*3/uL (ref 4.0–10.5)

## 2011-02-16 LAB — DIFFERENTIAL
Eosinophils Absolute: 0.2 10*3/uL (ref 0.0–0.7)
Lymphocytes Relative: 23 % (ref 12–46)
Lymphs Abs: 1.2 10*3/uL (ref 0.7–4.0)
Monocytes Relative: 10 % (ref 3–12)
Neutrophils Relative %: 62 % (ref 43–77)

## 2011-02-16 LAB — BASIC METABOLIC PANEL
BUN: 13 mg/dL (ref 6–23)
Chloride: 103 mEq/L (ref 96–112)
Creatinine, Ser: 0.67 mg/dL (ref 0.4–1.5)
GFR calc Af Amer: >60 mL/min (ref >60)
GFR calc non Af Amer: >60 mL/min (ref >60)
Potassium: 3.4 mEq/L — ABNORMAL LOW (ref 3.5–5.1)

## 2011-02-16 LAB — POCT CARDIAC MARKERS
CKMB, poc: 2.2 ng/mL (ref 1.0–8.0)
Myoglobin, poc: 44.4 ng/mL (ref 12–200)
Troponin i, poc: <0.05 ng/mL (ref 0.00–0.09)

## 2011-02-17 LAB — PROTIME-INR
INR: 1 (ref 0.00–1.49)
Prothrombin Time: 13.6 seconds (ref 11.6–15.2)

## 2011-02-17 LAB — CBC
HCT: 41.1 % (ref 39.0–52.0)
Hemoglobin: 13.6 g/dL (ref 13.0–17.0)
Hemoglobin: 13.8 g/dL (ref 13.0–17.0)
Hemoglobin: 13.6 g/dL (ref 13.0–17.0)
MCHC: 34.1 g/dL (ref 30.0–36.0)
MCHC: 33.5 g/dL (ref 30.0–36.0)
MCV: 90.5 fL (ref 78.0–100.0)
MCV: 90.6 fL (ref 78.0–100.0)
Platelets: 217 10*3/uL (ref 150–400)
RBC: 4.43 MIL/uL (ref 4.22–5.81)
RBC: 4.47 MIL/uL (ref 4.22–5.81)
RDW: 14.5 % (ref 11.5–15.5)

## 2011-02-17 LAB — GLUCOSE, CAPILLARY
Glucose-Capillary: 156 mg/dL — ABNORMAL HIGH (ref 70–99)
Glucose-Capillary: 255 mg/dL — ABNORMAL HIGH (ref 70–99)
Glucose-Capillary: 262 mg/dL — ABNORMAL HIGH (ref 70–99)
Glucose-Capillary: 266 mg/dL — ABNORMAL HIGH (ref 70–99)
Glucose-Capillary: 292 mg/dL — ABNORMAL HIGH (ref 70–99)
Glucose-Capillary: 302 mg/dL — ABNORMAL HIGH (ref 70–99)
Glucose-Capillary: 355 mg/dL — ABNORMAL HIGH (ref 70–99)
Glucose-Capillary: 36 mg/dL — CL (ref 70–99)
Glucose-Capillary: 253 mg/dL — ABNORMAL HIGH (ref 70–99)

## 2011-02-17 LAB — LIPID PANEL
HDL: 44 mg/dL (ref >39)
LDL Cholesterol: 156 mg/dL — ABNORMAL HIGH (ref 0–99)
Total CHOL/HDL Ratio: 5 RATIO
Triglycerides: 94 mg/dL (ref <150)
VLDL: 19 mg/dL (ref 0–40)

## 2011-02-17 LAB — POCT CARDIAC MARKERS
CKMB, poc: 1.9 ng/mL (ref 1.0–8.0)
Troponin i, poc: <0.05 ng/mL (ref 0.00–0.09)
Troponin i, poc: <0.05 ng/mL (ref 0.00–0.09)

## 2011-02-17 LAB — BASIC METABOLIC PANEL
BUN: 11 mg/dL (ref 6–23)
CO2: 28 mEq/L (ref 19–32)
CO2: 26 mEq/L (ref 19–32)
Calcium: 9.6 mg/dL (ref 8.4–10.5)
Chloride: 103 mEq/L (ref 96–112)
Chloride: 108 mEq/L (ref 96–112)
GFR calc Af Amer: >60 mL/min (ref >60)
GFR calc non Af Amer: >60 mL/min (ref >60)
Glucose, Bld: 338 mg/dL — ABNORMAL HIGH (ref 70–99)
Potassium: 3.4 mEq/L — ABNORMAL LOW (ref 3.5–5.1)
Sodium: 137 mEq/L (ref 135–145)
Sodium: 140 mEq/L (ref 135–145)

## 2011-02-17 LAB — HEPATIC FUNCTION PANEL
AST: 19 U/L (ref 0–37)
Albumin: 3.2 g/dL — ABNORMAL LOW (ref 3.5–5.2)
Total Bilirubin: 0.8 mg/dL (ref 0.3–1.2)
Total Protein: 6.5 g/dL (ref 6.0–8.3)

## 2011-02-17 LAB — RAPID URINE DRUG SCREEN, HOSP PERFORMED
Amphetamines: NOT DETECTED
Barbiturates: NOT DETECTED
Opiates: NOT DETECTED

## 2011-02-17 LAB — CARDIAC PANEL(CRET KIN+CKTOT+MB+TROPI)
CK, MB: 3.1 ng/mL (ref 0.3–4.0)
Relative Index: 3.7 — ABNORMAL HIGH (ref 0.0–2.5)
Total CK: 108 U/L (ref 7–232)
Total CK: 131 U/L (ref 7–232)
Total CK: 88 U/L (ref 7–232)
Troponin I: 0.25 ng/mL — ABNORMAL HIGH (ref 0.00–0.06)

## 2011-02-17 LAB — DIFFERENTIAL
Basophils Absolute: 0 10*3/uL (ref 0.0–0.1)
Basophils Relative: 1 % (ref 0–1)
Eosinophils Absolute: 0.1 10*3/uL (ref 0.0–0.7)
Eosinophils Absolute: 0.2 10*3/uL (ref 0.0–0.7)
Eosinophils Relative: 2 % (ref 0–5)
Eosinophils Relative: 5 % (ref 0–5)
Lymphocytes Relative: 16 % (ref 12–46)
Monocytes Absolute: 0.4 10*3/uL (ref 0.1–1.0)
Monocytes Absolute: 0.5 10*3/uL (ref 0.1–1.0)
Monocytes Relative: 11 % (ref 3–12)

## 2011-02-24 ENCOUNTER — Encounter: Payer: Self-pay | Admitting: Gastroenterology

## 2011-03-07 ENCOUNTER — Emergency Department (HOSPITAL_COMMUNITY): Payer: Medicare HMO

## 2011-03-07 ENCOUNTER — Emergency Department (HOSPITAL_COMMUNITY)
Admission: EM | Admit: 2011-03-07 | Discharge: 2011-03-07 | Disposition: A | Discharge status: Home / Self Care | Payer: Medicare HMO | Attending: Emergency Medicine | Admitting: Emergency Medicine

## 2011-03-07 DIAGNOSIS — Z79899 Other long term (current) drug therapy: Secondary | ICD-10-CM | POA: Insufficient documentation

## 2011-03-07 DIAGNOSIS — F411 Generalized anxiety disorder: Secondary | ICD-10-CM | POA: Insufficient documentation

## 2011-03-07 DIAGNOSIS — I10 Essential (primary) hypertension: Secondary | ICD-10-CM | POA: Insufficient documentation

## 2011-03-07 DIAGNOSIS — E785 Hyperlipidemia, unspecified: Secondary | ICD-10-CM | POA: Insufficient documentation

## 2011-03-07 DIAGNOSIS — E119 Type 2 diabetes mellitus without complications: Secondary | ICD-10-CM | POA: Insufficient documentation

## 2011-03-07 DIAGNOSIS — R42 Dizziness and giddiness: Secondary | ICD-10-CM | POA: Insufficient documentation

## 2011-03-07 LAB — DIFFERENTIAL
Basophils Absolute: 0 10*3/uL (ref 0.0–0.1)
Basophils Relative: 0 % (ref 0–1)
Eosinophils Absolute: 0.2 10*3/uL (ref 0.0–0.7)
Eosinophils Relative: 5 % (ref 0–5)
Monocytes Absolute: 0.4 10*3/uL (ref 0.1–1.0)

## 2011-03-07 LAB — CBC
HCT: 32.4 % — ABNORMAL LOW (ref 39.0–52.0)
MCHC: 32.7 g/dL (ref 30.0–36.0)
MCV: 81.2 fL (ref 78.0–100.0)
RDW: 17.5 % — ABNORMAL HIGH (ref 11.5–15.5)

## 2011-03-07 LAB — COMPREHENSIVE METABOLIC PANEL
Alkaline Phosphatase: 74 U/L (ref 39–117)
BUN: 32 mg/dL — ABNORMAL HIGH (ref 6–23)
Calcium: 9.6 mg/dL (ref 8.4–10.5)
GFR calc non Af Amer: 57 mL/min — ABNORMAL LOW (ref >60)
Glucose, Bld: 89 mg/dL (ref 70–99)
Total Protein: 6.7 g/dL (ref 6.0–8.3)

## 2011-03-07 LAB — GLUCOSE, CAPILLARY
Glucose-Capillary: 52 mg/dL — ABNORMAL LOW (ref 70–99)
Glucose-Capillary: 125 mg/dL — ABNORMAL HIGH (ref 70–99)

## 2011-03-23 NOTE — Discharge Summary (Signed)
NAME:  Oscar Hickman, RAPER NO.:  [id number]   MEDICAL RECORD NO.:  [id number]          PATIENT TYPE:  INP   LOCATION:  A320                          FACILITY:  [facility]   PHYSICIAN:  Melvyn Novas, MDDATE OF BIRTH:  [birth year]   DATE OF ADMISSION:  02/04/2008  DATE OF DISCHARGE:  LH                               DISCHARGE SUMMARY   HISTORY OF PRESENT ILLNESS:  The patient is a 70 year old black male  with known prostatic hypertrophy who is followed by Urology one time  visit.  He refused followup due to anxiety disorder, which was  addressed.  The patient presented to the hospital with suprapubic  tenderness over 2 days' time, was found to have bladder outlet  obstruction, Foley inserted, he had postobstructive diuresis, and he had  bilateral hydronephrosis.  Electrolytes were essentially within normal  limits.  Creatinine was 0.92 at the time of discharge.  We also noted a  minimally elevated lipase and amylase with no epigastric discomfort.  This could be mild pancreatitis, however, was subclinical.  Calcium and  CBC were within normal limits.  The patient has blood pressure  controlled and anxiety controlled and seen in consultation by Urology,  who felt it was okay to discharge him with a Foley, and follow up in the  office for possible surgical intervention at a later date.  He was  hemodynamically stable with blood pressure of 135/84 on admission and  electrolytes were fine.   DISCHARGE MEDICATIONS:  1. Paxil 20 mg per day.  2. Flomax 0.4 mg per day.  3. Vicodin 500 p.o. q. 6 h. p.r.n.  4. Norvasc 10 mg per day.  5. Accupril 40 mg per day.  6. Metformin 500 mg p.o. b.i.d.  7. Crestor 20 mg daily.   He will follow up in the office in 4 days' time.      Melvyn Novas, MD  Electronically Signed     RMD/MEDQ  D:  02/07/2008  T:  02/08/2008  Job:  [id number]

## 2011-03-23 NOTE — H&P (Signed)
NAME:  Oscar Hickman, Oscar Hickman             ACCOUNT NO.:  [id number]   MEDICAL RECORD NO.:  [id number]          PATIENT TYPE:  INP   LOCATION:  A304                          FACILITY:  [facility]   PHYSICIAN:  Edward L. Juanetta Gosling, M.D.DATE OF BIRTH:  Jun 27, 1941   DATE OF ADMISSION:  02/08/2009  DATE OF DISCHARGE:  LH                              HISTORY & PHYSICAL   REASON FOR ADMISSION:  Chest discomfort with multiple cardiac risk  factors.   HISTORY:  Oscar Hickman is a patient of Dr. Janna Arch who came to the  emergency room after having had chest pain.  His chest pain was mostly  on the right side of his chest and fairly atypical for cardiac disease,  but he does have a history of hypertension, diabetes, hyperlipidemia,  and prostate cancer.  Because of his multiple risk factors in the fact  that he does have chest discomfort, he is being brought in on a rule-out  MI protocol.  His pain is not pleuritic in nature.  He says it feels  like a cough and feels something like he can feel his heart beating.  It  did make him somewhat short of breath.  He did not have any nausea.  He  did not have any vomiting.  It is unclear if he had any diaphoresis.  His EKG is not normal, but it appears to be about the same as his  previous changes in EKG.   PAST MEDICAL HISTORY:  Positive for hyperlipidemia, diabetes mellitus  type 2, hypertension, prostate cancer.  He has had prostate surgery and  radiation.   FAMILY HISTORY:  Positive for some cardiac disease, but the extent of  that is not very clear.   CURRENT MEDICATIONS:  1. Amlodipine 10 mg daily.  2. Accupril 40 mg daily.  3. Metformin 500 mg b.i.d.  4. Crestor 20 mg daily.  5. Flomax 0.4 daily.  6. Ibuprofen 800 mg as needed.  7. Cozaar 100 mg daily.   ALLERGIES:  He has no known drug allergies.   REVIEW OF SYSTEMS:  Except as mentioned is essentially negative.  He  does complain of some difficulty with his urine and has a chronic Foley  catheter.   PHYSICAL EXAMINATION:  GENERAL:  His exam today shows that he is awake  and alert.  VITAL SIGNS:  His temperature is 98, pulse 84, respirations 18, blood  pressure 116/118, O2 sats 97%.  HEENT:  His pupils are reactive.  His nose and throat are clear.  NECK:  Supple without masses.  He does not have any jugular venous  distention.  CHEST:  Pretty clear without any wheezes now.  HEART:  His heart is regular.  I do not hear a gallop.  He does not have  any definite chest wall pain.  ABDOMEN:  Soft.  No masses are felt.  Bowel sounds are active.  EXTREMITIES:  Showed no edema.  CENTRAL NERVOUS SYSTEM:  Shows he appears to be anxious, but otherwise  okay.   LABORATORY WORK:  Cardiac markers point of care so far are negative.  Urine drug screen is  negative.  BMET shows his potassium is 3.4, BUN of  11, creatinine 0.74, glucose of 338, and CBC shows white count 5300,  hemoglobin 13.8, platelets 217.   ASSESSMENT:  I think this is likely not cardiac in nature; however, he  does have multiple risk factors and is going to need to be evaluated.  He is going to be admitted for serial EKGs and cardiac enzymes and he  may end up needing to have a cardiology consultation depending on his  clinical course.  He is going to be on sliding scale insulin, low-dose  Lovenox.  I am not going to fully anticoagulate him at this point.  He  is going to have nitroglycerin available.  He will be on O2 on a p.r.n.  basis.  Continue with his other medications except for his diabetic  medications at this point.       Edward L. Juanetta Gosling, M.D.  Electronically Signed     ELH/MEDQ  D:  02/08/2009  T:  02/08/2009  Job:  [id number]

## 2011-03-23 NOTE — Consult Note (Signed)
NAME:  Oscar Hickman, Oscar Hickman             ACCOUNT NO.:  [id number]   MEDICAL RECORD NO.:  [id number]          PATIENT TYPE:  INP   LOCATION:  A320                          FACILITY:  [facility]   PHYSICIAN:  Ky Barban, M.D.DATE OF BIRTH:  Jul 28, 1941   DATE OF CONSULTATION:  02/05/2008  DATE OF DISCHARGE:                                 CONSULTATION   HISTORY:  This is a 70 year old patient who is a very poor historian was  involved with team because he has bilateral hydronephrosis.  He came to  the emergency room with complaints of generalized abdominal pain.  CT  scan showed that he has bilateral hydronephrosis and distended bladder.  I was called in to see.  I inserted a Foley catheter and 2000 mL of  urine was removed and he feels better.  The patient told me apparently  he has been into our office with similar problem a few years ago, but  never came back for followup.  It is hard for me to tell how much  prostatism he has.   PAST MEDICAL HISTORY:  1. Hyperlipidemia.  2. Type 2 diabetes.  3. Hypertension.  4. Benign prostatic hypertrophy and __________ with  PSA.   PHYSICAL EXAMINATION:  GENERAL:  Moderately built, not in acute distress  since I put in Foley catheter.  VITAL SIGNS:  His blood pressure is 164/95.  Temperature is normal.  ABDOMEN:  Soft and flat.  Liver, spleen, and kidneys nonpalpable.  Distended bladder is palpable.  EXTERNAL GENITALIA:  Uncircumcised.  Meatus adequate.  Testes are  normal.  RECTAL:  Prostate 1.5 plus, feels slightly hard.   IMPRESSION:  Bilateral hydroureteronephrosis.   LAB WORKUP:  Sodium 141, potassium 3.3, chloride 103, and CO2 is 30,  glucose 184, BUN is 11, and creatinine 0.95.  WBC count is 9.4.  Hematocrit is 37.9.  His PSA came back at 77.93.   IMPRESSION:  Bladder neck obstruction, probably benign prostatic  hypertrophy versus prostate cancer.  I recommend Foley catheter  drainage, watch for  obstructive diuresis, and then we  will get a  followup ultrasound to see if the  hydronephrosis is going down .      Ky Barban, M.D.  Electronically Signed     MIJ/MEDQ  D:  02/05/2008  T:  02/06/2008  Job:  [id number]

## 2011-03-23 NOTE — Op Note (Signed)
NAME:  Oscar Hickman, Oscar Hickman             ACCOUNT NO.:  [id number]   MEDICAL RECORD NO.:  [id number]          PATIENT TYPE:  INP   LOCATION:  A309                          FACILITY:  [facility]   PHYSICIAN:  Ky Barban, M.D.DATE OF BIRTH:  1941-01-12   DATE OF PROCEDURE:  DATE OF DISCHARGE:                               OPERATIVE REPORT   PREOPERATIVE DIAGNOSES:  Acute urinary retention, benign prostatic  hyperplasia.   POSTOPERATIVE DIAGNOSES:  Acute urinary retention, benign prostatic  hyperplasia.   PROCEDURE:  Transurethral resection of prostate.   ANESTHESIA:  The patient given spinal anesthesia.   DESCRIPTION OF PROCEDURE:  The patient placed in lithotomy position, and  after usual prep and drape, a #28 Iglesias resectoscope was introduced  into the bladder, posterior bladder neck up to the level of the  verumontanum was resected.  Then, the bladder neck was circumferentially  dissected down to the circular fibers and bleeders were coagulated.  Resectoscope was pulled back at the level of the verumontanum and  rotated to 11 o'clock position.  A groove was created between the  bladder neck into the level of the verumontanum at 11 o'clock position  down to the capsule.  Now, the right lobe was resected between 11 and 7  o'clock position.  Similarly, the left lobe was resected between 1 and 5  o'clock position.  Most of the lateral adenomas were out.  There was  apical tissue and the anterior midline tissue resected at the end very  carefully not to injure the sphincter or the verumontanum.  Prostatic  urethra was opened.  Chips were evacuated.  Bleeders were coagulated.  Resectoscope was removed.  A #24 three-way Foley catheter left in for  drainage.  The patient left the operating room in satisfactory  condition.  Her CBI is clear.      Ky Barban, M.D.  Electronically Signed     MIJ/MEDQ  D:  04/02/2008  T:  04/03/2008  Job:  [id number]

## 2011-03-23 NOTE — Assessment & Plan Note (Signed)
[facility]                       [name] CARDIOLOGY OFFICE NOTE   Oscar, Hickman                    MRN:          [id number]  DATE:02/26/2009                            DOB:          09-Oct-1941    CARDIOLOGIST:  Gerrit Friends. Dietrich Pates, MD, [facility]   PRIMARY CARE PHYSICIAN:  [name], MD   REASON FOR VISIT:  Post-hospitalization followup.   HISTORY OF PRESENT ILLNESS:  [name] is a 70 year old male patient  with a history of diabetes mellitus, hypertension, hyperlipidemia, whom  we recently saw in consultation at [facility] when he presented  with atypical chest pain.  He had abnormal cardiac markers with a  troponin of 0.33-0.18.  His overall picture was concerning for acute  coronary syndrome.  Prior to transfer, he had an echocardiogram that  demonstrated reduced LV function with an EF of 25% with moderate-to-  severe diffuse LV hypokinesis and severe hypokinesis of the posterior  wall.  He did have mildly to moderately increased LV wall thickness.  He  was subsequently transferred to [facility] for further  evaluation.  He underwent cardiac catheterization that demonstrated an  EF of 35% and just mild nonobstructive diffuse CAD.  Specifically, he  had a 20% stenosis in the mid left main.  He had some diffuse  atherosclerosis with nonobstructive plaque in the RCA throughout the  proximal, mid and distal portions.  He was discharged from the hospital  on several medicines including Cozaar and Toprol as well as Flomax and  ibuprofen.  None of these medicines show up on his list today.  He was  asked several times if he was taking any medicines than what was there  in his bag.  He notes that the medicines listed below are the only  medicines he is taking at this time.  In the office, he denies any chest  pain.  He denies orthopnea, PND, or pedal edema.  He describes NYHA  class I-II symptoms.  He walks several  times a week without significant  shortness of breath.  He also does about 50 push-ups a day without any  symptoms.  He denies syncope.   CURRENT MEDICATIONS:  1. Aspirin 81 mg daily.  2. Amlodipine 10 mg daily.  3. Crestor 10 mg daily.  4. Quinapril 40 mg daily.  5. Metformin 500 mg b.i.d.   ALLERGIES:  No known drug allergies.   PHYSICAL EXAMINATION:  GENERAL:  He is well-nourished, well-developed  male in no acute distress.  VITAL SIGNS:  Blood pressure is 160/110, pulse 66, weight 186 pounds.  HEENT:  Normal.  NECK:  Without JVD.  CARDIAC:  Normal S1 and S2.  Regular rate and rhythm.  No S3.  LUNGS:  Clear to auscultation bilaterally.  No rales.  ABDOMEN:  Soft, nontender with normoactive bowel sounds.  No  organomegaly.  EXTREMITIES:  Without edema.  NEUROLOGIC:  He is alert and oriented x3.  Cranial nerves II through XII  grossly intact.  SKIN:  Warm and dry.   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   ASSESSMENT AND PLAN:  1. Nonischemic cardiomyopathy.  As outlined above, he has an EF of      about 25% by echocardiogram.  He had minimal nonobstructive plaque      in his cardiac catheterization.  We will try to continue to titrate      his medicines for his cardiomyopathy at this time.  I am not      certain what happened to the medicines that he was discharged on.      He has limited funds.  He will continue the above-listed      medications.  He is already on an ACE inhibitor.  I will initiate      carvedilol at 3.25 mg twice a day initially and then increase this      to 6.25 mg twice a day.  He will be brought back for blood pressure      check and a rhythm strip with a nurse early next week.  If his      heart rate and blood pressure will tolerate, we will increase his      carvedilol  further.  Otherwise, I anticipate he will need another      agent added.  Given that he is an [place] with a      nonischemic cardiomyopathy, he would likely benefit from having      hydralazine and nitrates added to his medical regimen.  We can      decide on the dosage of these when he returns for his followup.  He      is optivolemic on exam today.  It does not appear that he needs      diuretic at this time.  2. Minimal coronary plaquing by cardiac catheterization.  He can      continue on aspirin.  3. Dyslipidemia.  He can continue on his Crestor and follow up with      Dr. Janna Arch for further management.  4. Hypertension.  As outlined above, we are adjusting his medications.  5. Diabetes mellitus.  He should continue follow up with his primary      care physician.   DISPOSITION:  He will follow up with a nurse next week as outlined.  He  will follow up with me on a day Dr. Dietrich Pates is here, in about 2 weeks  or sooner p.r.n.  He has been instructed on weighing himself and calling  us if he develops increased weight, swelling, or shortness of breath.     Tereso Newcomer, PA-C  Electronically Signed      Jesse Sans. Daleen Squibb, MD, [facility]  Electronically Signed   SW/MedQ  DD: 02/26/2009  DT: 02/27/2009  Job #: [id number]   cc:   Melvyn Novas, MD

## 2011-03-23 NOTE — Discharge Summary (Signed)
NAME:  Oscar Hickman, Oscar Hickman NO.:  [id number]   MEDICAL RECORD NO.:  [id number]          PATIENT TYPE:  INP   LOCATION:  2004                         FACILITY:  [facility]   PHYSICIAN:  Madolyn Frieze. Jens Som, MD, FACCDATE OF BIRTH:  May 24, 1941   DATE OF ADMISSION:  02/10/2009  DATE OF DISCHARGE:  02/11/2009                               DISCHARGE SUMMARY   PRIMARY CARDIOLOGIST:  Gerrit Friends. Dietrich Pates, MD, [facility]   PRIMARY CARE Chassidy Layson:  Melvyn Novas, MD   DISCHARGE DIAGNOSIS:  Chest pain.   SECONDARY DIAGNOSES:  1. Nonobstructive coronary artery disease by cardiac catheterization      this admission.  2. Chronic systolic congestive heart failure/nonischemic      cardiomyopathy.  3. Hypertension.  4. Hyperlipidemia.  5. Type 2 diabetes mellitus.  6. Anxiety.  7. Osteoarthritis.  8. History of benign prostatic hypertrophy, status post transurethral      resection of prostate in May 2009.  9. Prostate cancer, status post resection and radiation therapy.   ALLERGIES:  No known drug allergies.   PROCEDURE:  Left heart cardiac catheterization.   HISTORY OF PRESENT ILLNESS:  A 70 year old [place] American male with  the above problem list.  He was in his usual state of health until February 07, 2009, when he had episode of dizziness and near fall.  The following  day, he had some right-sided chest discomfort without associated  symptoms.  Due to these symptoms, the patient presented to the [facility] ED on February 08, 2009.  He was subsequently admitted and Cardiology  was consulted after the patient ruled out for MI.  So, with the given  risk factors, the patient may benefit from cardiac catheterization and  he was transferred to [name] for further evaluation.   HOSPITAL COURSE:  Cardiac catheterization was performed on February 10, 2009, revealing nonobstructive coronary artery disease with an EF of  35%.  Of note, the patient did have a 2-D echocardiogram that was  done  at [facility] on February 10, 2009, and this showed an EF of 25%  with moderate-to-severe diffuse LV hypokinesis and severe hypokinesis to  akinesis to posterior wall.   [name] has been ambulating postcatheterization without recurrent  symptoms or limitations.  He will be discharged home today in good  condition.   DISCHARGE LABORATORY DATA:  Hemoglobin 13.6, hematocrit 40.5, WBC 4.3,  and platelets 220.  Sodium 140, potassium 3.7, chloride 108, CO2 26, BUN  8, creatinine 0.83, glucose 201, total bilirubin 0.8, alkaline  phosphatase 99, AST 19, ALT 10, total protein 6.5, albumin 3.2, calcium  9.6, CK 73, MB 2.4, troponin-I 0.21, total cholesterol 219,  triglycerides 94, HDL 44, LDL 156, and TSH 0.842.  Urine drug screen was  negative.   DISPOSITION:  The patient will be discharged home today in good  condition.   FOLLOWUP PLANS AND APPOINTMENTS:  We have arranged followup with Dr.  Dietrich Pates in a [facility] on Wednesday February 26, 2009, at 1:20  p.m.  We have asked to follow up with Dr. Janna Arch as scheduled.  The  patient will likely require repeat 2-D echocardiogram in approximately 3  months while on maximum medical therapy to determine whether or not his  EF has improved, and if not, he may be considered for ICD placement.   DISCHARGE MEDICATIONS:  1. Aspirin 81 mg daily.  2. Norvasc 10 mg daily.  3. Accupril 40 mg daily.  4. Cozaar 100 mg daily.  5. Toprol-XL 50 mg daily.  6. Crestor 20 mg daily.  7. Metformin 500 mg daily to resume on February 13, 2009.  8. Flomax 0.4 mg daily.  9. Ibuprofen 800 mg t.i.d. p.r.n.  10.Nitroglycerin 0.4 mg sublingual p.r.n. for chest pain.   OUTSTANDING LABORATORY STUDIES:  None.   DURATION OF DISCHARGE/ENCOUNTER:  Forty five minutes including physician  time.      Nicolasa Ducking, ANP      Madolyn Frieze. Jens Som, MD, [facility]  Electronically Signed    CB/MEDQ  D:  02/11/2009  T:  02/12/2009  Job:  [id number]   cc:    Melvyn Novas, MD

## 2011-03-23 NOTE — H&P (Signed)
NAME:  Oscar Hickman, CRANEY NO.:  [id number]   MEDICAL RECORD NO.:  [id number]          PATIENT TYPE:  INP   LOCATION:  A320                          FACILITY:  [facility]   PHYSICIAN:  Melvyn Novas, MDDATE OF BIRTH:  01-22-1941   DATE OF ADMISSION:  02/04/2008  DATE OF DISCHARGE:  LH                              HISTORY & PHYSICAL   The patient is a 70 year old black male with an anxiety disorder, who  was seen in the ER for symptoms referable to the cystitis yesterday.  Urine was apparently clean.  He was empirically given Cipro and treated.  He comes back today with symptoms referable to lower abdominal  discomfort.  He has frequency of urination.  No full stream.  He was  evaluated.  He also had an elevated amylase which may be an incidental  finding.  He has no significant referable upper GI or epigastric  discomfort.  No nausea, vomiting, melena, hematemesis, hematochezia.  I  believe his symptoms are referable to bladder outlet obstruction from  prostate enlargement.  He has an anxiety disorder and went to a  urologist one time a year ago and then refused followup.  We will  evaluate this in the hospital and add Flomax 0.4 daily now and obtain a  PSA as well as continue empiric Cipro 500 b.i.d.   PAST MEDICAL HISTORY:  1. Hyperlipidemia.  2. Type 2 diabetes.  3. Hypertension.  4. BPH or possible elevated PSA.   SURGICAL HISTORY:  Unremarkable.   He has no known allergies.   CURRENT MEDICINES:  1. Norvasc 10 mg per day.  2. Accupril 40 mg per day.  3. Metformin 500 b.i.d.  4. Crestor 20 mg per day.  5. Aspirin 81 mg per day.  6. Cipro 500 b.i.d. for the last 24 hours.  7. Flomax 0.4 which he is noncompliant with.   He does not smoke.  He lives with his wife.   PHYSICAL EXAMINATION:  VITAL SIGNS:  Temperature is 98.5, blood pressure  16 4/94, heart rate 93, respiratory rate is 20.  EYES:  PERRLA.  Extraocular movements intact.  Sclerae clear.  Conjunctivae pink.  NECK:  Shows no JVD, no carotid bruits, no thyromegaly, no thyroid  bruits.  LUNGS:  Clear to A&P.  No rales, wheezes, or rhonchi appreciable.  HEART:  Regular rhythm.  No murmurs, gallops, heaves, thrills, or rubs.  ABDOMEN:  Soft.  No epigastric tenderness.  No guarding, rebound,  hepatosplenomegaly.  Suprapubic fullness, some tenderness to palpation.  RECTAL:  Heme negative in the ER.  EXTREMITIES:  No clubbing, cyanosis, or edema.  NEUROLOGIC:  Cranial nerves II-XII are grossly intact.  The patient  moves all 4 extremities.  Plantar's are downgoing.   IMPRESSION:  1. Symptoms referable to bladder outlet obstruction.  2. Incidental elevated lipase, possible pancreatitis.  3. Hypertension.  4. Hyperlipidemia.  5. Diabetes.  6. Anxiety disorder, preventing urologic followup.   PLAN:  Admit, empiric Flomax 0.4, obtain PSA, continue Cipro 500 b.i.d.,  and we will get urology consult now that the patient is in a controlled  environment, serial amylase and lipase, and clear liquid diet as well as  serial monitoring of glucose.      Melvyn Novas, MD  Electronically Signed     RMD/MEDQ  D:  02/04/2008  T:  02/04/2008  Job:  [id number]

## 2011-03-23 NOTE — Group Therapy Note (Signed)
NAME:  Oscar Hickman, Oscar Hickman             ACCOUNT NO.:  [id number]   MEDICAL RECORD NO.:  [id number]          PATIENT TYPE:  INP   LOCATION:  A304                          FACILITY:  [facility]   PHYSICIAN:  Edward L. Juanetta Gosling, M.D.DATE OF BIRTH:  1941/04/06   DATE OF PROCEDURE:  DATE OF DISCHARGE:                                 PROGRESS NOTE   Patient of Dr. Otilio Saber.   Mr. Kussman was admitted yesterday with atypical chest pain.  He has  not had any chest pain in the last 16 hours or so.  He did have some  elevation of his troponin and although, he has normal total CK, he has  slightly elevated CK-MB as well.  I have discussed the situation with  the cardiologist on call for [facility], because that is where  his wife has been getting her cardiac care, and they have suggested that  we go ahead and heparinize him now that he has had some change in his  enzymes, get the echocardiogram as scheduled, and get a Cardiology  consultation.  We discussed the possibility of moving into [place]  and it is felt that with minimal if any EKG changes and minimal if any  changes in his enzymes, though we do not need to do that, but did  suggest to be heparinized and we will get another set of enzymes and an  EKG in the morning.      Edward L. Juanetta Gosling, M.D.  Electronically Signed     ELH/MEDQ  D:  02/09/2009  T:  02/09/2009  Job:  [id number]

## 2011-03-23 NOTE — H&P (Signed)
NAME:  Oscar Hickman, Oscar Hickman             ACCOUNT NO.:  [id number]   MEDICAL RECORD NO.:  [id number]          PATIENT TYPE:  AMB   LOCATION:  DAY                           FACILITY:  [facility]   PHYSICIAN:  Ky Barban, M.D.DATE OF BIRTH:  May 03, 1941   DATE OF ADMISSION:  DATE OF DISCHARGE:  LH                              HISTORY & PHYSICAL   This patient is coming on Tuesday, Apr 02, 2008, as an outpatient to  undergo TUR of  prostate.   CHIEF COMPLAINT:  Urinary retention.   HISTORY:  A 70 year old gentleman who has a longstanding history of  prostatism with an elevated PSA.  In 1999, he underwent prostate biopsy  because his PSA was 7.6.  Biopsy was negative for a prostate cancer.  We  have not seen him in the office since then.  Recently, he was seen in  the hospital with urinary retention and bilateral hydronephrosis, and we  inserted a Foley catheter.  Hydronephrosis has subsided.  Cystoscopy  shows he has enlarged prostate with bladder neck obstruction.  I have  taken the Foley catheter out once again, gave him voiding trial, he went  into retention.  Foley catheter has been reinserted and I told him we  need to do a TUR of prostate for which he is coming on Tuesday as an  outpatient.  I have discussed with the patient and his wife procedure,  limitation, and complications.  They understand and wants me to proceed.   PERSONAL HISTORY:  He does not smoke or drink.   REVIEW OF SYSTEMS:  Unremarkable.   PHYSICAL EXAMINATION:  GENERAL:  Markedly well, not in acute distress,  fully conscious, alert, and oriented.  VITAL SIGNS:  Blood pressure 160/96, temperature 96.45.  CENTRAL NERVOUS SYSTEM:  Negative.  HEAD, NECK, EYES, AND ENT: Negative.  CHEST:  Symmetrical.  HEART:  Regular sinus rhythm.  ABDOMEN:  Soft, flat.  Liver, spleen, and kidneys are not palpable.  No  CVA tenderness.  EXTERNAL GENITALIA:  Uncircumcised.  Testicles are normal.  Foley  catheter is in place.  RECTAL:  Normal sphincter, no rectal mass, prostate 1-1/2+, smooth, and  firm.   IMPRESSION:  1. Benign prostatic hypertrophy.  2. Recurrent urinary retention.   PLAN:  TUR of prostate and then admit him in the hospital.      Ky Barban, M.D.  Electronically Signed     MIJ/MEDQ  D:  03/28/2008  T:  03/29/2008  Job:  [id number]

## 2011-03-23 NOTE — Discharge Summary (Signed)
NAME:  Oscar Hickman, Oscar Hickman             ACCOUNT NO.:  [id number]   MEDICAL RECORD NO.:  [id number]         PATIENT TYPE:  PINP   LOCATION:  A309                          FACILITY:  [facility]   PHYSICIAN:  Ky Barban, M.D.DATE OF BIRTH:  1941-05-30   DATE OF ADMISSION:  DATE OF DISCHARGE:  LH                               DISCHARGE SUMMARY   A 70 year old gentleman has a longstanding history of prostatism with  elevated PSA.  In 1999, he underwent prostate biopsy because his PSA was  7.6.  Biopsy was negative for prostate cancer.  He never came back to  the office since then.  Recently was seen in the hospital with urinary  retention, bilateral hydronephrosis, he was sent home with Foley  catheter.  Subsequent ultrasound does show the hydronephrosis had  subsided.  He has bladder neck obstruction, so I have advised him to  undergo TUR prostate for which he is now being brought as an outpatient  having preadmission workup done.  CBC, urinalysis, Astra-7 is normal.  He was taken to the operating room, underwent TUR prostate.   POSTOP COURSE:  First postop day was fully conscious, alert, oriented,  and not in acute distress.  Abdomen soft, flat.  Liver, spleen, kidneys  not palpable.  Hematocrit is 36.  Sodium 136, potassium 3.9, chloride  103, CO2 is 29, glucose 173, BUN is 9, and creatinine 0.7.  Urine  culture so far shows no growth, so CVA is discontinued, put him out of  bed, and also started him on his diet.  Second postop day, his urine is  clear and he is out of bed, so discontinued his Foley catheter.  He is  voiding fine with satisfactory stream.  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   FINAL DISCHARGE DIAGNOSES:  1. Prostate cancer  2.  Diabetes, non-insulin-dependent.  3. Depression.   He is advised to continue taking his Crestor, metformin, amlodipine,  enalapril, Paxil, and I will see him back in the office in 2 weeks.      Ky Barban, M.D.  Electronically Signed     MIJ/MEDQ  D:  07/05/2008  T:  07/06/2008  Job:  [id number]

## 2011-03-23 NOTE — Consult Note (Signed)
NAME:  Oscar Hickman, Oscar Hickman             ACCOUNT NO.:  [id number]   MEDICAL RECORD NO.:  [id number]          PATIENT TYPE:  INP   LOCATION:  A304                          FACILITY:  [facility]   PHYSICIAN:  Gerrit Friends. Dietrich Pates, MD, FACCDATE OF BIRTH:  May 21, 1941   DATE OF CONSULTATION:  02/10/2009  DATE OF DISCHARGE:                                 CONSULTATION   PRIMARY CARE PHYSICIAN:  Melvyn Novas, MD.   CARDIOLOGIST:  Gerrit Friends. Dietrich Pates, MD, [facility].   REASON FOR CONSULTATION:  Chest pain.   PRESENT ILLNESS:  Oscar Hickman is a 70 year old male with a history of  diabetes mellitus, hypertension, hyperlipidemia who presented to the  emergency room at [facility] on February 08, 2009, with complaints  of chest pain.  He is a difficult historian and is somewhat vague about  his symptoms.  He notes that he is quite active and walks almost daily  without significant symptoms.  He notes that while walking on February 07, 2009, he became dizzy and almost fell.  He had no other symptoms  reported that day.  On February 08, 2009,  he notes developed of right-sided  pain that began around his right lower quadrant and radiated up into his  right chest and lasted just a few seconds.  This was apparently at rest.  He also notes a symptom of bubbles in his right chest that lasted a  few minutes.  His symptoms concerned him enough that he decided to come  to the emergency room.  He denies any exertional symptoms.  He is not  sure if he was short of breath or not.  He does describe some nausea.  He denies diaphoresis, arm or jaw pain or syncope.  He is currently pain-  free.  His troponins have been noted to be elevated and we are asked to  further evaluate.   PAST MEDICAL HISTORY:  1. Adenosine Cardiolite June 2003 demonstrated an EF of 50%.  No      ischemia.  No scar.  2. Echocardiogram June 2003:  Moderate left atrial enlargement, mild      RVH, mild LVH, mild inferior hypokinesis, normal LV  function.  3. Diabetes mellitus.  4. Hypertension.  5. Hyperlipidemia.  6. Anxiety.  7. Osteoarthritis.  8. BPH status post TURP May 2009.  9. Prostate cancer status post resection and radiation therapy.   MEDICATIONS AT HOME:  1. Amlodipine 10 mg daily.  2. Accupril 40 mg daily.  3. Metformin 500 mg b.i.d.  4. Crestor 20 mg daily.  5. Flomax 0.4 mg daily.  6. Ibuprofen 800 mg p.r.n.  7. Cozaar 100 mg daily.   ALLERGIES:  No known drug allergies.   SOCIAL HISTORY:  The patient lives in [place] with his wife.  He  denies tobacco abuse.  He does admit to being a former drinker but quit  years ago.  He is retired from [employer].   FAMILY HISTORY:  He notes that both his parents died from congestive  heart failure.  He does have a brother who has had a myocardial  infarction as well as stroke that affected his left side.  He also has a  son who had a myocardial infarction in his 53s.   REVIEW OF SYSTEMS:  Please see HPI.  He denies fevers, headache,  dysuria, hematuria, bright blood per rectum or melena.  He denies any  vomiting or diarrhea.  Denies orthopnea, PND or pedal edema.  He does  have a cough with questionable production but he denies hemoptysis.  He  describes some symptoms that are somewhat concerning for palpitations,  although he denies this.  All other systems reviewed and negative.   PHYSICAL EXAMINATION:  He is a well-nourished, well-developed male in no  acute distress.  Blood pressure is 132/88, pulse in the 70s, respiration  20, 98.4, oxygen saturation 100% on 2 liters.  Weight is 84.5 kg.  HEENT:  Normal.  NECK:  Without JVD.  LYMPHATICS:  Without lymphadenopathy.  ENDOCRINE:  Without thyromegaly.  CARDIAC:  Normal S1, S2, regular rate and rhythm with a modest systolic  ejection murmur.  LUNGS: Clear to auscultation bilaterally.  No rales.  No wheezing.  SKIN:  Warm and dry without rash.  ABDOMEN:  Soft, nontender with normoactive bowel sounds.   No  organomegaly.  EXTREMITIES:  With trace edema bilaterally.  MUSCULOSKELETAL:  Without joint deformity.  NEUROLOGIC:  He is alert and oriented x3.  Cranial nerves II-XII are  grossly intact.   Chest x-ray:  No acute findings.  EKG:  Sinus rhythm with heart 68 with  T-wave inversions in II, III and aVF and V6, left anterior fascicular  block, left axis deviation.   LABS:  White count 3600, hemoglobin 13.6, MCV 90.5, platelet count  222,000.  Potassium 3.4, creatinine 0.74, glucose 338.  CK 131, 108, 88,  and 73; CK-MB 5.3, 4, 3.1, and 2.4; troponin-I 0.33, 0.25, 0.18, and  0.21.  INR 1.0.  Urine drug screen negative.   ASSESSMENT:  1. Elevated cardiac enzymes and 70 year old male with multiple cardiac      risk factors and reported history of chest pain.  His symptoms of      chest pain are atypical and he is a poor historian.  However, his      overall picture is concerning for acute coronary syndrome.  2. Diabetes mellitus.  3. Hyperlipidemia.  4. Hypertension.  5. Prostate cancer status post resection plus radiation therapy.  6. Family history of coronary artery disease.   RECOMMENDATIONS:  The patient was also interviewed and examined by Dr.  Dietrich Pates.  Prior records have been obtained and reviewed.  Prior EKGs  have also been reviewed and reread.  He has atypical right-sided chest  pain with elevation of troponin, decrease in CPK in normal range and  borderline CK-MB values.  Inferolateral T-wave inversions are new since  2009.  His overall picture is most likely consistent with acute coronary  syndrome.  Pulmonary embolism needs be ruled out.  A D-dimer will be  checked at this time.  TSH will also be added to the blood located in  laboratory.  If his D-dimer is negative, cardiac catheterization is  advised.  This has been discussed with the patient.  He is currently  unsure whether or not he would like to proceed with procedure to his  extensive medical care required  for prostate cancer.  Currently he is  being kept n.p.o. will be placed on IV fluids.  The cath schedule at  [facility] will permit the procedure today if  the patient  decides to do so.  His medical regimen will be continued.  Aspirin will  be added as well as a beta blocker.  His nitro paste will be continued  for now as well as IV heparin.  Of note, an echocardiogram is currently  pending.  Thank you very much for the consultation.  We will glad to  follow the patient throughout the remainder of his admission.      Tereso Newcomer, PA-C      Gerrit Friends. Dietrich Pates, MD, [facility]  Electronically Signed    SW/MEDQ  D:  02/10/2009  T:  02/10/2009  Job:  [id number]   cc:   Melvyn Novas, MD  Fax: [phone]

## 2011-03-26 NOTE — Procedures (Signed)
[facility]  Patient:    Oscar Hickman, Oscar Hickman Visit Number: [id number] MRN: [id number]          Service Type: MED Location: 2A A220 01 Attending Physician:  Syliva Overman Dictated by:   Kari Baars, M.D. Proc. Date: 05/15/02 Admit Date:  04/25/2002 Discharge Date: 04/28/2002                      Pulmonary Function Test Inter.  RESULTS: 1. Spirometry shows mild air flow obstruction most marked in the small    airways. 2. Lung volumes show very mild restrictive pattern and perhaps an associated    air trapping. 3. PLCO mildly reduced. 4. Arterial blood gases are normal. Dictated by:   Kari Baars, M.D. Attending Physician:  Syliva Overman DD:  05/15/02 TD:  05/18/02 Job: [id number]

## 2011-03-26 NOTE — Procedures (Signed)
[facility]  Patient:    JERRE, VANDRUNEN Visit Number: [id number] MRN: [id number]          Service Type: MED Location: 2A A220 01 Attending Physician:  Syliva Overman Dictated by:   [place] Bing, M.D. Proc. Date: 04/26/02 Admit Date:  04/25/2002 Discharge Date: 04/28/2002                              Echocardiograms  REFERRING:  Syliva Overman, M.D. and [place] Bing, M.D.  CLINICAL DATA:  A 70 year old gentleman with dyspnea and a history of hypertension.  1. Technically adequate echocardiographic study. 2. Moderately enlarged left atrium.  Normal right atrial size.  Normal right    ventricular size and function with mild RVH. 3. Mild aortic valvular sclerosis with mild insufficiency, minimal annular    calcification. 4. Normal mitral valve with mild annular calcification. 5. Normal tricuspid and pulmonic valves. 6. Normal internal dimension of the left ventricle with mild hypertrophy, most    prominent in the proximal septum.  There appears to be mild hypokinesis at    the base of the inferior wall.  Overall LV systolic function is normal. 7. Normal IVC. 8. Comparison with prior study of April 21, 2001:  No significant interval    change. Dictated by:   [place] Bing, M.D. Attending Physician:  Syliva Overman DD:  04/26/02 TD:  04/29/02 Job: [id number]

## 2011-03-30 NOTE — Consult Note (Signed)
[facility]  Patient:    Oscar, Hickman Visit Number: [id number] MRN: [id number]          Service Type: OBV Location: ICCU IC03 01 Attending Physician:  Oscar Hickman Dictated by:   Oscar Hickman, M.D. Proc. Date: 04/26/02 Admit Date:  04/25/2002                            Consultation Report  REFERRING PHYSICIAN:  Syliva Hickman, M.D.  HISTORY OF PRESENT ILLNESS:  Dr. Luther Hickman request for cardiology consultation is greatly appreciated concerning this 70 year old gentleman with no prior cardiopulmonary history who now presents with dyspnea at rest and on exertion. [name] describes a one year history of symptoms.  Air hunger is not prominent.  He describes inability to take a deep breath.  His level of exercise tolerance is unclear.  He does work as a Arboriculturist for [employer] with continued adequate functional status on the job.  He describes no chest pain.  He is unable to specify why he came to the emergency department last night after progressive symptoms over a long period of time.  There is a remote history of mild asthma.  He has multiple cardiovascular risk factors including age, male sex, hypertension, and hyperlipidemia.  PAST MEDICAL HISTORY:  Otherwise notable for anxiety, DJD, and bronchitis.  He was admitted last year for abdominal pain without a specific etiology identified.  He reports having seen a cardiologist in the past, but does not recall anyones name.  He did have an echocardiogram in June 2002 with normal LV systolic function.  CURRENT MEDICATIONS: 1. Atorvastatin 10 mg q.d. 2. Cozaar 100 mg q.d. 3. Chlorthalidone 25 mg q.d. 4. Clonidine 0.2 mg b.i.d.  SOCIAL HISTORY:  Lives in [place] with wife.  No history of tobacco or alcohol abuse.  Sedentary lifestyle.  FAMILY HISTORY:  Mother had unspecified heart problem and died at age 9. Details on family history are sparse.  REVIEW OF SYSTEMS:  Negative  in all instances.  PHYSICAL EXAMINATION  GENERAL:  Pleasant laconic gentleman in no acute distress.  VITAL SIGNS:  Weight 222, heart rate 70 and regular, blood pressure 160/80.  HEENT:  Prominent eyes.  Anicteric sclerae.  NECK:  No jugular venous distention.  No carotid bruits.  HEMATOPOIETIC:  No adenopathy.  ENDOCRINE:  No thyromegaly.  SKIN:  No significant lesions.  LUNGS:  Few scattered rhonchi.  No rales.  CARDIAC:  Normal first and second heart sounds.  Fourth heart sound present.  ABDOMEN:  Soft and nontender.  No organomegaly.  EXTREMITIES:  Bounding distal pulses.  No edema.  NEUROMUSCULAR:  Symmetric strength and tone.  LABORATORIES:  Chest x-ray:  Mild cardiomegaly.  No acute abnormalities.  CT scan of the chest negative for pulmonary embolism.  EKG:  Normal sinus rhythm, left anterior fascicular block, lateral T-wave abnormality, borderline left atrial abnormality.  Initial cardiac markers demonstrated borderline troponin, elevated CPK with elevated MB, but normal index.  IMPRESSION:  Long-standing nonspecific symptoms with unclear exacerbation prompting emergency department evaluation.  Borderline cardiac markers and nonspecific EKG abnormality.  Additional determinations of CK-MB and troponin to be obtained.  Echocardiogram requested to evaluate left ventricular systolic function.  BNP level has been ordered to exclude occult congestive heart failure.  TSH level pending.  Hypothyroidism could account for his flat affect and CPK abnormalities.  I suspect a stress Cardiolite study will be required to sort out possible  ischemic heart disease, but will defer performing this test until initial studies have been completed. Dictated by:   Oscar Hickman, M.D. Attending Physician:  Oscar Hickman DD:  04/26/02 TD:  04/27/02 Job: [id number]

## 2011-03-30 NOTE — Consult Note (Signed)
NAME:  Oscar Hickman, Oscar Hickman             ACCOUNT NO.:  [id number]   MEDICAL RECORD NO.:  [id number]          PATIENT TYPE:  EMS   LOCATION:  ED                            FACILITY:  [facility]   PHYSICIAN:  Edward L. Juanetta Gosling, M.D.DATE OF BIRTH:  05-Aug-1941   DATE OF CONSULTATION:  DATE OF DISCHARGE:                                   CONSULTATION   HISTORY OF PRESENT ILLNESS:  Oscar Hickman is a 70 year old with a 25 plus  year history of hypertension.  He had been in his usual state of fairly poor  health at home and said that he developed a headache about three weeks ago.  He then said that he felt funny last night.  He had some pain that was in  his right shoulder.  He went to bed last night, decided to go ahead and go  to sleep, slept well and then came to the emergency room today because of  his symptoms that he had last night.  When he was evaluated in he emergency  room, he was markedly hypertensive, received labetalol with fairly poor  results.  [name] has, as mentioned, a very long history of  hypertension and he has had a great deal of difficulty in maintaining any  sort of compliance with his medical regimen.  He has been getting samples  from my office of Lotrel 10/20 and of Hyzaar 100/25.  He has been buying  Catapres 0.2 b.i.d., but he has run out of both the Lotrel and the Hyzaar  about a month ago and has not come by for anymore.  He has been worked up  here.  His white blood count is 8200, hemoglobin 14.1.  His electrolytes  show potassium 3.5, BUN 18, creatinine 0.9, BNP 80.9.   PHYSICAL EXAMINATION:  CHEST:  Fairly clear.  HEART:  Regular without gallops.  NEUROLOGICAL:  No abnormalities.  He does not have papilledema, but he does  complain of a headache.   EMERGENCY ROOM COURSE:  His pressure has come down some, but I think he is  going to have to have more treatment.  I think it is okay to discharge him  from the emergency room, but I am going to have him go ahead and  get Lasix  40 mg IV, Vicodin 5/500 one now to help with his headache, Norvasc 10 mg  now, Lotensin 20 mg now which is the same thing as his Lotrel 10/20.  After  above, he can be discharged and come back to follow up in my office.  These  complaints, although he is here in the emergency room, are chronic.  His  blood pressure has been a severe chronic problem, and unfortunately, he has  difficulty in maintaining his medical regimen.  I am hopeful that we will be  able to get him some help to maintain his medical regimen, but I do not  believe he needs to be admitted to the hospital at this point.  I discussed  this at length with Mr. Felten.  He agrees he does not want to stay in the  hospital,  so we are going to go ahead and plan for discharge and come by my  office tomorrow to pick up samples.  He is going to go ahead and get the  medications now.      Edward L. Juanetta Gosling, M.D.  Electronically Signed     ELH/MEDQ  D:  07/15/2005  T:  07/16/2005  Job:  [id number]

## 2011-03-30 NOTE — Discharge Summary (Signed)
[facility]  Patient:    Oscar Hickman, COUDRIET Visit Number: [id number] MRN: [id number]          Service Type: MED Location: 2A A220 01 Attending Physician:  Syliva Overman Dictated by:   Syliva Overman, M.D. Admit Date:  04/25/2002 Discharge Date: 04/28/2002   CC:         Dr. Cathleen Corti, M.D.   Discharge Summary  DISCHARGE DIAGNOSES: 1. Shortness of breath secondary to bronchitis and probable chronic lung    disease. 2. Uncontrolled hypertension. 3. Hyperlipidemia. 4. Obesity. 5. Mildly abnormal cardiac enzymes with no evidence of coronary artery    disease, status post Cardiolite testing and echocardiogram.  HISTORY OF PRESENT ILLNESS:  The patient is a 70 year old, African-American male who presents with a one-year history of shortness of breath which is getting progressively worse.  He reported that over the week prior to this admission, he could not event take a deep breath and he reported significant exertional dyspnea.  He did give a report of a cough productive of clear sputum over the past several days.  He denied any fevers or chills.  The patient reports that he was seen by his primary M.D., Dr. Loleta Chance, three days prior to being admitted.  He states he was treated for presumed bronchitis. He said, however, his breathing got worse and he then went to the emergency room for help.  He does give a past history of using an inhaler and history of wheezing.  He use to be seen by Dr. Juanetta Gosling in the past.  PAST MEDICAL HISTORY: 1. Hypertension. 2. Anxiety. 3. Degenerative joint disease. 4. Hyperlipidemia. 5. Elevated PSA in June 2002.  At that time, it was noted to be 9.1.  However,    it is unclear whether or not he has been further evaluated for the same.  SOCIAL HISTORY:  The patient lives with his wife of 30 years in [place]. He denies any current alcohol, tobacco or illicit drug use.  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  He does not wear a mask on the job.  FAMILY HISTORY:  Mother died at age 76, cause unknown.  Father died at age 69 of cancer.  He has four sons and one daughter living.  MEDICATIONS: 1. Lipitor 10 mg daily. 2. Cozaar 100 mg daily. 3. Chlorthalidone 25 mg daily. 4. Clonidine 0.2 mg twice daily. 5. Artificial tears. 6. Stool softener as needed.  PHYSICAL EXAMINATION:  GENERAL:  He was in no obvious respiratory distress.  VITAL SIGNS:  Blood pressure was markedly elevated at 174/110, temperature 98.7, heart rate 72, respiratory rate 20.  HEENT:  Negative for facial asymmetry.  Extraocular movements intact.  He has mild bilateral exophthalmos which is probably generic.  Oropharynx is moist.  NECK:  Supple.  No bruits heard with no JVD.  CHEST:  Revealed adequate air entry, no crackles were heard.  A few mild wheezes.  CARDIAC:  S1, S2, no murmurs or S3.  ABDOMEN:  Obese, soft, nontender.  No palpable organomegaly or mass.  RECTAL:  Guaiac negative stool.  EXTREMITIES:  Negative for edema or ulcers.  LABORATORY DATA AND X-RAY FINDINGS:  White cell count 7.5, hemoglobin 12.9, platelets 215.  D-dimer less than 0.25.  Sodium 239, potassium low at 2.7. Chloride 105, CO2 31, BUN 20, creatinine 1.1, glucose 127, calcium 9.0. Initial cardiac enzymes had a CK-MB of 14.6 with a troponin  I of 0.05.  Blood gasses drawn in the emergency room on 2 L of oxygen and a pO2 of 78, pCO2 42.1, pH 7.44.  EKG was reportedly unchanged from a previous EKG from Summer of 2002, which showed a normal sinus rhythm with rate of 70, except that there were inverted T waves in the lateral leads unchanged with mild elevation of ST segment also unchanged.  No sign of acute ischemia on current EKG.  Spiral CT scan of the chest was done to rule out a pulmonary embolus and this was negative.  HOSPITAL COURSE:  The patient was admitted to intensive care.   He had been started on a nitroglycerin drip in the emergency room with two thoughts in mind, one in case he did have cardiac ischemia and two, to assist in control of his blood pressure.  #1 - CARDIOVASCULAR:  Cardiology had been involved with the patient even prior to his being admitted as the emergency room physician reported that he had discussed with cardiologist in view of direct transfer.  However, they did not think it necessary at the time.  [facility] cardiology group followed him while in hospital here and he had a stress Cardiolite test done which showed no evidence of ischemia.  His blood pressure was controlled at the time of his discharge.  He required significant increase in his blood pressure medications.  On the day of his discharge, his blood pressure was 129/80.  At no time throughout his hospital course, did he complain of any chest pain nor did he have any nausea or diaphoretic episodes.  He was able to ambulate without any complaint of chest pain.  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  He will have lung function testing done as an outpatient and will be evaluated by the pulmonologist of his choice with Dr. Juanetta Gosling here in [place] as an outpatient.  It was repeated that he should wear a mask on the job.  However, he started to say that he was intolerant of masks.  His room air blood gas was drawn on admission and the results are as follows:  His pH was 7.44, pCO2 42.3, pO2 78.3.  He did have an ABG done on the day of his discharge which showed a pH of 7.45, pCO2 35.1, pO2 67.0.  The patients GI and neurologic system remains stable throughout the hospitalization.  He was discharged home in stable and improved condition.   DISCHARGE MEDICATIONS: 1. Norvasc 10 mg one daily. 2. Catapres-TTS-III two to be applied each week. 3.  Chlorthalidone 25 mg one daily. 4. Cozaar 100 mg one daily. 5. Lipitor 10 mg one at bedtime. 6. Clonidine 0.2 mg twice daily. 7. Baby aspirin daily.  FOLLOWUP:  He will be followed by Dr. Katrinka Blazing, the primary physician of his choice, and evaluated on May 03, 2002, at 1 p.m.  He needs to have lung function testing done as an outpatient and will be seen by Dr. Juanetta Gosling, date to be determined, but within the next 1-1/2 weeks.  SPECIAL INSTRUCTIONS:  He will be excused from work until May 08, 2002. Dictated by:   Syliva Overman, M.D. Attending Physician:  Syliva Overman DD:  04/29/02 TD:  05/01/02 Job: [id number]

## 2011-03-30 NOTE — Group Therapy Note (Signed)
[facility]  Patient:    Oscar Hickman, Oscar Hickman Visit Number: [id number] MRN: [id number]          Service Type: MED Location: 2A A220 01 Attending Physician:  Syliva Overman Dictated by:   Syliva Overman, M.D. Proc. Date: 04/28/02 Admit Date:  04/25/2002 Discharge Date: 04/28/2002                               Progress Note  SUBJECTIVE:  Oscar Hickman states that he feels somewhat weak this morning.  He attributes this primarily to hunger.  He denies any headache or chest pain. He still says that he has exertional dyspnea with minimal activity; however, he does say that in the past he was very active, and over the years he has reduced his activity, with a weight gain and fatigue.  OBJECTIVE:  GENERAL:  An elderly male lying flat in bed, in no apparent pulmonary distress.  VITAL SIGNS:  Temperature 97.6 degrees, pulse 73, respirations 20, blood pressure 121/77.  HEENT/NECK:  The neck is supple. Extraocular muscles intact.  Oropharynx is moist.  No facial asymmetry. CHEST: Adequate air entry throughout.  No crackles or wheezes heard. CARDIOVASCULAR:  Heart sounds 1 and 2, no murmurs or S3.  ABDOMEN: Obese, soft, nontender.  No guarding or rebound.  No organomegaly or masses. EXTREMITIES:  Negative for edema or ulcers.  LABORATORY DATA:  His chemistry today shows sodium of 134, potassium 3.5, chloride 99, CO2 of 29, BUN 15, creatinine 1.1, glucose 125.  ASSESSMENT AND PLAN: 1. The patient is clinically stable for a discharge home today, with followup and further evaluation of his pulmonary status.  His cardiac enzymes have been negative. As the reason for his fatigue, he will have pulmonary function testing done as an outpatient, as well as be referred to a pulmonologist for their impression of his symptoms.  He has been advised to be to wear ______ mask on his job.  2. Severe hypertension, well controlled on the current regimen.  There have been  medication adjustments while in the hospital, with the addition of Norvasc 10 mg one daily, and Catapres TTS-3, two patches to be applied weekly, with ___________ with all his ________ and hypertensive agents.  3. Hyperlipidemia.  Continue Lipitor 10 mg daily.  His hepatic panel is normal.  His fasting lipid panel is pending at the time of this dictation.  He is to start a baby aspirin daily, and has been advised to start an exercise routine, graduating it as tolerated.  He has also been advised to use a a mask at work. Dictated by:   Syliva Overman, M.D. Attending Physician:  Syliva Overman DD:  04/28/02 TD:  04/30/02 Job: [id number]

## 2011-03-30 NOTE — Discharge Summary (Signed)
[facility]  Patient:    Oscar Hickman, Oscar Hickman Visit Number: [id number] MRN: [id number]          Service Type: OBV Location: ICCU IC03 01 Attending Physician:  Hilario Quarry Dictated by:   Syliva Overman, M.D. Admit Date:  04/25/2002                             Discharge Summary  CHIEF COMPLAINT:  The patient is a 70 year old African-American male who presented with a one-year history of shortness of breath.  However, he reports that over the past one week, it has been significantly worse.  He reported that he cannot take a deep breath, he reports exertional dyspnea, and he does give a report of a cough productive of clear sputum over the past several days.  He denied any fever or chills.  He reports that he was seen by his primary physician, Dr. Loleta Chance, three days prior to being admitted.  At that time, he states he was treated for presumed bronchitis - pneumonia.  However, he said that his breathing got worse and he had to report to the emergency room for help.  He does give a past history of using an inhaler and a past history of wheezing.  PAST MEDICAL HISTORY:  Significant for hypertension, anxiety, degenerative joint disease, hyperlipidemia, and an elevated PSA in June 2002.  At that time, it was noted to be 9.1.  However, he has not had further evaluation for same.  SOCIAL HISTORY:  The patient lives with his wife of 30 years in [place]. He denies any current alcohol, tobacco, or illicit drug use.  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  He does not wear a mask on the job.  FAMILY HISTORY:  His mother died at age 52, cause unknown.  Father died at 71 of cancer.  He has four sons and one daughter living.  MEDICATIONS AT THIS TIME: 1. Lipitor 10 mg at bedtime. 2. Cozaar 100 mg one daily. 3. Chlorthalidone 25 mg one daily. 4. Clonidine 0.2 mg twice daily. 5. Artificial Tears and  a stool softener as needed.  PHYSICAL EXAMINATION:  GENERAL:  At the time I examined him, he was lying flat in bed in no obvious respiratory distress.  He had significant hypertension.  VITAL SIGNS:  His temperature was 98.7, heart rate 72, respirations 20.  Blood pressure was 174/110.  HEENT:  There was no facial asymmetry.  Extraocular muscles were intact. Oropharynx moist.  NECK:  Exam revealed no bruits.  There was no JVD.  CHEST:  Exam revealed adequate air entry throughout.  There were no crackles or wheezes heard.  CARDIOVASCULAR:  Heart sounds 1 and 2.  No murmurs or S3.  ABDOMEN:  Obese, soft, and nontender.  No palpable organomegaly or masses. Bowel sounds were present and normal.  RECTAL:  Exam revealed guaiac-negative stool per emergency room reporting.  EXTREMITIES:  Exam was negative for edema or ulcers.  LABORATORY DATA ON ADMISSION:  CBC - white cell count is 7.5, hemoglobin 12.9, and platelet count 215.  D-dimer is less than 0.25.  Sodium was 139, potassium was low at 2.7, chloride 105, CO2 31, BUN 20, creatinine 1.0, glucose 127, calcium 9.0.  Her initial set of cardiac enzymes showed a CK-MB of 14.6 with a troponin I of 0.05.  Room air blood  gas showed a pH of 7.44 with a CO2 of 42.3, PO2 of 78.3.  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  No sign of acute ischemia on the current EKG.  Patient had a spiral CT scan of his chest for rule out P and this was negative.  HOSPITAL COURSE: #1 - SHORTNESS OF BREATH:  The patient is admitted to intensive care where he is receiving intravenous nitroglycerin at 30 mcg per minute.  He is also anticoagulated on Lovenox q.12h. and receiving supplemental oxygen.  He is on bed rest and the standard  protocol to rule out acute myocardial ischemia is in place.  Cardiology is involved in his care and he is scheduled for both an echocardiogram as well as a stress Cardiolite to further evaluate any possible coronary artery disease.  His blood pressure currently is inadequately controlled; however, I will resume his outpatient medications and we will continue to address this issue.  #2 - ELEVATED PROSTATE SPECIFIC ANTIGEN:  In the past, the patient has not been amenable to having this worked up any further and at this time, this will be carried out as an outpatient once his cardiac issues have been addressed.  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  If his cardiac workup is totally negative, he will have formal lung functioning testing done as an outpatient.  #4 - HYPERLIPIDEMIA:  Patient will have a fasting lipid panel done.  In the meantime, we will continue him on Lipitor 10 mg.  He will also have his thyroid function done as well as a BMP.  #5 - HYPOKALEMIA:  Patient has been receiving aggressive potassium supplementation both parenterally and orally and his chemistry will be followed closely until this is corrected. Dictated by:   Syliva Overman, M.D. Attending Physician:  Hilario Quarry DD:  04/26/02 TD:  04/27/02 Job: [id number]

## 2011-05-05 ENCOUNTER — Ambulatory Visit (HOSPITAL_COMMUNITY): Payer: Medicare HMO

## 2011-05-13 ENCOUNTER — Ambulatory Visit (HOSPITAL_COMMUNITY): Payer: Medicare HMO

## 2011-05-14 ENCOUNTER — Ambulatory Visit (HOSPITAL_COMMUNITY): Payer: Medicare HMO

## 2011-05-17 ENCOUNTER — Encounter (HOSPITAL_COMMUNITY): Payer: Self-pay

## 2011-05-18 ENCOUNTER — Other Ambulatory Visit (HOSPITAL_COMMUNITY): Payer: Self-pay | Admitting: Oncology

## 2011-05-18 ENCOUNTER — Encounter (HOSPITAL_COMMUNITY): Payer: Medicare HMO | Attending: Oncology

## 2011-05-18 VITALS — BP 139/85 | HR 73

## 2011-05-18 DIAGNOSIS — Z5111 Encounter for antineoplastic chemotherapy: Secondary | ICD-10-CM

## 2011-05-18 DIAGNOSIS — C61 Malignant neoplasm of prostate: Secondary | ICD-10-CM

## 2011-05-18 MED ORDER — LEUPROLIDE ACETATE (3 MONTH) 22.5 MG IM KIT
22.5000 mg | PACK | Freq: Once | INTRAMUSCULAR | Status: DC
  Filled 2011-05-18: qty 22.5

## 2011-05-18 NOTE — Progress Notes (Signed)
Oscar Hickman presents today for injection per MD orders. Depo Lupron 22.5mg  administered IM in left buttocks. Administration without incident. Patient tolerated well.

## 2011-06-08 ENCOUNTER — Encounter (HOSPITAL_COMMUNITY): Payer: Self-pay

## 2011-07-30 LAB — GC/CHLAMYDIA PROBE AMP, GENITAL: Chlamydia, DNA Probe: NEGATIVE

## 2011-07-30 LAB — URINALYSIS, ROUTINE W REFLEX MICROSCOPIC
Bilirubin Urine: NEGATIVE
Glucose, UA: NEGATIVE
Hgb urine dipstick: NEGATIVE
Specific Gravity, Urine: 1.015
Urobilinogen, UA: 0.2

## 2011-08-02 LAB — URINE MICROSCOPIC-ADD ON

## 2011-08-02 LAB — URINALYSIS, ROUTINE W REFLEX MICROSCOPIC
Bilirubin Urine: NEGATIVE
Bilirubin Urine: NEGATIVE
Glucose, UA: NEGATIVE
Hgb urine dipstick: NEGATIVE
Ketones, ur: NEGATIVE
Leukocytes, UA: NEGATIVE
Nitrite: NEGATIVE
Nitrite: NEGATIVE
Protein, ur: NEGATIVE
Specific Gravity, Urine: 1.015
Specific Gravity, Urine: <1.005 — ABNORMAL LOW
Urobilinogen, UA: 0.2
Urobilinogen, UA: 0.2
pH: 6
pH: 7

## 2011-08-02 LAB — CBC
HCT: 40.3
Hemoglobin: 13.7
MCHC: 34
MCV: 87.6
MCV: 88.1
Platelets: 203
Platelets: 244
RBC: 4.33
RBC: 4.57
RDW: 13.6
WBC: 10.7 — ABNORMAL HIGH
WBC: 9.4

## 2011-08-02 LAB — DIFFERENTIAL
Basophils Absolute: 0
Basophils Relative: 0
Basophils Relative: 1
Eosinophils Absolute: 0.1
Eosinophils Absolute: 0.1
Eosinophils Relative: 1
Lymphocytes Relative: 13
Lymphs Abs: 1.4
Lymphs Abs: 1.8
Monocytes Absolute: 0.8
Monocytes Relative: 8
Neutro Abs: 6.5
Neutro Abs: 8.3 — ABNORMAL HIGH
Neutrophils Relative %: 68
Neutrophils Relative %: 78 — ABNORMAL HIGH

## 2011-08-02 LAB — BASIC METABOLIC PANEL
BUN: 7
CO2: 30
Calcium: 8.8
Calcium: 9.6
Creatinine, Ser: 0.95
GFR calc non Af Amer: >60
GFR calc non Af Amer: >60
Glucose, Bld: 156 — ABNORMAL HIGH
Glucose, Bld: 184 — ABNORMAL HIGH
Sodium: 139

## 2011-08-02 LAB — COMPREHENSIVE METABOLIC PANEL
AST: 28
Albumin: 3.8
Alkaline Phosphatase: 87
BUN: 14
CO2: 28
Chloride: 100
Creatinine, Ser: 1.04
GFR calc non Af Amer: >60
Potassium: 3.4 — ABNORMAL LOW
Total Bilirubin: 0.8

## 2011-08-02 LAB — COMPREHENSIVE METABOLIC PANEL WITH GFR
ALT: 23
Calcium: 10.1
GFR calc Af Amer: >60
Glucose, Bld: 148 — ABNORMAL HIGH
Sodium: 137
Total Protein: 7

## 2011-08-02 LAB — LIPASE, BLOOD
Lipase: 173 — ABNORMAL HIGH
Lipase: 51
Lipase: 70 — ABNORMAL HIGH

## 2011-08-02 LAB — AMYLASE: Amylase: 134 — ABNORMAL HIGH

## 2011-08-02 LAB — PSA: PSA: 77.93 — ABNORMAL HIGH

## 2011-08-03 LAB — BASIC METABOLIC PANEL
BUN: 7
CO2: 28
CO2: 31
Calcium: 8.6
Calcium: 8.9
Creatinine, Ser: 0.85
Creatinine, Ser: 0.92
Glucose, Bld: 136 — ABNORMAL HIGH
Glucose, Bld: 141 — ABNORMAL HIGH

## 2011-08-04 LAB — BASIC METABOLIC PANEL
BUN: 23
CO2: 29
Calcium: 9
Calcium: 9.9
Creatinine, Ser: 0.79
Creatinine, Ser: 1.1
GFR calc Af Amer: >60
GFR calc non Af Amer: >60
GFR calc non Af Amer: >60
Glucose, Bld: 173 — ABNORMAL HIGH
Glucose, Bld: 198 — ABNORMAL HIGH
Potassium: 3.9
Sodium: 138

## 2011-08-04 LAB — URINALYSIS, ROUTINE W REFLEX MICROSCOPIC
Ketones, ur: NEGATIVE
Nitrite: NEGATIVE
Protein, ur: NEGATIVE
pH: 6

## 2011-08-04 LAB — DIFFERENTIAL
Basophils Absolute: 0
Basophils Absolute: 0
Basophils Relative: 1
Eosinophils Relative: 3
Lymphocytes Relative: 31
Neutro Abs: 4.7
Neutro Abs: 4.8
Neutrophils Relative %: 64

## 2011-08-04 LAB — CBC
HCT: 39.7
MCHC: 34.3
Platelets: 246
Platelets: 312
RDW: 14.2
RDW: 14.3

## 2011-08-05 LAB — URINALYSIS, ROUTINE W REFLEX MICROSCOPIC
Nitrite: NEGATIVE
Specific Gravity, Urine: 1.01
Urobilinogen, UA: 0.2
pH: 6

## 2011-08-05 LAB — URINE CULTURE

## 2011-08-05 LAB — URINE MICROSCOPIC-ADD ON

## 2011-08-19 LAB — POCT CARDIAC MARKERS
CKMB, poc: <1 — ABNORMAL LOW
CKMB, poc: <1 — ABNORMAL LOW
Operator id: 216461
Operator id: 216461
Troponin i, poc: <0.05
Troponin i, poc: <0.05

## 2011-08-19 LAB — DIFFERENTIAL
Lymphocytes Relative: 32
Lymphs Abs: 2.3
Monocytes Absolute: 0.6
Monocytes Relative: 8
Neutro Abs: 4
Neutrophils Relative %: 56

## 2011-08-19 LAB — CBC
Hemoglobin: 14.6
MCHC: 33.2
RBC: 4.92
WBC: 7.1

## 2011-08-19 LAB — BASIC METABOLIC PANEL
CO2: 30
Calcium: 9.6
GFR calc Af Amer: >60
Sodium: 137

## 2011-08-23 LAB — URINALYSIS, ROUTINE W REFLEX MICROSCOPIC
Bilirubin Urine: NEGATIVE
Hgb urine dipstick: NEGATIVE
Ketones, ur: NEGATIVE
Nitrite: NEGATIVE
pH: 6

## 2011-08-23 LAB — CBC
Hemoglobin: 13.9
MCHC: 33.2
MCV: 87.9
RDW: 14.2 — ABNORMAL HIGH

## 2011-08-23 LAB — COMPREHENSIVE METABOLIC PANEL
ALT: 19
BUN: 19
CO2: 29
Calcium: 9.6
Creatinine, Ser: 1.03
GFR calc non Af Amer: >60
Glucose, Bld: 125 — ABNORMAL HIGH
Sodium: 137
Total Protein: 7.3

## 2011-08-23 LAB — LIPASE, BLOOD: Lipase: 29

## 2011-08-23 LAB — DIFFERENTIAL
Lymphocytes Relative: 34
Lymphs Abs: 2.4
Monocytes Relative: 9
Neutro Abs: 3.9
Neutrophils Relative %: 53

## 2011-08-23 LAB — POCT CARDIAC MARKERS
CKMB, poc: 3.1
Myoglobin, poc: 101
Operator id: 230251
Troponin i, poc: <0.05

## 2011-09-07 ENCOUNTER — Encounter (HOSPITAL_COMMUNITY): Payer: Medicare HMO | Attending: Oncology

## 2011-09-07 ENCOUNTER — Other Ambulatory Visit (HOSPITAL_COMMUNITY): Payer: Self-pay | Admitting: Oncology

## 2011-09-07 VITALS — BP 145/82 | HR 72 | Temp 98.5°F | Wt 195.0 lb

## 2011-09-07 DIAGNOSIS — Z5111 Encounter for antineoplastic chemotherapy: Secondary | ICD-10-CM

## 2011-09-07 DIAGNOSIS — C61 Malignant neoplasm of prostate: Secondary | ICD-10-CM | POA: Insufficient documentation

## 2011-09-07 LAB — COMPREHENSIVE METABOLIC PANEL
AST: 12 U/L (ref 0–37)
Albumin: 3.7 g/dL (ref 3.5–5.2)
Alkaline Phosphatase: 97 U/L (ref 39–117)
BUN: 23 mg/dL (ref 6–23)
Chloride: 104 mEq/L (ref 96–112)
Creatinine, Ser: 1.4 mg/dL — ABNORMAL HIGH (ref 0.50–1.35)
Potassium: 3.8 mEq/L (ref 3.5–5.1)
Total Bilirubin: 0.2 mg/dL — ABNORMAL LOW (ref 0.3–1.2)
Total Protein: 7.3 g/dL (ref 6.0–8.3)

## 2011-09-07 LAB — CBC
HCT: 33.3 % — ABNORMAL LOW (ref 39.0–52.0)
MCHC: 33.3 g/dL (ref 30.0–36.0)
Platelets: 312 10*3/uL (ref 150–400)
RDW: 15.3 % (ref 11.5–15.5)
WBC: 5 10*3/uL (ref 4.0–10.5)

## 2011-09-07 MED ORDER — LEUPROLIDE ACETATE (3 MONTH) 22.5 MG IM KIT
22.5000 mg | PACK | Freq: Once | INTRAMUSCULAR | Status: AC
  Administered 2011-09-07: 22.5 mg via INTRAMUSCULAR
  Filled 2011-09-07: qty 22.5

## 2011-09-07 NOTE — Progress Notes (Signed)
VSS.  Depo-Lupron 22.5 mg im z-track to left gluteus. Tolerated well.

## 2011-09-08 LAB — PSA: PSA: 0.05 ng/mL — ABNORMAL LOW (ref <=4.00)

## 2011-09-23 ENCOUNTER — Other Ambulatory Visit: Payer: Self-pay | Admitting: Cardiology

## 2011-09-24 ENCOUNTER — Other Ambulatory Visit: Payer: Self-pay | Admitting: *Deleted

## 2011-09-24 DIAGNOSIS — I10 Essential (primary) hypertension: Secondary | ICD-10-CM

## 2011-09-24 MED ORDER — HYDRALAZINE HCL 50 MG PO TABS: 100.0000 mg | ORAL_TABLET | Freq: Three times a day (TID) | ORAL | Status: DC

## 2011-09-24 MED ORDER — CARVEDILOL 6.25 MG PO TABS: 25.0000 mg | ORAL_TABLET | Freq: Two times a day (BID) | ORAL | Status: DC

## 2011-10-05 ENCOUNTER — Encounter (HOSPITAL_COMMUNITY): Payer: Medicare HMO | Attending: Oncology

## 2011-10-05 ENCOUNTER — Encounter (HOSPITAL_COMMUNITY): Payer: Medicare HMO | Admitting: Oncology

## 2011-10-05 ENCOUNTER — Encounter (HOSPITAL_COMMUNITY): Payer: Medicare HMO

## 2011-10-05 DIAGNOSIS — C61 Malignant neoplasm of prostate: Secondary | ICD-10-CM | POA: Insufficient documentation

## 2011-10-05 LAB — CBC
HCT: 31.5 % — ABNORMAL LOW (ref 39.0–52.0)
Hemoglobin: 10.3 g/dL — ABNORMAL LOW (ref 13.0–17.0)
MCV: 86.5 fL (ref 78.0–100.0)
RDW: 15.3 % (ref 11.5–15.5)
WBC: 6.9 10*3/uL (ref 4.0–10.5)

## 2011-10-05 LAB — DIFFERENTIAL
Eosinophils Relative: 7 % — ABNORMAL HIGH (ref 0–5)
Lymphocytes Relative: 19 % (ref 12–46)
Monocytes Absolute: 0.6 10*3/uL (ref 0.1–1.0)
Monocytes Relative: 9 % (ref 3–12)
Neutro Abs: 4.4 10*3/uL (ref 1.7–7.7)

## 2011-10-05 NOTE — Progress Notes (Signed)
Labs drawn today for cbc/diff,psa 

## 2011-10-11 NOTE — Progress Notes (Signed)
No show

## 2011-11-15 ENCOUNTER — Ambulatory Visit (HOSPITAL_COMMUNITY): Payer: Medicare HMO

## 2011-11-15 ENCOUNTER — Ambulatory Visit (HOSPITAL_COMMUNITY): Payer: Medicare HMO | Admitting: Oncology

## 2011-11-15 ENCOUNTER — Other Ambulatory Visit (HOSPITAL_COMMUNITY): Payer: Medicare HMO

## 2011-11-23 ENCOUNTER — Encounter (HOSPITAL_COMMUNITY): Payer: Self-pay | Admitting: Oncology

## 2011-11-23 ENCOUNTER — Encounter (HOSPITAL_COMMUNITY): Payer: Medicare HMO | Attending: Oncology | Admitting: Oncology

## 2011-11-23 ENCOUNTER — Encounter (HOSPITAL_BASED_OUTPATIENT_CLINIC_OR_DEPARTMENT_OTHER): Payer: Medicare HMO

## 2011-11-23 VITALS — BP 127/73 | HR 82 | Temp 97.9°F | Wt 197.8 lb

## 2011-11-23 DIAGNOSIS — Z5111 Encounter for antineoplastic chemotherapy: Secondary | ICD-10-CM

## 2011-11-23 DIAGNOSIS — C61 Malignant neoplasm of prostate: Secondary | ICD-10-CM

## 2011-11-23 DIAGNOSIS — I10 Essential (primary) hypertension: Secondary | ICD-10-CM

## 2011-11-23 LAB — COMPREHENSIVE METABOLIC PANEL
ALT: 8 U/L (ref 0–53)
BUN: 21 mg/dL (ref 6–23)
CO2: 25 mEq/L (ref 19–32)
Calcium: 9.9 mg/dL (ref 8.4–10.5)
Creatinine, Ser: 1.05 mg/dL (ref 0.50–1.35)
GFR calc Af Amer: 82 mL/min — ABNORMAL LOW (ref >90)
GFR calc non Af Amer: 70 mL/min — ABNORMAL LOW (ref >90)
Glucose, Bld: 134 mg/dL — ABNORMAL HIGH (ref 70–99)
Total Protein: 7.3 g/dL (ref 6.0–8.3)

## 2011-11-23 LAB — CBC
HCT: 32 % — ABNORMAL LOW (ref 39.0–52.0)
Hemoglobin: 10.6 g/dL — ABNORMAL LOW (ref 13.0–17.0)
MCH: 28.6 pg (ref 26.0–34.0)
MCHC: 33.1 g/dL (ref 30.0–36.0)
MCV: 86.3 fL (ref 78.0–100.0)
RBC: 3.71 MIL/uL — ABNORMAL LOW (ref 4.22–5.81)

## 2011-11-23 LAB — DIFFERENTIAL
Basophils Absolute: 0 10*3/uL (ref 0.0–0.1)
Basophils Relative: 0 % (ref 0–1)
Eosinophils Absolute: 0.4 10*3/uL (ref 0.0–0.7)
Eosinophils Relative: 8 % — ABNORMAL HIGH (ref 0–5)

## 2011-11-23 MED ORDER — LEUPROLIDE ACETATE (3 MONTH) 22.5 MG IM KIT
22.5000 mg | PACK | Freq: Once | INTRAMUSCULAR | Status: DC
  Filled 2011-11-23: qty 22.5

## 2011-11-23 MED ORDER — LEUPROLIDE ACETATE (3 MONTH) 22.5 MG IM KIT
22.5000 mg | PACK | Freq: Once | INTRAMUSCULAR | Status: AC
  Administered 2011-11-23: 22.5 mg via INTRAMUSCULAR
  Filled 2011-11-23: qty 22.5

## 2011-11-23 NOTE — Progress Notes (Signed)
[name], MD [address] [place] [address]  1. ADENOCARCINOMA, PROSTATE  SCHEDULING COMMUNICATION INJECTION, DISCONTINUED: leuprolide (LUPRON) injection 22.5 mg  2. HTN (hypertension)      CURRENT THERAPY:Depo-Lupon 22.5 mg every 12 weeks and Casodex daily.  S/P biopsy and fulguration March 2011.   S/P TURP followed by radiation in 2009.   INTERVAL HISTORY: LAMARCO GUDIEL 71 y.o. 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  Indwelling Foley catheter removed. Now on Depo-Lupro and Casodex.  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  The patient reports some urinary incontinence that is being followed and treated by PCP.  He reports that he is out of his Flomax which is managed by his PCP and he will get in contact with him for a new Rx.    Otherwise, the patient denies any complaints.  He reports that he rests or sleeps 50% of the daylight hours.  He reports that he feels strong and is walking nearly 1 mile daily and performs 104 push ups daily.  He flexed his pectoralis muscles to illustrate his strength.    The patient and I spent some time reviewing his chart.  His last CT of abd/pelvis was in August of 2011.  He has not had a bone scan since April 2011.  We performed some lab work and we will see what his PSA is.  If increased, will pursue the aforementioned staging studies.    The patient has not been here in nearly one year for follow-up appointment due to illnesses.    Otherwise, the patient denies any complaints.  Complete ROS questioning is negative.  He denies any pain.   The patient is aware that there are newer agents available if needed for his prostate cancer such as Zytiga and Xtandi.  It also looks like he has not received Taxotere chemotherapy which may be an  option in the future if needed pending his performance status.    Past Medical History  Diagnosis Date  . Hypertension   . Prostate ca   . Skin infection     history  . HTN (hypertension) 11/23/2011    has ADENOCARCINOMA, PROSTATE; DIABETES MELLITUS, TYPE II; HYPERLIPIDEMIA; and HTN (hypertension) on his problem list.      has no known allergies.  Mr. Peddie had no medications administered during this visit.  Past Surgical History  Procedure Date  . Cardiac catheterization 02/2009  . Transurethral resection of prostate 03/2008    Denies any headaches, dizziness, double vision, fevers, chills, night sweats, nausea, vomiting, diarrhea, constipation, chest pain, heart palpitations, shortness of breath, blood in stool, black tarry stool, urinary pain, urinary burning, urinary frequency, hematuria.   PHYSICAL EXAMINATION  ECOG PERFORMANCE STATUS: 3 - Symptomatic, >50% confined to bed  Filed Vitals:   11/23/11 1039  BP: 127/73  Pulse: 82  Temp: 97.9 F (36.6 C)    GENERAL:alert, no distress, well nourished, well developed, comfortable, cooperative, smiling SKIN: skin color, texture, turgor are normal, multiple SKs on face.   HEAD: Normocephalic, No masses, lesions, tenderness or abnormalities EYES: normal EARS: External ears normal OROPHARYNX:mucous membranes are moist  NECK: supple, no adenopathy, no bruits, thyroid normal size, non-tender, without nodularity, no stridor, non-tender, trachea midline LYMPH:  no palpable lymphadenopathy, no hepatosplenomegaly BREAST:not examined LUNGS: clear to auscultation and percussion HEART:  regular rate & rhythm, no murmurs, no gallops, S1 normal and S2 normal ABDOMEN:abdomen soft, non-tender, normal bowel sounds, no masses or organomegaly and no hepatosplenomegaly BACK: Back symmetric, no curvature., No CVA tenderness EXTREMITIES:less then 2 second capillary refill, no joint deformities, effusion, or inflammation, no edema, no skin  discoloration, no clubbing, no cyanosis  NEURO: alert & oriented x 3 with fluent speech, no focal motor/sensory deficits, wide gait    PENDING LABS: CBC diff, CMET, PSA    ASSESSMENT:  1. 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  Indwelling Foley catheter removed. Now on Depo-Lupro and Casodex.  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 2. 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 DM 4. HTN   PLAN:  1. We discussed the need for restaging scans.  The patient reports that he is not a fan of needles and would prefer to wait.  We will see what his PSA results are today and scheduled restaging scans accordingly.  2. If PSA increased, will perform Ct abd/pelvis with contrast and bone scan soon.  If not increased, will perform before his next follow-up appointment.  3. Patient will follow-up with PCP regarding refilling his Flomax. 4. Lab work in 3 months: CBC diff, CMET, PSA 5. Continue Lupron injections every 12 weeks and Casodex daily.  6. We discussed other options regarding his therapy if needed.  Again, we will see what his PSA is today.   7. Will perform Bone Scan and CT abd/pelvis with contrast within the next 2 weeks or before her next follow-up appointment pending lab results.  8. Return in 3 months for follow-up.   All questions were answered. The patient knows to call the clinic with any problems, questions or concerns. We can certainly see the patient much sooner if necessary.  The patient and plan discussed with Glenford Peers, MD and he is in agreement with the aforementioned.  I spent 20 minutes counseling the patient face to face. The total time spent in the appointment was 30 minutes.  Lynnea Vandervoort

## 2011-11-23 NOTE — Patient Instructions (Signed)
[facility]  Discharge Instructions  RECOMMENDATIONS MADE BY THE CONSULTANT AND ANY TEST RESULTS WILL BE SENT TO YOUR REFERRING DOCTOR.   EXAM FINDINGS BY MD TODAY AND SIGNS AND SYMPTOMS TO REPORT TO CLINIC OR PRIMARY MD: DepoLupron injection given today and it will be due again in 3 months. We will due lab work, give you your shot and have you see Dr.Neijstrom all on the same day in 3 months. Report any issues or concerns to the clinic as needed.     I acknowledge that I have been informed and understand all the instructions given to me and received a copy. I do not have any more questions at this time, but understand that I may call the Specialty Clinic at [facility] at [phone] during business hours should I have any further questions or need assistance in obtaining follow-up care.    __________________________________________  _____________  __________ Signature of Patient or Authorized Representative            Date                   Time    __________________________________________ Nurse's Signature

## 2011-11-23 NOTE — Progress Notes (Unsigned)
Oscar Hickman presents today for injection per MD orders. Lupron Depot 22.5 mg administered IM in left Gluteal. Administration without incident. Patient tolerated well.  

## 2011-11-23 NOTE — Progress Notes (Signed)
Labs drawn today for cbc,diff,cmp,psa 

## 2011-11-24 ENCOUNTER — Other Ambulatory Visit (HOSPITAL_COMMUNITY): Payer: Self-pay | Admitting: Oncology

## 2011-11-24 DIAGNOSIS — C61 Malignant neoplasm of prostate: Secondary | ICD-10-CM

## 2011-11-24 LAB — PSA: PSA: 0.55 ng/mL (ref <=4.00)

## 2011-12-01 ENCOUNTER — Encounter (HOSPITAL_COMMUNITY): Payer: Medicare HMO

## 2011-12-01 ENCOUNTER — Other Ambulatory Visit (HOSPITAL_COMMUNITY): Payer: Medicare HMO

## 2011-12-04 ENCOUNTER — Encounter (HOSPITAL_COMMUNITY): Payer: Self-pay

## 2011-12-04 ENCOUNTER — Emergency Department (HOSPITAL_COMMUNITY)
Admission: EM | Admit: 2011-12-04 | Discharge: 2011-12-04 | Disposition: A | Discharge status: Home / Self Care | Payer: Medicare HMO

## 2011-12-04 ENCOUNTER — Emergency Department (HOSPITAL_COMMUNITY): Payer: Medicare HMO

## 2011-12-04 ENCOUNTER — Emergency Department (HOSPITAL_COMMUNITY)
Admission: EM | Admit: 2011-12-04 | Discharge: 2011-12-04 | Disposition: A | Discharge status: Home / Self Care | Payer: Medicare HMO | Attending: Emergency Medicine | Admitting: Emergency Medicine

## 2011-12-04 DIAGNOSIS — Z79899 Other long term (current) drug therapy: Secondary | ICD-10-CM | POA: Insufficient documentation

## 2011-12-04 DIAGNOSIS — Z794 Long term (current) use of insulin: Secondary | ICD-10-CM | POA: Insufficient documentation

## 2011-12-04 DIAGNOSIS — Z8546 Personal history of malignant neoplasm of prostate: Secondary | ICD-10-CM | POA: Insufficient documentation

## 2011-12-04 DIAGNOSIS — I10 Essential (primary) hypertension: Secondary | ICD-10-CM | POA: Insufficient documentation

## 2011-12-04 DIAGNOSIS — R339 Retention of urine, unspecified: Secondary | ICD-10-CM | POA: Insufficient documentation

## 2011-12-04 DIAGNOSIS — R51 Headache: Secondary | ICD-10-CM | POA: Insufficient documentation

## 2011-12-04 DIAGNOSIS — M25519 Pain in unspecified shoulder: Secondary | ICD-10-CM | POA: Insufficient documentation

## 2011-12-04 DIAGNOSIS — N390 Urinary tract infection, site not specified: Secondary | ICD-10-CM | POA: Insufficient documentation

## 2011-12-04 DIAGNOSIS — M542 Cervicalgia: Secondary | ICD-10-CM | POA: Insufficient documentation

## 2011-12-04 LAB — COMPREHENSIVE METABOLIC PANEL
AST: 12 U/L (ref 0–37)
CO2: 22 mEq/L (ref 19–32)
Chloride: 102 mEq/L (ref 96–112)
Creatinine, Ser: 1.16 mg/dL (ref 0.50–1.35)
GFR calc non Af Amer: 62 mL/min — ABNORMAL LOW (ref >90)
Glucose, Bld: 149 mg/dL — ABNORMAL HIGH (ref 70–99)
Total Bilirubin: 0.2 mg/dL — ABNORMAL LOW (ref 0.3–1.2)

## 2011-12-04 LAB — DIFFERENTIAL
Basophils Absolute: 0 10*3/uL (ref 0.0–0.1)
Lymphocytes Relative: 19 % (ref 12–46)
Lymphs Abs: 1.1 10*3/uL (ref 0.7–4.0)
Monocytes Absolute: 0.5 10*3/uL (ref 0.1–1.0)
Monocytes Relative: 8 % (ref 3–12)
Neutro Abs: 3.6 10*3/uL (ref 1.7–7.7)

## 2011-12-04 LAB — CBC
HCT: 32.2 % — ABNORMAL LOW (ref 39.0–52.0)
Hemoglobin: 10.9 g/dL — ABNORMAL LOW (ref 13.0–17.0)
RBC: 3.73 MIL/uL — ABNORMAL LOW (ref 4.22–5.81)
RDW: 15.4 % (ref 11.5–15.5)
WBC: 5.5 10*3/uL (ref 4.0–10.5)

## 2011-12-04 LAB — URINALYSIS, ROUTINE W REFLEX MICROSCOPIC
Bilirubin Urine: NEGATIVE
Nitrite: POSITIVE — AB
Specific Gravity, Urine: 1.015 (ref 1.005–1.030)
pH: 6 (ref 5.0–8.0)

## 2011-12-04 LAB — URINE MICROSCOPIC-ADD ON

## 2011-12-04 MED ORDER — CIPROFLOXACIN HCL 500 MG PO TABS: 500.0000 mg | ORAL_TABLET | Freq: Two times a day (BID) | ORAL | Status: AC

## 2011-12-04 NOTE — ED Notes (Signed)
Pt wanted to receive a narcotic for headache. Pt was just seen and discharged from ED. Upon starting triage pt states he does not want to go through the process again. Situation explained to patient that in order to receive narcotic he would have to go back to ED again. Pt refused and walked to lobby with steady gate.

## 2011-12-04 NOTE — ED Notes (Signed)
When assessing pain scale pt states he is not having any pain right now.

## 2011-12-04 NOTE — ED Notes (Addendum)
Pt presents with headache that radiates to shoulders and neck. Pt states pain started yesterday.

## 2011-12-04 NOTE — ED Provider Notes (Cosign Needed Addendum)
History     CSN: [id number]  Arrival date & time 12/04/11  1247   First MD Initiated Contact with Patient 12/04/11 1326      Chief Complaint  Patient presents with  . Headache  . Neck Pain  . Shoulder Pain    (Consider location/radiation/quality/duration/timing/severity/associated sxs/prior treatment) HPI Comments: Patient is a 71 year old man with a history of coronary artery disease and prostate cancer. He has had mild headaches in the past, but she would treat by rubbing his head for some type of lotion. Yesterday and today he has had a more severe headache, originating in the occipital region, going into his neck, going into the right temporal region. He has taken Tylenol for this with some relief. He is concerned about this headache and therefore sought evaluation. He is not currently experiencing pain from the headache. There is no history of head injury.  Patient is a 71 y.o. male presenting with headaches, neck pain, and shoulder pain. The history is provided by the patient and medical records. No language interpreter was used.  Headache  This is a new problem. The current episode started yesterday. Episode frequency: he has intermittent spells of headache, in the occipital region, with radiation into the neck and right temporal region. The problem has not changed since onset.The headache is associated with nothing. The pain is located in the occipital region. The pain is moderate. Radiates to: pain radiates to the neck, and also to the right temporal region. Pertinent negatives include no anorexia, no fever, no nausea and no vomiting. He has tried acetaminophen for the symptoms. The treatment provided moderate relief.  Neck Pain  Associated symptoms include headaches.  Shoulder Pain Associated symptoms include headaches.    Past Medical History  Diagnosis Date  . Hypertension   . Prostate ca   . Skin infection     history  . HTN (hypertension) 11/23/2011    Past Surgical  History  Procedure Date  . Cardiac catheterization 02/2009  . Transurethral resection of prostate 03/2008    History reviewed. No pertinent family history.  History  Substance Use Topics  . Smoking status: Never Smoker   . Smokeless tobacco: Not on file  . Alcohol Use: No      Review of Systems  Constitutional: Negative.  Negative for fever and chills.  HENT: Positive for neck pain.   Eyes: Negative.   Respiratory: Negative.   Cardiovascular: Negative.   Gastrointestinal: Negative.  Negative for nausea, vomiting and anorexia.  Genitourinary:       He has incontinence of urine, and have to wear a depends undergarment, since he had prostate surgery.  Skin: Negative.   Neurological: Positive for headaches.  Psychiatric/Behavioral: Negative.     Allergies  Review of patient's allergies indicates no known allergies.  Home Medications   Current Outpatient Rx  Name Route Sig Dispense Refill  . AMLODIPINE BESYLATE 10 MG PO TABS Oral Take 10 mg by mouth daily.      . ASPIRIN 81 MG PO TABS Oral Take 81 mg by mouth daily.      Marland Kitchen BICALUTAMIDE 50 MG PO TABS Oral Take 50 mg by mouth daily.      Marland Kitchen CARVEDILOL 25 MG PO TABS  TAKE 1 TABLET TWICE DAILY 180 tablet 3    Disregard previous refill of coreg 6.25  . HYDRALAZINE HCL 100 MG PO TABS Oral Take 1 tablet (100 mg total) by mouth 3 (three) times daily. 270 tablet 3  . ISOSORBIDE DINITRATE  20 MG PO TABS  20 mg 3 (three) times daily.     Marland Kitchen LANTUS SOLOSTAR 100 UNIT/ML [place] SOLN  25 Units daily.     Marland Kitchen METFORMIN HCL 500 MG PO TABS Oral Take 500 mg by mouth 2 (two) times daily with a meal.      . QUINAPRIL HCL 40 MG PO TABS Oral Take 40 mg by mouth daily.      Marland Kitchen TAMSULOSIN HCL 0.4 MG PO CAPS Oral Take 0.4 mg by mouth daily.      . TRIAMTERENE-HCTZ 37.5-25 MG PO CAPS Oral Take 1 capsule by mouth every morning.        BP 129/71  Pulse 87  Temp(Src) 97.7 F (36.5 C) (Oral)  Resp 20  Ht 5\' 7"  (1.702 m)  Wt 190 lb (86.183 kg)  BMI 29.76  kg/m2  SpO2 100%  Physical Exam  Constitutional: He is oriented to person, place, and time. He appears well-developed and well-nourished. No distress.       He has a worried expression on his face.  HENT:  Head: Normocephalic and atraumatic.  Right Ear: External ear normal.  Left Ear: External ear normal.  Mouth/Throat: Oropharynx is clear and moist.  Eyes: Conjunctivae and EOM are normal. Pupils are equal, round, and reactive to light.  Neck: Normal range of motion. Neck supple. No JVD present. No thyromegaly present.       No meningismus.  Cardiovascular: Normal rate, regular rhythm and normal heart sounds.   Pulmonary/Chest: Effort normal and breath sounds normal.  Abdominal: Soft. Bowel sounds are normal.  Genitourinary:       Patient is wearing a depends garmentbecause he has incontinence of urine.  Musculoskeletal: Normal range of motion. He exhibits no edema and no tenderness.  Neurological: He is alert and oriented to person, place, and time.       No sensory or motor deficit.  Skin: Skin is warm and dry.  Psychiatric: He has a normal mood and affect. His behavior is normal.    ED Course  Procedures (including critical care time)   Labs Reviewed  CBC  DIFFERENTIAL  COMPREHENSIVE METABOLIC PANEL  URINE CULTURE  URINALYSIS, ROUTINE W REFLEX MICROSCOPIC   1:28 PM Patient was seen and had physical examination. Old charts were reviewed. Laboratory testing and CT x-ray of the head and cervical spine was ordered.  2:31 PM Results for orders placed during the hospital encounter of 12/04/11  CBC      Component Value Range   WBC 5.5  4.0 - 10.5 (K/uL)   RBC 3.73 (*) 4.22 - 5.81 (MIL/uL)   Hemoglobin 10.9 (*) 13.0 - 17.0 (g/dL)   HCT 16.1 (*) 09.6 - 52.0 (%)   MCV 86.3  78.0 - 100.0 (fL)   MCH 29.2  26.0 - 34.0 (pg)   MCHC 33.9  30.0 - 36.0 (g/dL)   RDW 04.5  40.9 - 81.1 (%)   Platelets 328  150 - 400 (K/uL)  DIFFERENTIAL      Component Value Range   Neutrophils  Relative 65  43 - 77 (%)   Neutro Abs 3.6  1.7 - 7.7 (K/uL)   Lymphocytes Relative 19  12 - 46 (%)   Lymphs Abs 1.1  0.7 - 4.0 (K/uL)   Monocytes Relative 8  3 - 12 (%)   Monocytes Absolute 0.5  0.1 - 1.0 (K/uL)   Eosinophils Relative 7 (*) 0 - 5 (%)   Eosinophils Absolute 0.4  0.0 -  0.7 (K/uL)   Basophils Relative 1  0 - 1 (%)   Basophils Absolute 0.0  0.0 - 0.1 (K/uL)  COMPREHENSIVE METABOLIC PANEL      Component Value Range   Sodium 136  135 - 145 (mEq/L)   Potassium 3.7  3.5 - 5.1 (mEq/L)   Chloride 102  96 - 112 (mEq/L)   CO2 22  19 - 32 (mEq/L)   Glucose, Bld 149 (*) 70 - 99 (mg/dL)   BUN 24 (*) 6 - 23 (mg/dL)   Creatinine, Ser 2.13  0.50 - 1.35 (mg/dL)   Calcium 9.4  8.4 - 08.6 (mg/dL)   Total Protein 7.3  6.0 - 8.3 (g/dL)   Albumin 3.5  3.5 - 5.2 (g/dL)   AST 12  0 - 37 (U/L)   ALT 7  0 - 53 (U/L)   Alkaline Phosphatase 88  39 - 117 (U/L)   Total Bilirubin 0.2 (*) 0.3 - 1.2 (mg/dL)   GFR calc non Af Amer 62 (*) >90 (mL/min)   GFR calc Af Amer 72 (*) >90 (mL/min)   Ct Head Wo Contrast  12/04/2011  *RADIOLOGY REPORT*  Clinical Data:  Severe headache  CT HEAD WITHOUT CONTRAST CT CERVICAL SPINE WITHOUT CONTRAST  Technique:  Multidetector CT imaging of the head and cervical spine was performed following the standard protocol without intravenous contrast.  Multiplanar CT image reconstructions of the cervical spine were also generated.  Comparison:  03/07/2011  CT HEAD  Findings: There is diffuse patchy low density throughout the subcortical and periventricular white matter consistent with chronic small vessel ischemic change.  There is prominence of the sulci and ventricles consistent with brain atrophy.  There is no evidence for acute brain infarct, hemorrhage or mass.  The mastoid air cells and paranasal sinuses are clear.  The skull is intact.  IMPRESSION:  1.  No acute intracranial abnormalities.  CT CERVICAL SPINE  Findings: Normal alignment of the cervical spine.  The vertebral  body heights are preserved.  Mild disc space narrowing and ventral spurring is noted at the C4-5 and C5-6 level.  The facet joints appear normal.  No fractures or subluxations identified.  IMPRESSION:  1.  Mild cervical spondylosis. 2.  No acute findings.  Original Report Authenticated By: Rosealee Albee, M.D.   Ct Cervical Spine Wo Contrast  12/04/2011  *RADIOLOGY REPORT*  Clinical Data:  Severe headache  CT HEAD WITHOUT CONTRAST CT CERVICAL SPINE WITHOUT CONTRAST  Technique:  Multidetector CT imaging of the head and cervical spine was performed following the standard protocol without intravenous contrast.  Multiplanar CT image reconstructions of the cervical spine were also generated.  Comparison:  03/07/2011  CT HEAD  Findings: There is diffuse patchy low density throughout the subcortical and periventricular white matter consistent with chronic small vessel ischemic change.  There is prominence of the sulci and ventricles consistent with brain atrophy.  There is no evidence for acute brain infarct, hemorrhage or mass.  The mastoid air cells and paranasal sinuses are clear.  The skull is intact.  IMPRESSION:  1.  No acute intracranial abnormalities.  CT CERVICAL SPINE  Findings: Normal alignment of the cervical spine.  The vertebral body heights are preserved.  Mild disc space narrowing and ventral spurring is noted at the C4-5 and C5-6 level.  The facet joints appear normal.  No fractures or subluxations identified.  IMPRESSION:  1.  Mild cervical spondylosis. 2.  No acute findings.  Original Report Authenticated By: Simonne Martinet.  Bradly Chris, M.D.    Lab tests and CT of head and cervical spine showed no major abnormalities. Results reviewed with pt. He continues to be asymptomatic, will take plain Tylenol if his headache recurs.  1. Headache   2. Urinary tract infection     3:02 PM UA came back and shows a UTI.  Will Rx with Cipro 500 mg bid x 7 days.       Carleene Cooper III, MD 12/04/11  1438  Carleene Cooper III, MD 12/04/11 [phone]

## 2011-12-05 LAB — URINE CULTURE

## 2012-01-14 ENCOUNTER — Ambulatory Visit (HOSPITAL_COMMUNITY): Payer: Medicare HMO

## 2012-01-14 ENCOUNTER — Ambulatory Visit (HOSPITAL_COMMUNITY): Payer: Medicare HMO | Admitting: Oncology

## 2012-01-14 ENCOUNTER — Other Ambulatory Visit (HOSPITAL_COMMUNITY): Payer: Medicare HMO

## 2012-01-19 ENCOUNTER — Encounter (HOSPITAL_COMMUNITY): Payer: Medicare HMO | Attending: Oncology | Admitting: Oncology

## 2012-01-19 ENCOUNTER — Encounter (HOSPITAL_COMMUNITY): Payer: Self-pay | Admitting: Oncology

## 2012-01-19 ENCOUNTER — Encounter (HOSPITAL_COMMUNITY): Payer: Medicare HMO

## 2012-01-19 VITALS — BP 185/106 | HR 77 | Temp 97.9°F | Wt 206.3 lb

## 2012-01-19 DIAGNOSIS — C7919 Secondary malignant neoplasm of other urinary organs: Secondary | ICD-10-CM

## 2012-01-19 DIAGNOSIS — C61 Malignant neoplasm of prostate: Secondary | ICD-10-CM | POA: Insufficient documentation

## 2012-01-19 LAB — CBC
MCHC: 34 g/dL (ref 30.0–36.0)
RDW: 15.2 % (ref 11.5–15.5)

## 2012-01-19 LAB — DIFFERENTIAL
Basophils Absolute: 0 10*3/uL (ref 0.0–0.1)
Basophils Relative: 1 % (ref 0–1)
Neutro Abs: 4.2 10*3/uL (ref 1.7–7.7)
Neutrophils Relative %: 61 % (ref 43–77)

## 2012-01-19 NOTE — Progress Notes (Signed)
Oscar Stalling, MD, MD [address] [place] [address]  1. ADENOCARCINOMA, PROSTATE     CURRENT THERAPY:Depo-Lupon 22.5 mg every 12 weeks and Casodex daily. S/P biopsy and fulguration March 2011. S/P TURP followed by radiation in 2009.   INTERVAL HISTORY: Oscar Hickman 71 y.o. 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 Indwelling Foley catheter removed. Now on Depo-Lupro and Casodex. 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  On our last visit, it was noted and discussed with the patient that his PSA is doubling at approximately 6 months time.  This is worrisome bio chemically.  It was ordered and scheduled to perform a CT abd/pelvis with contrast and bone scan to further evaluate this increasing PSA.  Unfortunately, the patient failed to report for these tests.  After discussing this with the patient and his wife, they were unaware of these ordered tests.  They are willing to have these scans performed.  Thus, I will re-order the tests and have them scheduled before the appointment ends today.  The patient and his wife are unsure if they have any Casodex at home.  They will check and call the clinic to let our nursing staff know.  If they need, some, we will prescribe it to him and send it to [facility] for them to deliver the medication to his house.  The patient also had lab work performed by his PCP recently.  We do not have a copy of this lab work.  Therefore, we will draw blood, contact the PCP office, ascertain copies of the lab work and only send the lab work for what is needed to prevent any duplicate tests.  ROS: No TIA's or unusual headaches, no dysphagia.  No prolonged cough. No dyspnea or chest pain on exertion.  No abdominal pain, change in bowel habits, black or  bloody stools.  No urinary tract symptoms.  No new or unusual musculoskeletal symptoms.     Past Medical History  Diagnosis Date  . Hypertension   . Prostate ca   . Skin infection     history  . HTN (hypertension) 11/23/2011  . High cholesterol     has ADENOCARCINOMA, PROSTATE; DIABETES MELLITUS, TYPE II; HYPERLIPIDEMIA; and HTN (hypertension) on his problem list.      has no known allergies.  Oscar Hickman does not currently have medications on file.  Past Surgical History  Procedure Date  . Cardiac catheterization 02/2009  . Transurethral resection of prostate 03/2008    Denies any headaches, dizziness, double vision, fevers, chills, night sweats, nausea, vomiting, diarrhea, constipation, chest pain, heart palpitations, shortness of breath, blood in stool, black tarry stool, urinary pain, urinary burning, urinary frequency, hematuria.   PHYSICAL EXAMINATION  ECOG PERFORMANCE STATUS: 1 - Symptomatic but completely ambulatory  Filed Vitals:   01/19/12 1200  BP: 185/106  Pulse: 77  Temp: 97.9 F (36.6 C)    GENERAL:alert, no distress, well nourished, well developed, comfortable, cooperative, smiling and multiple SKs on face and wearing a helmet. SKIN: skin color, texture, turgor are normal HEAD: Normocephalic, multiple SKs on face EYES: normal, PERRLA EARS: External ears normal OROPHARYNX:mucous membranes are moist  NECK: supple, trachea midline LYMPH:  no palpable lymphadenopathy, no hepatosplenomegaly BREAST:not examined LUNGS: clear to auscultation and percussion HEART: regular rate & rhythm, no murmurs, no gallops, S1 normal  and S2 normal ABDOMEN:abdomen soft, non-tender, normal bowel sounds, no masses or organomegaly and no hepatosplenomegaly BACK: Back symmetric, no curvature., No CVA tenderness EXTREMITIES:less then 2 second capillary refill, no joint deformities, effusion, or inflammation, no edema, no skin discoloration, no clubbing, no cyanosis  NEURO: alert  & oriented x 3 with fluent speech, no focal motor/sensory deficits, gait normal   LABORATORY DATA: CBC    Component Value Date/Time   WBC 5.5 12/04/2011 1334   RBC 3.73* 12/04/2011 1334   HGB 10.9* 12/04/2011 1334   HCT 32.2* 12/04/2011 1334   PLT 328 12/04/2011 1334   MCV 86.3 12/04/2011 1334   MCH 29.2 12/04/2011 1334   MCHC 33.9 12/04/2011 1334   RDW 15.4 12/04/2011 1334   LYMPHSABS 1.1 12/04/2011 1334   MONOABS 0.5 12/04/2011 1334   EOSABS 0.4 12/04/2011 1334   BASOSABS 0.0 12/04/2011 1334    Lab Results  Component Value Date   PSA 0.55 11/23/2011   PSA 0.12 10/05/2011   PSA 0.05* 09/07/2011      Chemistry      Component Value Date/Time   NA 136 12/04/2011 1334   K 3.7 12/04/2011 1334   CL 102 12/04/2011 1334   CO2 22 12/04/2011 1334   BUN 24* 12/04/2011 1334   CREATININE 1.16 12/04/2011 1334      Component Value Date/Time   CALCIUM 9.4 12/04/2011 1334   ALKPHOS 88 12/04/2011 1334   AST 12 12/04/2011 1334   ALT 7 12/04/2011 1334   BILITOT 0.2* 12/04/2011 1334        ASSESSMENT:  1. 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 Indwelling Foley catheter removed. Now on Depo-Lupro and Casodex. 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  2. 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 DM  4. HTN   PLAN:  1. Will draw blood for a CBC diff, and PSA.  The patient reports that he recently had lab work performed by his PCP.  We will contact the PCP and get a copy of this blood work.  We will run that aforementioned tests if these are not a repeat of what his PCP performed.  Will discard blood if not needed.  2. Patient failed to report for scheduled appointment for CT abd/pelvis with contrast and bone scan.  He and his wife had agreed to undergo this test if they have an appointment for the tests today upon their discharge. 3. CT abd/pelvis with contrast and  bone scan in 3 weeks. 4. Depo-Lupron in 4 weeks. 5. Patient's wife will call the clinic regarding the Casodex.  Will prescribe the medication if needed.  6. Will contact PCP office for copy of labs.  7. Return in 4 weeks the day of the Depo-lupron injection for follow-up and discuss results of radiographic studies.   All questions were answered. The patient knows to call the clinic with any problems, questions or concerns. We can certainly see the patient much sooner if necessary.  The patient and plan discussed with Glenford Peers, MD and he is in agreement with the aforementioned.   Azari Janssens

## 2012-01-19 NOTE — Patient Instructions (Signed)
[facility]  Discharge Instructions  RECOMMENDATIONS MADE BY THE CONSULTANT AND ANY TEST RESULTS WILL BE SENT TO YOUR REFERRING DOCTOR.  SPECIAL INSTRUCTIONS/FOLLOW-UP: Lab work Needed today, Xray Studies Needed: CT scan and bone scan,  within 3 weeks.  Return to Clinic in one month for Depo Lupron and to see [name] regarding the bone scan and CT scan.   I acknowledge that I have been informed and understand all the instructions given to me and received a copy. I do not have any more questions at this time, but understand that I may call the [facility]ty [facility] at [facility] at [phone] during business hours should I have any further questions or need assistance in obtaining follow-up care.    __________________________________________  _____________  __________ Signature of Patient or Authorized Representative            Date                   Time    __________________________________________ Nurse's Signature

## 2012-01-19 NOTE — Progress Notes (Signed)
See MD encounter 

## 2012-02-07 ENCOUNTER — Encounter (HOSPITAL_COMMUNITY): Payer: Self-pay | Admitting: *Deleted

## 2012-02-07 ENCOUNTER — Emergency Department (HOSPITAL_COMMUNITY)
Admission: EM | Admit: 2012-02-07 | Discharge: 2012-02-07 | Disposition: A | Discharge status: Home / Self Care | Payer: Medicare HMO | Attending: Emergency Medicine | Admitting: Emergency Medicine

## 2012-02-07 ENCOUNTER — Emergency Department (HOSPITAL_COMMUNITY): Payer: Medicare HMO

## 2012-02-07 DIAGNOSIS — R0602 Shortness of breath: Secondary | ICD-10-CM | POA: Insufficient documentation

## 2012-02-07 DIAGNOSIS — Z8546 Personal history of malignant neoplasm of prostate: Secondary | ICD-10-CM | POA: Insufficient documentation

## 2012-02-07 DIAGNOSIS — E789 Disorder of lipoprotein metabolism, unspecified: Secondary | ICD-10-CM | POA: Insufficient documentation

## 2012-02-07 DIAGNOSIS — I10 Essential (primary) hypertension: Secondary | ICD-10-CM

## 2012-02-07 DIAGNOSIS — R062 Wheezing: Secondary | ICD-10-CM | POA: Insufficient documentation

## 2012-02-07 DIAGNOSIS — R0600 Dyspnea, unspecified: Secondary | ICD-10-CM

## 2012-02-07 DIAGNOSIS — Z79899 Other long term (current) drug therapy: Secondary | ICD-10-CM | POA: Insufficient documentation

## 2012-02-07 DIAGNOSIS — R51 Headache: Secondary | ICD-10-CM | POA: Insufficient documentation

## 2012-02-07 DIAGNOSIS — R0989 Other specified symptoms and signs involving the circulatory and respiratory systems: Secondary | ICD-10-CM | POA: Insufficient documentation

## 2012-02-07 DIAGNOSIS — R0609 Other forms of dyspnea: Secondary | ICD-10-CM | POA: Insufficient documentation

## 2012-02-07 MED ORDER — ACETAMINOPHEN 500 MG PO TABS
1000.0000 mg | ORAL_TABLET | Freq: Once | ORAL | Status: AC
  Administered 2012-02-07: 1000 mg via ORAL
  Filled 2012-02-07: qty 2

## 2012-02-07 NOTE — ED Notes (Signed)
Dr notified of pts BP

## 2012-02-07 NOTE — ED Notes (Signed)
Pt states came because of headache, however states head feels better and "might be SOB." pt denies chest pain, cold like symptoms.

## 2012-02-07 NOTE — ED Notes (Signed)
Pt DC to home with steady gait 

## 2012-02-07 NOTE — Discharge Instructions (Signed)
Follow up with your md for bp check in one week.

## 2012-02-07 NOTE — ED Provider Notes (Signed)
History  This chart was scribed for Oscar Lennert, MD by Oscar Hickman. This patient was seen in room APA14/APA14 and the patient's care was started at 4:03PM.  CSN: [id number]  Arrival date & time 02/07/12  1257   First MD Initiated Contact with Patient 02/07/12 1600      Chief Complaint  Patient presents with  . Shortness of Breath  . Headache    Patient is a 71 y.o. male presenting with shortness of breath. The history is provided by the patient. No language interpreter was used.  Shortness of Breath  The current episode started yesterday. The onset was gradual. The problem occurs continuously. The problem has been gradually improving. The problem is mild. The symptoms are relieved by rest. The symptoms are aggravated by activity. Associated symptoms include shortness of breath and wheezing. Pertinent negatives include no chest pain, no fever, no rhinorrhea, no sore throat and no cough. There was no intake of a foreign body. He has not inhaled smoke recently. His past medical history does not include asthma. There were no sick contacts.    Oscar Hickman is a 71 y.o. male who presents to the Emergency Department complaining of 24 hours of gradual onset, gradually improving, constant SOB with associated mild wheezing. Pt states that he began to feel SOB while walking around yesterday. He states that the symptoms were worse this morning. He now states that his breathing is better upon arrival to the ED. He denies having a h/o asthma or other respiratory illnesses. Pt also c/o HA but states that this has now resolved too. He has no current complaint of pain or illnesses at this time. Pt denies smoking, Pt denies fever, chills, ab pain, chest pain as associated symptoms. He has a h/o HTN but reports that he took bp medications today. He denies smoking and alcohol use.   Past Medical History  Diagnosis Date      . Prostate ca   . Skin infection     history  . HTN (hypertension)  11/23/2011  . High cholesterol     Past Surgical History  Procedure Date  . Cardiac catheterization 02/2009  . Transurethral resection of prostate 03/2008    Family History  Problem Relation Age of Onset  . Cancer Mother   . Cancer Father     History  Substance Use Topics  . Smoking status: Never Smoker   . Smokeless tobacco: Never Used  . Alcohol Use: No      Review of Systems  Constitutional: Negative for fever and fatigue.  HENT: Negative for congestion, sore throat, rhinorrhea, sinus pressure and ear discharge.   Eyes: Negative for discharge.  Respiratory: Positive for shortness of breath and wheezing. Negative for cough.   Cardiovascular: Negative for chest pain.  Gastrointestinal: Negative for abdominal pain and diarrhea.  Genitourinary: Negative for frequency and hematuria.  Musculoskeletal: Negative for back pain.  Skin: Negative for rash.  Neurological: Negative for seizures.  Hematological: Negative.   Psychiatric/Behavioral: Negative for hallucinations.    Allergies  Review of patient's allergies indicates no known allergies.  Home Medications   Current Outpatient Rx  Name Route Sig Dispense Refill  . ACETAMINOPHEN 500 MG PO TABS Oral Take 500 mg by mouth every 6 (six) hours as needed.    Marland Kitchen AMLODIPINE BESYLATE 10 MG PO TABS Oral Take 10 mg by mouth daily.      . ASPIRIN 81 MG PO TABS Oral Take 81 mg by mouth daily.      Marland Kitchen  BICALUTAMIDE 50 MG PO TABS Oral Take 50 mg by mouth daily.      Marland Kitchen CARVEDILOL 25 MG PO TABS Oral Take 25 mg by mouth 2 (two) times daily with a meal.    . HYDRALAZINE HCL 100 MG PO TABS Oral Take 1 tablet (100 mg total) by mouth 3 (three) times daily. 270 tablet 3  . ISOSORBIDE DINITRATE 20 MG PO TABS Oral Take 20 mg by mouth 3 (three) times daily.     Marland Kitchen METFORMIN HCL 500 MG PO TABS Oral Take 500 mg by mouth 2 (two) times daily with a meal.      . PRAVASTATIN SODIUM 40 MG PO TABS Oral Take 40 mg by mouth daily.    . QUINAPRIL HCL 40 MG  PO TABS Oral Take 40 mg by mouth daily.      . TRIAMTERENE-HCTZ 37.5-25 MG PO TABS Oral Take 1 tablet by mouth daily.      Triage Vitals: BP 189/116  Pulse 80  Temp(Src) 98 F (36.7 C) (Oral)  Resp 24  Ht 5\' 9"  (1.753 m)  Wt 206 lb (93.441 kg)  BMI 30.42 kg/m2  SpO2 99%  Physical Exam  Nursing note and vitals reviewed. Constitutional: He is oriented to person, place, and time. He appears well-developed.  HENT:  Head: Normocephalic and atraumatic.  Eyes: Conjunctivae and EOM are normal. No scleral icterus.  Neck: Neck supple. No thyromegaly present.  Cardiovascular: Normal rate and regular rhythm.  Exam reveals no gallop and no friction rub.   No murmur heard. Pulmonary/Chest: No stridor. He has no wheezes. He has no rales. He exhibits no tenderness.  Abdominal: He exhibits no distension. There is no tenderness. There is no rebound.  Musculoskeletal: Normal range of motion. He exhibits no edema.  Lymphadenopathy:    He has no cervical adenopathy.  Neurological: He is oriented to person, place, and time. Coordination normal.  Skin: No rash noted. No erythema.  Psychiatric: He has a normal mood and affect. His behavior is normal.    ED Course  Procedures (including critical care time)  DIAGNOSTIC STUDIES: Oxygen Saturation is 99% on room air, normal by my interpretation.    COORDINATION OF CARE: 4:05PM-Discussed chest x-ray and treatment plan with pt and pt agreed to plan. 6:03PM-Advised pt that his bp was high and to follow up within the next week with PCP. Pt c/o a mild HA again. Will give pt tylenol and discharge home.  Labs Reviewed - No data to display  Dg Chest 2 View  02/07/2012  *RADIOLOGY REPORT*  Clinical Data: 71 year old male with shortness of breath and headache.  CHEST - 2 VIEW  Comparison: 03/07/2011 and earlier.  Findings: Chronic central pulmonary artery enlargement. Stable cardiomegaly and mediastinal contours.  No pneumothorax, pulmonary edema, pleural  effusion or acute pulmonary opacity.  The Stable visualized osseous structures.  IMPRESSION: Chronic cardiomegaly and suspected pulmonary artery hypertension. No superimposed acute findings are identified.  Original Report Authenticated By: Harley Hallmark, M.D.     No diagnosis found.  The pt states he had sob earlier but none when I examined him or while he was in the er.  MDM  Htn  Poorly controled.  Will follow up with his md     The chart was scribed for me under my direct supervision.  I personally performed the history, physical, and medical decision making and all procedures in the evaluation of this patient.Oscar Lennert, MD 02/07/12 [phone]

## 2012-02-14 ENCOUNTER — Ambulatory Visit (HOSPITAL_COMMUNITY): Payer: Medicare HMO

## 2012-02-25 ENCOUNTER — Inpatient Hospital Stay (HOSPITAL_COMMUNITY)
Admission: EM | Admit: 2012-02-25 | Discharge: 2012-03-02 | DRG: 305 | Disposition: A | Discharge status: Home Health | Payer: Medicare Other | Attending: Family Medicine | Admitting: Family Medicine

## 2012-02-25 ENCOUNTER — Emergency Department (HOSPITAL_COMMUNITY): Payer: Medicare Other

## 2012-02-25 ENCOUNTER — Encounter (HOSPITAL_COMMUNITY): Payer: Self-pay | Admitting: *Deleted

## 2012-02-25 DIAGNOSIS — R0989 Other specified symptoms and signs involving the circulatory and respiratory systems: Secondary | ICD-10-CM | POA: Diagnosis present

## 2012-02-25 DIAGNOSIS — Z9119 Patient's noncompliance with other medical treatment and regimen: Secondary | ICD-10-CM

## 2012-02-25 DIAGNOSIS — Z8546 Personal history of malignant neoplasm of prostate: Secondary | ICD-10-CM

## 2012-02-25 DIAGNOSIS — I5020 Unspecified systolic (congestive) heart failure: Secondary | ICD-10-CM | POA: Diagnosis present

## 2012-02-25 DIAGNOSIS — E876 Hypokalemia: Secondary | ICD-10-CM | POA: Diagnosis not present

## 2012-02-25 DIAGNOSIS — R0609 Other forms of dyspnea: Secondary | ICD-10-CM | POA: Diagnosis present

## 2012-02-25 DIAGNOSIS — E118 Type 2 diabetes mellitus with unspecified complications: Secondary | ICD-10-CM | POA: Insufficient documentation

## 2012-02-25 DIAGNOSIS — I428 Other cardiomyopathies: Secondary | ICD-10-CM | POA: Diagnosis present

## 2012-02-25 DIAGNOSIS — I10 Essential (primary) hypertension: Secondary | ICD-10-CM

## 2012-02-25 DIAGNOSIS — I2510 Atherosclerotic heart disease of native coronary artery without angina pectoris: Secondary | ICD-10-CM | POA: Diagnosis present

## 2012-02-25 DIAGNOSIS — E785 Hyperlipidemia, unspecified: Secondary | ICD-10-CM | POA: Diagnosis present

## 2012-02-25 DIAGNOSIS — Z91199 Patient's noncompliance with other medical treatment and regimen due to unspecified reason: Secondary | ICD-10-CM

## 2012-02-25 DIAGNOSIS — I161 Hypertensive emergency: Secondary | ICD-10-CM

## 2012-02-25 DIAGNOSIS — I509 Heart failure, unspecified: Secondary | ICD-10-CM

## 2012-02-25 DIAGNOSIS — E119 Type 2 diabetes mellitus without complications: Secondary | ICD-10-CM | POA: Diagnosis present

## 2012-02-25 DIAGNOSIS — R799 Abnormal finding of blood chemistry, unspecified: Secondary | ICD-10-CM | POA: Diagnosis present

## 2012-02-25 LAB — CBC
MCH: 28.5 pg (ref 26.0–34.0)
Platelets: 327 10*3/uL (ref 150–400)
RBC: 3.72 MIL/uL — ABNORMAL LOW (ref 4.22–5.81)
WBC: 5.8 10*3/uL (ref 4.0–10.5)

## 2012-02-25 LAB — BASIC METABOLIC PANEL
GFR calc non Af Amer: 88 mL/min — ABNORMAL LOW (ref >90)
Glucose, Bld: 176 mg/dL — ABNORMAL HIGH (ref 70–99)
Potassium: 3.7 mEq/L (ref 3.5–5.1)
Sodium: 139 mEq/L (ref 135–145)

## 2012-02-25 LAB — MRSA PCR SCREENING: MRSA by PCR: NEGATIVE

## 2012-02-25 LAB — URINALYSIS, ROUTINE W REFLEX MICROSCOPIC
Glucose, UA: NEGATIVE mg/dL
Nitrite: NEGATIVE
Protein, ur: 100 mg/dL — AB
Urobilinogen, UA: 0.2 mg/dL (ref 0.0–1.0)

## 2012-02-25 LAB — PRO B NATRIURETIC PEPTIDE: Pro B Natriuretic peptide (BNP): 5253 pg/mL — ABNORMAL HIGH (ref 0–125)

## 2012-02-25 LAB — DIFFERENTIAL
Basophils Absolute: 0 10*3/uL (ref 0.0–0.1)
Eosinophils Absolute: 0.2 10*3/uL (ref 0.0–0.7)
Lymphocytes Relative: 14 % (ref 12–46)
Lymphs Abs: 0.8 10*3/uL (ref 0.7–4.0)
Neutrophils Relative %: 72 % (ref 43–77)

## 2012-02-25 LAB — HEPATIC FUNCTION PANEL
ALT: 41 U/L (ref 0–53)
Total Protein: 6.4 g/dL (ref 6.0–8.3)

## 2012-02-25 LAB — URINE MICROSCOPIC-ADD ON

## 2012-02-25 LAB — HEMOGLOBIN A1C: Hgb A1c MFr Bld: 5.9 % — ABNORMAL HIGH (ref <5.7)

## 2012-02-25 LAB — CARDIAC PANEL(CRET KIN+CKTOT+MB+TROPI)
CK, MB: 9.9 ng/mL (ref 0.3–4.0)
CK, MB: 6.5 ng/mL (ref 0.3–4.0)
Total CK: 208 U/L (ref 7–232)
Troponin I: 2.51 ng/mL (ref <0.30)

## 2012-02-25 MED ORDER — TAMSULOSIN HCL 0.4 MG PO CAPS
0.4000 mg | ORAL_CAPSULE | Freq: Every day | ORAL | Status: DC
  Administered 2012-02-25 – 2012-03-01 (×6): 0.4 mg via ORAL
  Filled 2012-02-25 (×7): qty 1

## 2012-02-25 MED ORDER — SODIUM CHLORIDE 0.9 % IV SOLN
INTRAVENOUS | Status: DC
  Administered 2012-02-25 – 2012-02-26 (×2): via INTRAVENOUS

## 2012-02-25 MED ORDER — FUROSEMIDE 10 MG/ML IJ SOLN
40.0000 mg | Freq: Once | INTRAMUSCULAR | Status: DC
  Filled 2012-02-25: qty 4

## 2012-02-25 MED ORDER — NITROGLYCERIN 2 % TD OINT
1.0000 [in_us] | TOPICAL_OINTMENT | Freq: Once | TRANSDERMAL | Status: AC
  Administered 2012-02-25: 1 [in_us] via TOPICAL
  Filled 2012-02-25: qty 1

## 2012-02-25 MED ORDER — LABETALOL HCL 5 MG/ML IV SOLN
20.0000 mg | INTRAVENOUS | Status: DC | PRN
  Filled 2012-02-25 (×2): qty 4

## 2012-02-25 MED ORDER — IPRATROPIUM-ALBUTEROL 0.5-2.5 (3) MG/3ML IN SOLN: 3.0000 mL | RESPIRATORY_TRACT | Status: DC

## 2012-02-25 MED ORDER — ENOXAPARIN SODIUM 40 MG/0.4ML ~~LOC~~ SOLN
40.0000 mg | SUBCUTANEOUS | Status: DC
  Administered 2012-02-25: 40 mg via SUBCUTANEOUS
  Filled 2012-02-25: qty 0.4

## 2012-02-25 MED ORDER — BENAZEPRIL HCL 10 MG PO TABS
20.0000 mg | ORAL_TABLET | Freq: Every day | ORAL | Status: DC
  Administered 2012-02-25 – 2012-03-01 (×6): 20 mg via ORAL
  Filled 2012-02-25 (×7): qty 2

## 2012-02-25 MED ORDER — ALBUTEROL SULFATE (5 MG/ML) 0.5% IN NEBU
2.5000 mg | INHALATION_SOLUTION | RESPIRATORY_TRACT | Status: DC
  Administered 2012-02-25 (×3): 2.5 mg via RESPIRATORY_TRACT
  Filled 2012-02-25 (×3): qty 0.5

## 2012-02-25 MED ORDER — ASPIRIN 81 MG PO CHEW
324.0000 mg | CHEWABLE_TABLET | Freq: Once | ORAL | Status: AC
  Administered 2012-02-25: 243 mg via ORAL
  Filled 2012-02-25: qty 4

## 2012-02-25 MED ORDER — AMLODIPINE BESYLATE 5 MG PO TABS
10.0000 mg | ORAL_TABLET | Freq: Every day | ORAL | Status: DC
  Administered 2012-02-25 – 2012-02-28 (×4): 10 mg via ORAL
  Filled 2012-02-25 (×4): qty 2

## 2012-02-25 MED ORDER — HYDRALAZINE HCL 25 MG PO TABS
50.0000 mg | ORAL_TABLET | Freq: Three times a day (TID) | ORAL | Status: DC
  Administered 2012-02-25 – 2012-03-02 (×18): 50 mg via ORAL
  Filled 2012-02-25 (×4): qty 2
  Filled 2012-02-25: qty 4
  Filled 2012-02-25 (×10): qty 2
  Filled 2012-02-25: qty 4

## 2012-02-25 MED ORDER — FUROSEMIDE 20 MG PO TABS
20.0000 mg | ORAL_TABLET | Freq: Two times a day (BID) | ORAL | Status: DC
  Administered 2012-02-25 – 2012-02-28 (×6): 20 mg via ORAL
  Filled 2012-02-25 (×6): qty 1

## 2012-02-25 MED ORDER — IPRATROPIUM BROMIDE 0.02 % IN SOLN
0.5000 mg | RESPIRATORY_TRACT | Status: DC
  Administered 2012-02-25 (×3): 0.5 mg via RESPIRATORY_TRACT
  Filled 2012-02-25 (×3): qty 2.5

## 2012-02-25 MED ORDER — ASPIRIN 81 MG PO CHEW
81.0000 mg | CHEWABLE_TABLET | Freq: Every day | ORAL | Status: DC
  Administered 2012-02-26 – 2012-03-01 (×5): 81 mg via ORAL
  Filled 2012-02-25 (×6): qty 1

## 2012-02-25 MED ORDER — SIMVASTATIN 20 MG PO TABS
20.0000 mg | ORAL_TABLET | Freq: Every day | ORAL | Status: DC
  Administered 2012-02-25 – 2012-03-01 (×6): 20 mg via ORAL
  Filled 2012-02-25 (×6): qty 1

## 2012-02-25 MED ORDER — CARVEDILOL 12.5 MG PO TABS
12.5000 mg | ORAL_TABLET | Freq: Two times a day (BID) | ORAL | Status: DC
  Administered 2012-02-25 – 2012-03-02 (×12): 12.5 mg via ORAL
  Filled 2012-02-25 (×12): qty 1

## 2012-02-25 MED ORDER — ISOSORBIDE MONONITRATE ER 60 MG PO TB24
30.0000 mg | ORAL_TABLET | Freq: Every day | ORAL | Status: DC
  Administered 2012-02-25 – 2012-03-01 (×6): 30 mg via ORAL
  Filled 2012-02-25 (×7): qty 1

## 2012-02-25 MED ORDER — NITROGLYCERIN 0.4 MG SL SUBL
0.4000 mg | SUBLINGUAL_TABLET | SUBLINGUAL | Status: DC | PRN
  Filled 2012-02-25: qty 25

## 2012-02-25 MED ORDER — CLONIDINE HCL 0.1 MG PO TABS
0.1000 mg | ORAL_TABLET | Freq: Three times a day (TID) | ORAL | Status: DC
  Administered 2012-02-25 – 2012-02-27 (×8): 0.1 mg via ORAL
  Filled 2012-02-25 (×8): qty 1

## 2012-02-25 MED ORDER — METFORMIN HCL 500 MG PO TABS
500.0000 mg | ORAL_TABLET | Freq: Two times a day (BID) | ORAL | Status: DC
  Administered 2012-02-25 – 2012-03-01 (×11): 500 mg via ORAL
  Filled 2012-02-25 (×13): qty 1

## 2012-02-25 MED ORDER — FUROSEMIDE 10 MG/ML IJ SOLN
40.0000 mg | Freq: Once | INTRAMUSCULAR | Status: AC
  Administered 2012-02-25: 40 mg via INTRAVENOUS

## 2012-02-25 NOTE — ED Provider Notes (Signed)
History  This chart was scribed for Joya Gaskins, MD by Cherlynn Perches. The patient was seen in room APA19/APA19. Patient's care was started at 0924.  CSN: [id number]  Arrival date & time 02/25/12  8295   First MD Initiated Contact with Patient 02/25/12 [phone]      Chief Complaint  Patient presents with  . Shortness of Breath  . Leg Swelling  . Hematuria     HPI  Oscar Hickman is a 71 y.o. male who presents to the Emergency Department complaining of 3 days of sudden onset, gradually worsening, constant shortness of breath with associated coughing and swelling of the lower legs. Pt reports that symptoms are made worse by exertion and lying flat. Pt states that he has been very busy recently because his wife is in the hospital with pneumonia and he is taking care of his grandchildren. Pt also is complaining of hematuria. Pt has prostate cancer and is currently being treated with medication. Pt denies chest congestion, abdominal pain, fever, chest pain, and HA. Pt has a h/o HTN, high cholesterol, diabetes, and heart disease. Pt denies smoking and alcohol use.  His course is worsening Rest improves his symptoms   Past Medical History  Diagnosis Date  . Hypertension   . Prostate ca   . Skin infection     history  . HTN (hypertension) 11/23/2011  . High cholesterol   . Diabetes mellitus   . Heart disease     pt states that he has been told that he has a "bad " heart    Past Surgical History  Procedure Date  . Cardiac catheterization 02/2009  . Transurethral resection of prostate 03/2008    Family History  Problem Relation Age of Onset  . Cancer Mother   . Cancer Father     History  Substance Use Topics  . Smoking status: Never Smoker   . Smokeless tobacco: Never Used  . Alcohol Use: No      Review of Systems 10 Systems reviewed and all are negative for acute change except as noted in the HPI.  Allergies  Review of patient's allergies indicates no known  allergies.  Home Medications   Current Outpatient Rx  Name Route Sig Dispense Refill  . ACETAMINOPHEN 500 MG PO TABS Oral Take 1,000 mg by mouth every 6 (six) hours as needed. For headache pain    . ARTIFICIAL TEAR OP Ophthalmic Apply 1 drop to eye daily as needed. Dry Eyes    . ASPIRIN EC 81 MG PO TBEC Oral Take 81 mg by mouth daily.    Marland Kitchen BICALUTAMIDE 50 MG PO TABS Oral Take 50 mg by mouth daily.      Marland Kitchen CARVEDILOL 25 MG PO TABS Oral Take 25 mg by mouth 2 (two) times daily with a meal.    . HYDRALAZINE HCL 50 MG PO TABS Oral Take 50 mg by mouth 3 (three) times daily.    . ISOSORBIDE DINITRATE 20 MG PO TABS Oral Take 20 mg by mouth 3 (three) times daily.     Marland Kitchen METFORMIN HCL 500 MG PO TABS Oral Take 500 mg by mouth 2 (two) times daily with a meal.      . PRAVASTATIN SODIUM 40 MG PO TABS Oral Take 1 tablet by mouth At bedtime.    . QUINAPRIL HCL 40 MG PO TABS Oral Take 40 mg by mouth daily.        BP 165/106  Pulse 88  Temp(Src) 98.3 F (  36.8 C) (Oral)  Resp 22  Ht 5\' 7"  (1.702 m)  Wt 206 lb (93.441 kg)  BMI 32.26 kg/m2  SpO2 96%  Physical Exam CONSTITUTIONAL: Well developed/well nourished HEAD AND FACE: Normocephalic/atraumatic EYES: EOMI/PERRL ENMT: Mucous membranes moist NECK: supple no meningeal signs SPINE:entire spine nontender CV: S1/S2 noted, no murmurs/rubs/gallops noted LUNGS: tachypnea, decreased breathing sounds bilaterally, no apparent distress ABDOMEN: soft, nontender, no rebound or guarding GU:no cva tenderness NEURO: Pt is awake/alert, moves all extremitiesx4 EXTREMITIES: pulses normal, full ROM, 1+ pedal edema bilaterally SKIN: warm, color normal PSYCH: no abnormalities of mood noted   ED Course  Procedures   CRITICAL CARE Performed by: Joya Gaskins   Total critical care time: 35  Critical care time was exclusive of separately billable procedures and treating other patients.  Critical care was necessary to treat or prevent imminent or  life-threatening deterioration.  Critical care was time spent personally by me on the following activities: development of treatment plan with patient and/or surrogate as well as nursing, discussions with consultants, evaluation of patient's response to treatment, examination of patient, obtaining history from patient or surrogate, ordering and performing treatments and interventions, ordering and review of laboratory studies, ordering and review of radiographic studies, pulse oximetry and re-evaluation of patient's condition.  DIAGNOSTIC STUDIES: Oxygen Saturation is 97% on room air, normal by my interpretation.    COORDINATION OF CARE: 9:54AM - discussed possible admission to hospital. Pt agrees with treatment plan. Pt noted to have +troponin, no EKG changes.  H/o nonischemic cardiomyopathy (last EF at 25%) but I can't find any recent cardiac cath.  Cardiology consult called.  ASA Given.  Also given NTG as well. 10:55AM - discussed admission to hospital and possible transfer to [facility] 11:03AM-Consult complete with [facility]. Patient case explained and discussed. Cardiology recommends patient be admitted at [facility] and avoid transfer to [facility].  I spoke to dr Dietrich Pates and we discussed his clinical exam/history, and she feels comfortable with patient at [facility], she recommends diuresis  D/w dr Janna Arch, will admit to tele at [facility] Reviewed  CBC - Abnormal; Notable for the following:    RBC 3.72 (*)    Hemoglobin 10.6 (*)    HCT 32.3 (*)    All other components within normal limits  POCT I-STAT TROPONIN I - Abnormal; Notable for the following:    Troponin i, poc 0.91 (*)    All other components within normal limits  DIFFERENTIAL  BASIC METABOLIC PANEL  PRO B NATRIURETIC PEPTIDE  URINALYSIS, ROUTINE W REFLEX MICROSCOPIC   Dg Chest Port 1 View  02/25/2012  *RADIOLOGY REPORT*  Clinical Data: Shortness of breath  PORTABLE CHEST - 1 VIEW  Comparison:  02/07/12  Findings: Cardiomegaly again noted.  Bilateral prominent pulmonary artery probable due to chronic pulmonary hypertension again noted. No acute infiltrate or pleural effusion.  No pulmonary edema. Thoracic spine osteopenia again noted.  IMPRESSION: No active disease.  Cardiomegaly again noted.  Again noted prominent central pulmonary artery probable due to chronic pulmonary hypertension .  Original Report Authenticated By: Natasha Mead, M.D.       MDM  Nursing notes reviewed and considered in documentation All labs/vitals reviewed and considered xrays reviewed and considered Previous records reviewed and considered     Date: 02/25/2012  Rate: 94  Rhythm: normal sinus rhythm  QRS Axis: left  Intervals: normal  ST/T Wave abnormalities: nonspecific ST changes  Conduction Disutrbances:none     I personally performed  the services described in this documentation, which was scribed in my presence. The recorded information has been reviewed and considered.      Joya Gaskins, MD 02/25/12 1116

## 2012-02-25 NOTE — Progress Notes (Signed)
Called dr with tropoin and CKMB. Order to move patient to  ICU

## 2012-02-25 NOTE — ED Notes (Signed)
Pt presents to er with sob that started 3-4 days ago, sob is worse with exertion, denies any pain, does admit to cough with white sputum production, pt also c/o swelling to bilateral lower extremities, unsure of when the swelling started, pt thinks that it is recent, pt states that he has not paid attention to the swelling due to having to help take care of his wife who is pt in hospital and taking care of his grandchildren. Pt also c/o blood in urine unsure of when it started.

## 2012-02-25 NOTE — Progress Notes (Signed)
Critical called to Dr Felecia Shelling of Troponin of 3.66, no new orders received at this time.

## 2012-02-25 NOTE — H&P (Signed)
NAME:  Oscar Hickman, Oscar Hickman NO.:  [id number]  MEDICAL RECORD NO.:  [id number]  LOCATION:  IC05                          FACILITY:  [facility]  PHYSICIAN:  Melvyn Novas, MDDATE OF BIRTH:  [birth year]  DATE OF ADMISSION:  02/25/2012 DATE OF DISCHARGE:  LH                             HISTORY & PHYSICAL   The patient is a 71 year old black male who has chronic noncompliance whose wife was recently admitted to the hospital 4 days ago with pneumonia who complains of increasing dyspnea on exertion.  He has a nonischemic cardiomyopathy.  Ejection fraction calculated at 35% with nonobstructive coronary disease by cath in 2010.  The patient does not smoke.  Does not drink.  Denies anginal chest pain.  Denies orthopnea or PND.  Just has worsening dyspnea on exertion.  Presents to the ER.  His troponin was minimally elevated.  His blood pressure has accelerated hypertension 180/115.  I strongly suspect noncompliance for several days.  The patient will be admitted for blood pressure control, observation of symptoms, and routine cardiac enzymes, monitoring of renal function.  PAST MEDICAL HISTORY:  Significant for hypertension, type 2 diabetes, hyperlipidemia, prostatic carcinoma, chronic noncompliance, hyperlipidemia.  PAST SURGICAL HISTORY:  Remarkable for status post TURP for bladder outlet obstruction 1 year ago.  He has no other significant surgical history.  He has no known allergies.  CURRENT MEDICINES: 1. Accupril 40 mg p.o. daily. 2. Hydralazine 50 p.o. t.i.d. 3. Isosorbide dinitrate 20 mg b.i.d. 4. Pravachol 40 mg p.o. daily. 5. Metformin 500 mg p.o. b.i.d. 6. Carvedilol 25 mg p.o. b.i.d. 7. Aspirin 81 mg per day.  REVIEW OF SYSTEMS:  Negative for seizures, tremors, polyuria, polydipsia, nausea, vomiting, melena, hematochezia, dizziness, palpitations, or syncope.  PHYSICAL EXAMINATION:  VITAL SIGNS:  Blood pressure 180/115, pulse is 98 and regular.  He  is afebrile.  Respiratory rate is 16. EYES:  PERRLA intact.  Extraocular movements intact.  Sclerae clear. Conjunctivae pink. NECK:  No JVD.  No carotid bruits.  No thyromegaly.  No thyroid bruits. LUNGS:  Diminished breath sounds at the bases.  Prolonged expiratory phase.  No rales, wheeze, or rhonchi appreciable. HEART:  Regular rhythm.  No murmurs, gallops, heaves, thrills, or rubs. ABDOMEN:  Soft, nontender.  Bowel sounds normoactive.  No guarding, rebound, mass, or megaly. EXTREMITIES:  Trace to 1+ pedal edema. NEUROLOGIC:  Cranial nerves II through XII grossly intact.  The patient moves all 4 extremities.  Plantars are downgoing.  IMPRESSION: 1. Increasing dyspnea on exertion. 2. Accelerated hypertension.  Blood pressure 180/115. 3. Chronic noncompliance. 4. Nonischemic cardiomyopathy with ejection fraction 35%. 5. Type 2 diabetes. 6. Hyperlipidemia. 7. Prostate carcinoma and status post transurethral resection of     prostate for bladder outlet obstruction.  The plan right now is to reinstitute carvedilol at a lower dose 12.5 p.o. b.i.d., benazepril 20 mg, hydralazine 50 p.o. t.i.d.,  Zocor 20, Imdur 30 mg p.o. daily, Norvasc 10, and we will make further recommendations as the database expands.     Melvyn Novas, MD     RMD/MEDQ  D:  02/25/2012  T:  02/25/2012  Job:  [id number]

## 2012-02-25 NOTE — Progress Notes (Signed)
No room in ICU. Dr. Eather Colas order to move once room is available

## 2012-02-25 NOTE — H&P (Signed)
[id number] 

## 2012-02-26 LAB — BASIC METABOLIC PANEL
Calcium: 8.7 mg/dL (ref 8.4–10.5)
GFR calc non Af Amer: 88 mL/min — ABNORMAL LOW (ref >90)
Glucose, Bld: 120 mg/dL — ABNORMAL HIGH (ref 70–99)
Sodium: 141 mEq/L (ref 135–145)

## 2012-02-26 LAB — CARDIAC PANEL(CRET KIN+CKTOT+MB+TROPI)
CK, MB: 5.3 ng/mL — ABNORMAL HIGH (ref 0.3–4.0)
CK, MB: 4.9 ng/mL — ABNORMAL HIGH (ref 0.3–4.0)
CK, MB: 4.3 ng/mL — ABNORMAL HIGH (ref 0.3–4.0)
Relative Index: 4 — ABNORMAL HIGH (ref 0.0–2.5)
Relative Index: 3.4 — ABNORMAL HIGH (ref 0.0–2.5)
Total CK: 132 U/L (ref 7–232)
Total CK: 118 U/L (ref 7–232)

## 2012-02-26 LAB — GLUCOSE, CAPILLARY: Glucose-Capillary: 134 mg/dL — ABNORMAL HIGH (ref 70–99)

## 2012-02-26 MED ORDER — ALBUTEROL SULFATE (5 MG/ML) 0.5% IN NEBU
2.5000 mg | INHALATION_SOLUTION | RESPIRATORY_TRACT | Status: DC
  Administered 2012-02-26 – 2012-02-28 (×11): 2.5 mg via RESPIRATORY_TRACT
  Filled 2012-02-26 (×11): qty 0.5

## 2012-02-26 MED ORDER — ENOXAPARIN SODIUM 100 MG/ML ~~LOC~~ SOLN
1.0000 mg/kg | SUBCUTANEOUS | Status: DC
Start: 2012-02-26 — End: 2012-02-26

## 2012-02-26 MED ORDER — IPRATROPIUM BROMIDE 0.02 % IN SOLN
0.5000 mg | RESPIRATORY_TRACT | Status: DC
  Administered 2012-02-26 – 2012-02-28 (×11): 0.5 mg via RESPIRATORY_TRACT
  Filled 2012-02-26 (×11): qty 2.5

## 2012-02-26 MED ORDER — NITROGLYCERIN IN D5W 200-5 MCG/ML-% IV SOLN: 2.0000 ug/min | INTRAVENOUS | Status: DC

## 2012-02-26 MED ORDER — POTASSIUM CHLORIDE CRYS ER 20 MEQ PO TBCR
20.0000 meq | EXTENDED_RELEASE_TABLET | Freq: Every day | ORAL | Status: AC
  Administered 2012-02-26 – 2012-02-27 (×2): 20 meq via ORAL
  Filled 2012-02-26 (×2): qty 1

## 2012-02-26 MED ORDER — ENOXAPARIN SODIUM 100 MG/ML ~~LOC~~ SOLN
1.0000 mg/kg | Freq: Two times a day (BID) | SUBCUTANEOUS | Status: DC
  Administered 2012-02-26 – 2012-02-28 (×5): 90 mg via SUBCUTANEOUS
  Filled 2012-02-26 (×7): qty 1

## 2012-02-26 MED ORDER — IPRATROPIUM BROMIDE 0.02 % IN SOLN: 0.5000 mg | RESPIRATORY_TRACT | Status: DC

## 2012-02-26 MED ORDER — ALBUTEROL SULFATE (5 MG/ML) 0.5% IN NEBU: 2.5000 mg | INHALATION_SOLUTION | RESPIRATORY_TRACT | Status: DC

## 2012-02-26 MED ORDER — ACETAMINOPHEN 325 MG PO TABS
650.0000 mg | ORAL_TABLET | Freq: Four times a day (QID) | ORAL | Status: DC | PRN
  Administered 2012-02-26 – 2012-03-02 (×4): 650 mg via ORAL
  Filled 2012-02-26 (×4): qty 2

## 2012-02-26 MED ORDER — TRAMADOL HCL 50 MG PO TABS
50.0000 mg | ORAL_TABLET | Freq: Two times a day (BID) | ORAL | Status: DC | PRN
  Administered 2012-02-29: 50 mg via ORAL
  Filled 2012-02-26: qty 1

## 2012-02-26 MED ORDER — ALBUTEROL SULFATE (5 MG/ML) 0.5% IN NEBU: 2.5000 mg | INHALATION_SOLUTION | RESPIRATORY_TRACT | Status: DC | PRN

## 2012-02-26 NOTE — Progress Notes (Signed)
NAME:  Oscar Hickman, Oscar Hickman NO.:  [id number]  MEDICAL RECORD NO.:  [id number]  LOCATION:  IC05                          FACILITY:  [facility]  PHYSICIAN:  Melvyn Novas, MDDATE OF BIRTH:  [birth year]  DATE OF PROCEDURE: DATE OF DISCHARGE:                                PROGRESS NOTE   The patient was admitted with accelerated hypertension and dyspnea.  Has nonischemic cardiomyopathy documented by cath in 2010.  Blood pressure is much improved today to 152/94.  He is currently in sinus rhythm. Potassium down to 3.1 today.  Creatinine 1.83.  Hemoglobin 10.6. Troponins increased to 4.19 today.  However, CK-MB is only 5.3 and total CK is 132.  I doubt this is significant myocardial ischemia or necrosis. However, we will repeat cardiac enzymes q.8 hours for an additional 24 hours.  We will also give IV nitroglycerin and increase Lovenox to full dose anticoagulation.  Likewise, we will add potassium chloride 20 mEq p.o. daily for hypokalemia.  VITAL SIGNS:  Temperature 98, pulse is 84 and regular, respiratory rate is 16, and O2 sat is 96%. NECK:  No JVD. LUNGS:  Clear to A and P with diminished breath sounds at bases.  No rales, wheeze, or rhonchi. HEART:  Regular rate and rhythm.  No S3, S4, or gallops appreciable.  I do not see cardiology consult requested yesterday managing aggressive conservative manner unless clear cut evidence of infarct occurs and we will transfer to [facility] if that evidence presents itself.     Melvyn Novas, MD     RMD/MEDQ  D:  02/26/2012  T:  02/26/2012  Job:  [id number]

## 2012-02-26 NOTE — Progress Notes (Signed)
Called critical troponin of 4.19 to [facility] MD, no new orders received.

## 2012-02-26 NOTE — Progress Notes (Signed)
[id number] 

## 2012-02-27 LAB — GLUCOSE, CAPILLARY
Glucose-Capillary: 129 mg/dL — ABNORMAL HIGH (ref 70–99)
Glucose-Capillary: 129 mg/dL — ABNORMAL HIGH (ref 70–99)

## 2012-02-27 LAB — CARDIAC PANEL(CRET KIN+CKTOT+MB+TROPI)
Relative Index: INVALID (ref 0.0–2.5)
Total CK: 84 U/L (ref 7–232)
Troponin I: 2.25 ng/mL (ref <0.30)

## 2012-02-27 LAB — BASIC METABOLIC PANEL
BUN: 13 mg/dL (ref 6–23)
Calcium: 9 mg/dL (ref 8.4–10.5)
Chloride: 105 mEq/L (ref 96–112)
Creatinine, Ser: 0.78 mg/dL (ref 0.50–1.35)
GFR calc Af Amer: >90 mL/min (ref >90)

## 2012-02-27 NOTE — Progress Notes (Signed)
[id number] 

## 2012-02-27 NOTE — Progress Notes (Addendum)
Paged Dr. Felecia Shelling for a critical troponin value of 2.25; no new orders given; will continue to monitor pt

## 2012-02-27 NOTE — Progress Notes (Signed)
Pt to be transferred to room 327 per MD order. Report called to RN. Pt transferred via wheelchair. Family members aware of transfer.

## 2012-02-27 NOTE — Progress Notes (Signed)
NAME:  Oscar Hickman, Oscar Hickman NO.:  [id number]  MEDICAL RECORD NO.:  [id number]  LOCATION:  IC05                          FACILITY:  [facility]  PHYSICIAN:  Melvyn Novas, MDDATE OF BIRTH:  27-Jul-1941  DATE OF PROCEDURE: DATE OF DISCHARGE:                                PROGRESS NOTE   The patient is admitted with dyspnea and accelerated hypertension. Blood pressure is better controlled today.  CK and MB are within normal limits.  Troponin is elevated at 2.25 today.  This has been a consistent pattern.  I am not convinced this is myocardial necrosis at present.  VITAL SIGNS:  Blood pressure is 139/94, temperature 98, pulse is 83 and regular, respiratory rate is 18, and O2 sat is 94%. NECK:  No JVD. LUNGS:  Clear to A and P with diminished breath sounds at bases.  No rales, wheeze, or rhonchi appreciable. HEART:  Regular rate and rhythm.  No S3 or S4. ABDOMEN:  benign.  Potassium 3.6, creatinine 0.78. Hemoglobin 10.6.  The patient denies anginal chest pain.  Denies dyspnea.  The patient appears hemodynamically stable at present.  PLAN:  Right now is to monitor BMET and cardiac enzymes throughout the next 24 hours.  Monitor EKG and we will wait for more cardiac cath consult which I do not have available to me right now, although we summarize this nonobstructive cardiomyopathy with an ejection fraction of 35%.     Melvyn Novas, MD     RMD/MEDQ  D:  02/27/2012  T:  02/27/2012  Job:  [id number]

## 2012-02-28 DIAGNOSIS — I509 Heart failure, unspecified: Secondary | ICD-10-CM

## 2012-02-28 DIAGNOSIS — I428 Other cardiomyopathies: Secondary | ICD-10-CM

## 2012-02-28 DIAGNOSIS — I10 Essential (primary) hypertension: Secondary | ICD-10-CM

## 2012-02-28 LAB — GLUCOSE, CAPILLARY
Glucose-Capillary: 158 mg/dL — ABNORMAL HIGH (ref 70–99)
Glucose-Capillary: 143 mg/dL — ABNORMAL HIGH (ref 70–99)
Glucose-Capillary: 198 mg/dL — ABNORMAL HIGH (ref 70–99)

## 2012-02-28 LAB — BASIC METABOLIC PANEL
CO2: 29 mEq/L (ref 19–32)
Calcium: 9.6 mg/dL (ref 8.4–10.5)
Creatinine, Ser: 0.77 mg/dL (ref 0.50–1.35)
GFR calc non Af Amer: >90 mL/min (ref >90)
Glucose, Bld: 146 mg/dL — ABNORMAL HIGH (ref 70–99)
Sodium: 138 mEq/L (ref 135–145)

## 2012-02-28 MED ORDER — FUROSEMIDE 40 MG PO TABS
40.0000 mg | ORAL_TABLET | Freq: Two times a day (BID) | ORAL | Status: DC
  Administered 2012-02-28 – 2012-03-02 (×6): 40 mg via ORAL
  Filled 2012-02-28 (×6): qty 1

## 2012-02-28 MED ORDER — CLONIDINE HCL 0.2 MG PO TABS
0.2000 mg | ORAL_TABLET | Freq: Three times a day (TID) | ORAL | Status: DC
  Administered 2012-02-28 – 2012-03-01 (×7): 0.2 mg via ORAL
  Filled 2012-02-28 (×7): qty 1

## 2012-02-28 MED ORDER — NITROGLYCERIN 0.4 MG SL SUBL
SUBLINGUAL_TABLET | SUBLINGUAL | Status: AC
  Filled 2012-02-28: qty 25

## 2012-02-28 MED ORDER — ENOXAPARIN SODIUM 40 MG/0.4ML ~~LOC~~ SOLN
40.0000 mg | Freq: Every day | SUBCUTANEOUS | Status: DC
  Administered 2012-02-29 – 2012-03-01 (×2): 40 mg via SUBCUTANEOUS
  Filled 2012-02-28 (×3): qty 0.4

## 2012-02-28 MED ORDER — AMLODIPINE BESYLATE 5 MG PO TABS
5.0000 mg | ORAL_TABLET | Freq: Every day | ORAL | Status: DC
  Administered 2012-02-29 – 2012-03-01 (×2): 5 mg via ORAL
  Filled 2012-02-28 (×3): qty 1

## 2012-02-28 NOTE — Progress Notes (Signed)
[id number] 

## 2012-02-28 NOTE — Consult Note (Signed)
CARDIOLOGY CONSULT NOTE  Patient ID: Oscar Hickman MRN: [id number] DOB/AGE: 70/02/1941 70 y.o.  Admit date: 02/25/2012 Referring Physician: [facility] Primary Lucious Groves, MD, MD Primary Cardiologist: Dietrich Pates Reason for Consultation: CHF, Positive Troponin. Active Problems:  DIABETES MELLITUS, TYPE II  HTN (hypertension)  Cardiomyopathy, nonischemic  HPI: Oscar Hickman is a 71 year old patient with known history of nonischemic cardiomyopathy (EF 35% per echo 2010), hypertension, diabetes, hypercholesterolemia, with recent history of prostate cancer, requiring chemotherapy through Dr. Garnette Gunner office. He presented to the emergency room with complaints of shortness of breath, positive, troponin of 4.19, with follow-up troponin of 2.75, 2.14, 2.25 respectively.  Elevated BNP of 5253. He states that the symptoms have been coming on over the last week, worsening over the last 3 days prior to admission. He was also found to be anemic with a hemoglobin of 10.6 and hematocrit 32.3. Chest x-ray did not reveal evidence of pleural effusion or CHF. He was found to be hypertensive on admission with a blood pressure 159/101. Initial EKG revealing normal sinus rhythm with PVCs and a left anterior fascicular block. Repeat EKG 2 days later revealed a T-wave abnormality in the lateral leads with T wave inversion noted in V5 and V6 with flattening inferiorly. He has not been seen in our office since January of 2012. At that time weight was 195 pounds vs admission weight of 206 pounds. He denies chest pain, dizziness or near syncope.    He admits to medical and dietary noncompliance. He states that his wife has been admitted to [facility], and he has been driving back and forth to [facility] to look after her. He has not been taking very good care of himself as a result of this. He also states that he has recently lost a child and is having to care for grandchildren now, which is causing a  lot of stress on him and his wife.   Review of systems complete and found to be negative unless listed above   Past Medical History  Diagnosis Date  . Hypertension   . Prostate ca   . Skin infection     history  . HTN (hypertension) 11/23/2011  . High cholesterol   . Diabetes mellitus   . Heart disease     pt states that he has been told that he has a "bad " heart    Family History  Problem Relation Age of Onset  . Cancer Mother   . Cancer Father     History   Social History  . Marital Status: Married    Spouse Name: N/A    Number of Children: N/A  . Years of Education: N/A   Occupational History  . Not on file.   Social History Main Topics  . Smoking status: Never Smoker   . Smokeless tobacco: Never Used  . Alcohol Use: No  . Drug Use: No  . Sexually Active:    Other Topics Concern  . Not on file   Social History Narrative  . No narrative on file    Past Surgical History  Procedure Date  . Cardiac catheterization 02/2009  . Transurethral resection of prostate 03/2008     Prescriptions prior to admission  Medication Sig Dispense Refill  . acetaminophen (TYLENOL) 500 MG tablet Take 1,000 mg by mouth every 6 (six) hours as needed. For headache pain      . ARTIFICIAL TEAR OP Apply 1 drop to eye daily as needed. Dry Eyes      .  aspirin EC 81 MG tablet Take 81 mg by mouth daily.      . bicalutamide (CASODEX) 50 MG tablet Take 50 mg by mouth daily.        . carvedilol (COREG) 25 MG tablet Take 25 mg by mouth 2 (two) times daily with a meal.      . hydrALAZINE (APRESOLINE) 50 MG tablet Take 50 mg by mouth 3 (three) times daily.      . isosorbide dinitrate (ISORDIL) 20 MG tablet Take 20 mg by mouth 3 (three) times daily.       . metFORMIN (GLUCOPHAGE) 500 MG tablet Take 500 mg by mouth 2 (two) times daily with a meal.        . pravastatin (PRAVACHOL) 40 MG tablet Take 1 tablet by mouth At bedtime.      . quinapril (ACCUPRIL) 40 MG tablet Take 40 mg by mouth daily.          Echocardiogram:02/11/2009 Left ventricle: Grade III diastolic dysfunction. Akinesis of the infero-posterior wall and hypokinesis of the other walls. The cavity size was mildly dilated. Wall thickness was increased in a pattern of moderate LVH. The estimated ejection fraction was 25%. - Aortic valve: Sclerosis without stenosis. Mild regurgitation. - Mitral valve: Calcified annulus. Mild regurgitation. - Left atrium: The atrium was severely dilated. - Right ventricle: The cavity size was moderately dilated. Systolic function was moderately reduced. - Right atrium: The atrium was severely dilated. - Pulmonary arteries: PA peak pressure: 61mm Hg (S). - Pericardium, extracardiac: A trivial pericardial effusion was identified posterior to the heart.  Cardiac Cath: April 2010 Cardiac Findings:  Left ventriculography: There is moderate left ventricular systolic  dysfunction. There is mild global hypokinesis present with severe hypokinesis  of the anterolateral wall. The estimated LVEF is 35%. There is no significant  mitral regurgitation visualized.  Left mainstem: Mild nonobstructive plaque in the mid shaft of the left main  with 20% stenosis the left main bifurcates into the LAD and left circumflex  LAD: The LAD is a large caliber vessel that courses down reaches the left  ventricular apex. There is a large first diagonal branch and second diagonal  branch. There is mild nonobstructive stenosis throughout the LAD and diagonal  branches. There no severe lesions present.  Left circumflex: The left circumflex is a moderate-sized vessel. The first OM  has mild nonobstructive plaque. The second OM is widely patent and is a major  branch of the left circumflex. The vessel also supplies an AV groove branch.  RCA: The vessel is large caliber and also supplies a large territory. There is  mild ectasia throughout. There is diffuse atherosclerosis with nonobstructive  plaque throughout the  proximal mid and distal portions of the vessel. Distally  the vessel branches into a PDA branch and large territory postero-lateral  branch. There are no significant stenoses in the branch vessels of the RCA.  Conclusions:  1. Mild, nonobstructive, diffuse CAD  2. Moderate global and segmental left ventricular systolic dysfunction  Physical Exam: Blood pressure 128/79, pulse 83, temperature 97.6 F (36.4 C), temperature source Oral, resp. rate 20, height 5\' 7"  (1.702 m), weight 200 lb 9.9 oz (91 kg), SpO2 100.00%.  General: Well developed, well nourished, in no acute distress Head: Eyes PERRLA, No xanthomas. Under eye swelling is noted  Normal cephalic and atramatic  Lungs: Few basilar rales; good breath sounds with prolongation of the expiratory phase Heart: HRRR S1 S2, 1/6 systolic murmur.  Pulses are 2+ &  equal.            No carotid bruit. No JVD.  No abdominal bruits. No femoral bruits. Abdomen: Bowel sounds are positive, abdomen soft and non-tender without masses or                  Hernia's noted. Msk:  Back normal, normal gait. Normal strength and tone for age. Extremities: No clubbing, cyanosis. 1+ edema.  DP +1 Neuro: Alert and oriented X 3. Psych:  Good affect, responds appropriately  Lab Results  Component Value Date   WBC 5.8 02/25/2012   HGB 10.6* 02/25/2012   HCT 32.3* 02/25/2012   MCV 86.8 02/25/2012   PLT 327 02/25/2012     Lab 02/28/12 0455 02/25/12 0956  NA 138 --  K 3.8 --  CL 101 --  CO2 29 --  BUN 13 --  CREATININE 0.77 --  CALCIUM 9.6 --  PROT -- 6.4  BILITOT -- 0.4  ALKPHOS -- 74  ALT -- 41  AST -- 35  GLUCOSE 146* --   Lab Results  Component Value Date   CKTOTAL 84 02/27/2012   CKMB 3.2 02/27/2012   TROPONINI 2.25* 02/27/2012    Lab Results  Component Value Date   CHOL  Value: 219        ATP III CLASSIFICATION:  <200     mg/dL   Desirable  119-147  mg/dL   Borderline High  >=829    mg/dL   High       * 03/13/2129   CHOL 218 02/10/2009   Lab  Results  Component Value Date   HDL 44 02/11/2009   HDL 44 02/10/2009   Lab Results  Component Value Date   LDLCALC  Value: 156        Total Cholesterol/HDL:CHD Risk Coronary Heart Disease Risk Table                     Men   Women  1/2 Average Risk   3.4   3.3  Average Risk       5.0   4.4  2 X Average Risk   9.6   7.1  3 X Average Risk  23.4   11.0        Use the calculated Patient Ratio above and the CHD Risk Table to determine the patient's CHD Risk.        ATP III CLASSIFICATION (LDL):  <100     mg/dL   Optimal  865-784  mg/dL   Near or Above                    Optimal  130-159  mg/dL   Borderline  696-295  mg/dL   High  >284     mg/dL   Very High* 11/10/2438   LDLCALC 156 02/10/2009   Lab Results  Component Value Date   TRIG 94 02/11/2009   Lab Results  Component Value Date   CHOLHDL 5.0 02/11/2009   No results found for this basename: LDLDIRECT     Radiology: CXR 02/25/2012 IMPRESSION: No active disease. Cardiomegaly again noted. Again noted prominent central pulmonary artery probable due to chronic pulmonary hypertension .  EKG: Normal sinus rhythm with left anterior fascicular block. He is T wave inversion noted.V4, 5, 6  with T wave flattening inferiorly. At 78 beats per minute.  ASSESSMENT AND PLAN:   1. Non-ST elevated myocardial infarction: Likely type II in the setting of dyspnea  with CHF. Doubt acute coronary syndrome.  EF of 25% in 2010.  We will repeat echocardiogram for reassessment of  LV function and evaluation for deterioration of patient's cardiac status. Weight has been variable, but I&O suggests a diuresis in excess of 2 L. Cardiac catheterization completed in 2010 demonstrated nonobstructive coronary artery disease that time. EKG reveals T wave inversion laterally with T wave flattening inferiorly.   2. Severe systolic dysfunction: Echocardiogram. Will be ordered as discussed above.  3. Hypertension: Currently well controlled on medications.   Oscar Hickman. Lyman Bishop  NP [facility] 02/28/2012, 4:08 PM  Cardiology Attending Patient interviewed and examined. Discussed with Joni Reining, NP.  Above note annotated and modified based upon my findings.  Patient presents with congestive heart failure in the setting of noncompliance with medical therapy and medical followup. No ischemic chest discomfort. Troponin is moderately elevated, but no elevation of CPK or MB. Abnormalities in cardiac markers are likely secondary to CHF, especially in the absence of obstructive coronary disease less than 3 years ago. No further assessment for coronary artery disease as necessary. Diuresis to a dry weight and optimal medical therapy for nonischemic cardiomyopathy will be our best approach.  [place] Bing, MD 02/28/2012, 5:43 PM

## 2012-02-28 NOTE — Progress Notes (Signed)
Inpatient Diabetes Program Recommendations  AACE/ADA: New Consensus Statement on Inpatient Glycemic Control  Target Ranges:  Prepandial:   less than 140 mg/dL      Peak postprandial:   less than 180 mg/dL (1-2 hours)      Critically ill patients:  140 - 180 mg/dL  Pager:  [phone] Hours:  8 am-10pm   Reason for Visit: Patient on Metformin  Inpatient Diabetes Program Recommendations Correction (SSI): Please place orders for CBG checks

## 2012-02-28 NOTE — Progress Notes (Signed)
NAME:  BRACEN, SCHUM NO.:  [id number]  MEDICAL RECORD NO.:  [id number]  LOCATION:  A327                          FACILITY:  [facility]  PHYSICIAN:  Melvyn Novas, MDDATE OF BIRTH:  01/18/1941  DATE OF PROCEDURE: DATE OF DISCHARGE:                                PROGRESS NOTE   The patient has history of noncritical obstructive coronary artery disease by cath in 2010.  I do not have details.  He has ejection fraction 35%.  Admitted with increasing dyspnea and accelerated hypertension, history of noncompliance with antihypertensives.  The patient had repeated elevated troponins, but CKs and MBs were essentially within normal limits.  I do not think this is ischemic in nature.  VITAL SIGNS:  Blood pressure is much better controlled at 152/96, temperature 98.1, pulse is 81 and regular, respiratory rate is 20. NECK:  No JVD. LUNGS:  Diminished breath sounds at the bases.  No rales, wheeze, or rhonchi appreciable. HEART:  Regular rate and rhythm.  No S3 or S4.  Potassium 3.8, creatinine 0.77.  PLAN:  Right now is to increase clonidine from 0.1-0.2 p.o. t.i.d. for better blood pressure control.  Continue hydralazine, Imdur, lisinopril, and Norvasc.  Monitor hemodynamics and electrolytes and renal function. We will await cardiology consultation for cath results and further recommendations for prevention of recurrence.     Melvyn Novas, MD     RMD/MEDQ  D:  02/28/2012  T:  02/28/2012  Job:  [id number]

## 2012-02-29 DIAGNOSIS — I059 Rheumatic mitral valve disease, unspecified: Secondary | ICD-10-CM

## 2012-02-29 LAB — GLUCOSE, CAPILLARY
Glucose-Capillary: 170 mg/dL — ABNORMAL HIGH (ref 70–99)
Glucose-Capillary: 225 mg/dL — ABNORMAL HIGH (ref 70–99)

## 2012-02-29 NOTE — Progress Notes (Signed)
*  PRELIMINARY RESULTS* Echocardiogram 2D Echocardiogram has been performed.  Oscar Hickman 02/29/2012, 12:08 PM

## 2012-02-29 NOTE — Progress Notes (Signed)
[id number] 

## 2012-02-29 NOTE — Progress Notes (Signed)
NAME:  Oscar Hickman, OLDEN NO.:  [id number]  MEDICAL RECORD NO.:  [id number]  LOCATION:  A327                          FACILITY:  [facility]  PHYSICIAN:  Melvyn Novas, MDDATE OF BIRTH:  Dec 23, 1940  DATE OF PROCEDURE: DATE OF DISCHARGE:                                PROGRESS NOTE   Blood pressure 143/84, temperature 99, pulse 84 and regular, respiratory rate is 20.  Ejection fraction estimated between 25-35%.  Repeat echocardiogram pending, I believe at this time.  He has chronic congestive nonischemic cardiomyopathy, history of prostate cancer, TURP, hypertension, hyperlipidemia, chronic noncompliance and switch protamine with accelerated hypertension and dyspnea.  Blood pressure better controlled.  Neck shows no JVD.  Lungs showed diminished breath sounds at the bases with prolonged expiratory phase.  No rales, no wheeze, appreciable heart regular rhythm.  No S3, S4 appreciable.  PLAN:  Right now is to monitor renal function.  Await to repeat echocardiogram.  We will ambulate the patient.  Continue Norvasc 5, benazepril 20, Coreg 12.5 p.o. b.i.d., clonidine 0.2 p.o. t.i.d., Lasix 40 mg p.o. b.i.d., hydralazine 50 mg p.o. q.8 h., Imdur 30 mg per day, Zocor 20 mg per day and Flomax 0.4.  The patient is hemodynamically stable at present.     Melvyn Novas, MD     RMD/MEDQ  D:  02/29/2012  T:  02/29/2012  Job:  [id number]

## 2012-02-29 NOTE — Progress Notes (Addendum)
SUBJECTIVE:Breathing a feeling better. No chest discomfort. He continues to be worried about his wife who is admitted to [facility].  Active Problems:  HTN (hypertension)  Cardiomyopathy, nonischemic  DIABETES MELLITUS, TYPE II   Basename 02/28/12 0455 02/27/12 0511  NA 138 141  K 3.8 3.6  CL 101 105  CO2 29 29  GLUCOSE 146* 129*  BUN 13 13  CREATININE 0.77 0.78  CALCIUM 9.6 9.0  MG -- --  PHOS -- --   Cardiac Enzymes:  Basename 02/27/12 0510 02/26/12 1655 02/26/12 0942  CKTOTAL 84 118 145  CKMB 3.2 4.3* 4.9*  CKMBINDEX -- -- --  TROPONINI 2.25* 2.14* 2.75*  Echocardiogram: Pending  RADIOLOGY: 02/25/2012 IMPRESSION: No active disease. Cardiomegaly again noted. Again noted prominent central pulmonary artery probable due to chronic pulmonary hypertension .  PHYSICAL EXAM BP 143/84  Pulse 79  Temp(Src) 99 F (37.2 C) (Oral)  Resp 20  Ht 5\' 7"  (1.702 m)  Wt 192 lb 7.4 oz (87.3 kg)  BMI 30.14 kg/m2  SpO2 98%WT Loss: 8 lbs since admission. General: Well developed, well nourished, in no acute distress Head: Eyes PERRLA, No xanthomas.   Normal cephalic and atramatic  Lungs: Clear bilaterally to auscultation and percussion. Heart: HRRR S1 S2, distant hear sounds with No MRG .  Pulses are 2+ & equal.            No carotid bruit. Minimal JVD.  No abdominal bruits. No femoral bruits. Abdomen: Bowel sounds are positive, abdomen soft and non-tender without masses or                  Hernia's noted. Msk:  Back normal, normal gait. Normal strength and tone for age. Extremities: No clubbing, cyanosis or non-pitting edema.  DP +1 Neuro: Alert and oriented X 3. Psych:  Good affect, responds appropriately  TELEMETRY: Reviewed telemetry pt in NSR.  Weight:  87.3, decreased 4 kg over the past 48 hours  I&O:  -2.5 L since admission, but +600 cc today with 0 urine output entered so far.  ASSESSMENT AND PLAN:  1. NSTEMI Type II: Not true ACS, but elevated troponins related to CHF.  He has no complaint so of chest pain. Will continue current medications.  2. Severe systolic dysfunction: Repeat echo is planned for today. He has diuresed 9 lbs since admission. Will remain on po lasix 40 mg BID.  Continue to monitor kidney fx and lytes. Creatinine stable. Low sodium diet. Bettey Mare. Lyman Bishop NP [facility] 02/29/2012, 9:17 AM  Cardiology Attending Patient interviewed and examined. Discussed with Joni Reining, NP.  Above note annotated and modified based upon my findings.  Clinically improved. Weight is approaching baseline.  Current medical therapy is near optimal. Aldactone would be desirable, but will not add to his regimen until we are certain that adequate followup and lab testing can be assured.  [place] Bing, MD 02/29/2012, 5:45 PM

## 2012-03-01 LAB — BASIC METABOLIC PANEL
BUN: 13 mg/dL (ref 6–23)
CO2: 33 mEq/L — ABNORMAL HIGH (ref 19–32)
Calcium: 9.5 mg/dL (ref 8.4–10.5)
Creatinine, Ser: 0.77 mg/dL (ref 0.50–1.35)
GFR calc non Af Amer: >90 mL/min (ref >90)
Glucose, Bld: 218 mg/dL — ABNORMAL HIGH (ref 70–99)

## 2012-03-01 LAB — GLUCOSE, CAPILLARY: Glucose-Capillary: 167 mg/dL — ABNORMAL HIGH (ref 70–99)

## 2012-03-01 MED ORDER — BIOTENE DRY MOUTH MT LIQD: 15.0000 mL | OROMUCOSAL | Status: DC | PRN

## 2012-03-01 MED ORDER — SODIUM CHLORIDE 0.9 % IJ SOLN
INTRAMUSCULAR | Status: AC
  Administered 2012-03-01: 09:00:00
  Filled 2012-03-01: qty 3

## 2012-03-01 MED ORDER — CLONIDINE HCL 0.2 MG PO TABS
0.3000 mg | ORAL_TABLET | Freq: Three times a day (TID) | ORAL | Status: DC
  Administered 2012-03-01 – 2012-03-02 (×3): 0.3 mg via ORAL
  Filled 2012-03-01 (×3): qty 1

## 2012-03-01 NOTE — Progress Notes (Signed)
Left VM message for Rosemary Holms RN CM regarding the need for chronic disease management services for this patient.  [employer] now has a hospital liaison available.  Her name is Scharlene Corn.  Her number is [phone]. For any additional questions or new referrals please contact Anibal Henderson BSN RN [facility] Liaison at [phone].

## 2012-03-01 NOTE — Progress Notes (Signed)
NAME:  NGAI, PARCELL NO.:  [id number]  MEDICAL RECORD NO.:  [id number]  LOCATION:  A327                          FACILITY:  [facility]  PHYSICIAN:  Melvyn Novas, MDDATE OF BIRTH:  10-02-1941  DATE OF PROCEDURE: DATE OF DISCHARGE:                                PROGRESS NOTE   The patient has nonischemic cardiomyopathy.  He had elevated troponins and negative CKs and MBs and thought not to be ischemic or necrotic myocardium, rather CHF.  The patient's wife just went home from the hospital today.  He does have some dyspnea on exertion.  He has weakness transferring bed to chair and ambulating.  VITAL SIGNS:  Blood pressure is still elevated today 169/89. LUNGS:  Diminished breath sounds at bases.  No rales, wheezes, or rhonchi appreciable.  HEART:  Regular rhythm.  No S3 or S4 appreciable. EXTREMITIES:  1+ pedal edema.  Plan right now is to get home health consult, face-to-face with physical therapy and nursing services for strengthening ambulation as well as organization and supervision of medicines on discharge possibly tomorrow.  Increase clonidine to 0.3 p.o. t.i.d., monitor blood pressure and get BMET today and in a.m. as well as magnesium.     Melvyn Novas, MD     RMD/MEDQ  D:  03/01/2012  T:  03/01/2012  Job:  [id number]

## 2012-03-01 NOTE — Progress Notes (Signed)
[id number] 

## 2012-03-02 ENCOUNTER — Encounter (HOSPITAL_COMMUNITY): Payer: Medicare HMO

## 2012-03-02 ENCOUNTER — Ambulatory Visit (HOSPITAL_COMMUNITY): Admission: RE | Admit: 2012-03-02 | Payer: Medicare HMO | Source: Ambulatory Visit

## 2012-03-02 LAB — GLUCOSE, CAPILLARY: Glucose-Capillary: 158 mg/dL — ABNORMAL HIGH (ref 70–99)

## 2012-03-02 MED ORDER — CLONIDINE HCL 0.3 MG PO TABS: 0.3000 mg | ORAL_TABLET | Freq: Three times a day (TID) | ORAL | Status: DC

## 2012-03-02 MED ORDER — TAMSULOSIN HCL 0.4 MG PO CAPS: 0.4000 mg | ORAL_CAPSULE | Freq: Every day | ORAL | Status: DC

## 2012-03-02 MED ORDER — AMLODIPINE BESYLATE 5 MG PO TABS: 5.0000 mg | ORAL_TABLET | Freq: Every day | ORAL | Status: DC

## 2012-03-02 NOTE — Progress Notes (Signed)
Discharge instructions given on medications,and follow up visits,patient verbalized understanding.No c/o pain or discomfort noted.Prescriptions given to family.Accompanied by staff to an awaiting vehicle.

## 2012-03-02 NOTE — Discharge Summary (Signed)
NAME:  CHRISTOPH, COPELAN NO.:  [id number]  MEDICAL RECORD NO.:  [id number]  LOCATION:  A327                          FACILITY:  [facility]  PHYSICIAN:  Melvyn Novas, MDDATE OF BIRTH:  [birth year]  DATE OF ADMISSION:  02/25/2012 DATE OF DISCHARGE:  LH                              DISCHARGE SUMMARY   The patient is a 71 year old black male with a long history of noncompliance and fear of medicines with history of hypertension, nonischemic cardiomyopathy with nonobstructive coronary artery disease, documented by cath in 2010, hypertension, hyperlipidemia, type 2 diabetes, status post prostate carcinoma with bladder outlet obstruction, status post TURP 1 year ago.  The patient came in with accelerated hypertension.  Blood pressure 210/108 and some increasing dyspnea.  He was admitted for accelerated hypertension and presumed congestive heart failure the basis of that.  In the face of cardiomyopathy, he had most of his blood pressure medicines resumed with normalization of blood pressures.  His troponin's were consistently elevated.  However, CKs and MB were within normal limits throughout the 48 hours NICU.  His blood pressure was much better controlled, seen in consultation by Cardiology, who felt that his troponin's were elevated on the basis of CHF and not anything ischemic.  EKG remained unchanged. The patient denied any anginal chest pain.  He had good glycemic control.  The patient has blood pressure further optimized with the addition of Norvasc 5 mg per day and clonidine increasing in titrating doses up to 23 mg p.o. t.i.d.  His blood pressure was 120/68 on the day of discharge.  The patient was urged to take all new medicines. Regularly home health nurse was contacted for home care as he and his wife were convalescing simultaneously and his basic problem is also organization of medicines which I believe his wife did per him previously.  His discharge  medicines include: 1. Norvasc 5 mg p.o. daily. 2. Aspirin 81 mg p.o. daily. 3. Accupril 40 mg p.o. daily.  This is ACE inhibitor. 4. Carvedilol 25 mg p.o. b.i.d. 5. Clonidine 0.3 mg p.o. t.i.d. 6. Lasix 40 mg p.o. b.i.d. 7. Hydralazine 50 mg p.o. t.i.d. 8. Metformin 500 mg p.o. b.i.d. 9. Sublingual nitroglycerin 0.4 mg p.o. t.i.d. p.r.n. 10.Pravachol 40 mg p.o. daily. 11.Zocor 20 mg p.o. daily.     Melvyn Novas, MD     RMD/MEDQ  D:  03/02/2012  T:  03/02/2012  Job:  [id number]

## 2012-03-02 NOTE — Discharge Summary (Signed)
[id number] 

## 2012-03-06 NOTE — Progress Notes (Signed)
UR Chart Review Completed  

## 2012-04-14 ENCOUNTER — Encounter (HOSPITAL_COMMUNITY): Payer: Medicare Other | Attending: Oncology | Admitting: Oncology

## 2012-04-14 VITALS — BP 121/73 | HR 72 | Temp 95.7°F | Wt 189.5 lb

## 2012-04-14 DIAGNOSIS — C61 Malignant neoplasm of prostate: Secondary | ICD-10-CM

## 2012-04-14 LAB — COMPREHENSIVE METABOLIC PANEL
ALT: 8 U/L (ref 0–53)
AST: 12 U/L (ref 0–37)
Alkaline Phosphatase: 94 U/L (ref 39–117)
CO2: 25 mEq/L (ref 19–32)
Calcium: 10 mg/dL (ref 8.4–10.5)
GFR calc Af Amer: 62 mL/min — ABNORMAL LOW (ref >90)
Glucose, Bld: 105 mg/dL — ABNORMAL HIGH (ref 70–99)
Potassium: 3.9 mEq/L (ref 3.5–5.1)
Sodium: 138 mEq/L (ref 135–145)
Total Protein: 7.4 g/dL (ref 6.0–8.3)

## 2012-04-14 LAB — CBC
MCV: 84.5 fL (ref 78.0–100.0)
Platelets: 315 10*3/uL (ref 150–400)
RBC: 4.14 MIL/uL — ABNORMAL LOW (ref 4.22–5.81)
RDW: 15 % (ref 11.5–15.5)
WBC: 5.3 10*3/uL (ref 4.0–10.5)

## 2012-04-14 LAB — DIFFERENTIAL
Basophils Absolute: 0 10*3/uL (ref 0.0–0.1)
Eosinophils Absolute: 0.4 10*3/uL (ref 0.0–0.7)
Eosinophils Relative: 8 % — ABNORMAL HIGH (ref 0–5)
Lymphocytes Relative: 25 % (ref 12–46)
Lymphs Abs: 1.3 10*3/uL (ref 0.7–4.0)
Neutrophils Relative %: 58 % (ref 43–77)

## 2012-04-14 NOTE — Progress Notes (Signed)
Oscar Stalling, MD, MD [address] [place] [address]  1. ADENOCARCINOMA, PROSTATE  amLODipine (NORVASC) 10 MG tablet, bicalutamide (CASODEX) 50 MG tablet, CBC, Differential, Comprehensive metabolic panel, CT Abdomen Pelvis W Contrast, NM Bone Scan Whole Body, PSA, PSA, CBC, Differential, Comprehensive metabolic panel    CURRENT THERAPY: Depo-Lupon 22.5 mg every 12 weeks and Casodex daily. S/P biopsy and fulguration March 2011. S/P TURP followed by radiation in 2009.   INTERVAL HISTORY: Oscar Hickman 71 y.o. 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 Indwelling Foley catheter removed. Now on Depo-Lupro and Casodex. 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  On out last encounter, we scheduled the patient for a CT abd/pelvis and bone scan for radiographic evidence for a reason for his increasing PSA.  This was the second time it was ordered and scheduled and the patient missed that appointment due to a hospital admission and failed to reschedule.    He is here today for follow-up.  We spent some time discussing the importance of radiographic studies for his increasing PSA.  He is agreeable to have them performed next week.  So I have placed the orders again for the third time and hopefully he complies and is well at the time of the scheduled appointment.  He was admitted for elevated blood pressure.  Unfortunately, his wife was admitted recently for CHF as well.   We have not had blood work in 2-3 months so this will be performed today.  He is agreeable to have scans performed.   The patient denies any complaints.     Past Medical History  Diagnosis Date  . Hypertension   . Prostate ca   . Skin infection     history  . HTN (hypertension) 11/23/2011  . High cholesterol   . Diabetes  mellitus   . Heart disease     pt states that he has been told that he has a "bad " heart    has ADENOCARCINOMA, PROSTATE; DIABETES MELLITUS, TYPE II; HYPERLIPIDEMIA; HTN (hypertension); and Cardiomyopathy, nonischemic on his problem list.      has no known allergies.  Mr. Gleed had no medications administered during this visit.  Past Surgical History  Procedure Date  . Cardiac catheterization 02/2009  . Transurethral resection of prostate 03/2008    Denies any headaches, dizziness, double vision, fevers, chills, night sweats, nausea, vomiting, diarrhea, constipation, chest pain, heart palpitations, shortness of breath, blood in stool, black tarry stool, urinary pain, urinary burning, urinary frequency, hematuria, pain.   PHYSICAL EXAMINATION  ECOG PERFORMANCE STATUS: 1 - Symptomatic but completely ambulatory  Filed Vitals:   04/14/12 1145  BP: 121/73  Pulse: 72  Temp: 95.7 F (35.4 C)    GENERAL:alert, no distress, well nourished, well developed, comfortable, cooperative and smiling SKIN: skin color, texture, turgor are normal, no rashes or significant lesions HEAD: Normocephalic, No masses, lesions, tenderness or abnormalities EYES: normal, Conjunctiva are pink and non-injected EARS: External ears normal OROPHARYNX:lips, buccal mucosa, and tongue normal and mucous membranes are moist  NECK: supple, no adenopathy, trachea midline LYMPH:  no palpable lymphadenopathy BREAST:not examined LUNGS: clear to auscultation and percussion HEART: regular rate & rhythm, no murmurs, no gallops, S1 normal and S2 normal ABDOMEN:abdomen soft, non-tender and normal bowel sounds BACK: Back symmetric, no curvature. EXTREMITIES:less then 2 second capillary  refill, no joint deformities, effusion, or inflammation, no edema, no skin discoloration, no clubbing, no cyanosis  NEURO: alert & oriented x 3 with fluent speech, no focal motor/sensory deficits, gait normal   PENDING LABS: CBC diff,  CMET, PSA    ASSESSMENT:  1. 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 Indwelling Foley catheter removed. Now on Depo-Lupro and Casodex. 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  2. 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 DM  4. HTN 5. Non-compliance   PLAN:  1. Lab work today: CBC diff, CMET, PSA 2. CT abd/pelvis with contrast next week scheduled for 6/13 3. Bone scan scheduled for 6/13 4. Encouraged the patient to have #2 and 3 performed sooner rather than later due to missed appointments in the past. 5. Patient education regarding increasing PSA. 6. Return in 3-4 weeks for follow-up.  All questions were answered. The patient knows to call the clinic with any problems, questions or concerns. We can certainly see the patient much sooner if necessary.  The patient and plan discussed with Si Gaul, MD and he is in agreement with the aforementioned.  [name]

## 2012-04-14 NOTE — Patient Instructions (Addendum)
[facility]  Discharge Instructions  Oscar Hickman  [id number] 04-20-1941 Dr. Glenford Peers    RECOMMENDATIONS MADE BY THE CONSULTANT AND ANY TEST RESULTS WILL BE SENT TO YOUR REFERRING DOCTOR.   EXAM FINDINGS BY MD TODAY AND SIGNS AND SYMPTOMS TO REPORT TO CLINIC OR PRIMARY MD:   Labs drawn today include CBC diff, CMET, PSA  CT scan of abd/pelvis next Thursday April 20, 2012 @ 1:45. Start drinking your 1st bottle of contrast at 11:30. 2nd bottle of contrast at 12:30.  Bone scan Thursday April 20, 2012. Injection @ 10am. They will scan your body at 1:00.  Return to see [name] on Thursday June 27 @ 11:30    I acknowledge that I have been informed and understand all the instructions given to me and received a copy. I do not have any more questions at this time, but understand that I may call the Specialty Clinic at [facility] at [phone] during business hours should I have any further questions or need assistance in obtaining follow-up care.    __________________________________________  _____________  __________ Signature of Patient or Authorized Representative            Date                   Time    __________________________________________ Nurse's Signature

## 2012-04-20 ENCOUNTER — Encounter (HOSPITAL_COMMUNITY): Payer: Self-pay

## 2012-04-20 ENCOUNTER — Ambulatory Visit (HOSPITAL_COMMUNITY)
Admission: RE | Admit: 2012-04-20 | Discharge: 2012-04-20 | Disposition: A | Discharge status: Home / Self Care | Payer: Medicare Other | Source: Ambulatory Visit | Attending: Oncology | Admitting: Oncology

## 2012-04-20 ENCOUNTER — Encounter (HOSPITAL_COMMUNITY)
Admission: RE | Admit: 2012-04-20 | Discharge: 2012-04-20 | Disposition: A | Discharge status: Home / Self Care | Payer: Medicare Other | Source: Ambulatory Visit | Attending: Oncology | Admitting: Oncology

## 2012-04-20 DIAGNOSIS — K429 Umbilical hernia without obstruction or gangrene: Secondary | ICD-10-CM | POA: Insufficient documentation

## 2012-04-20 DIAGNOSIS — N1330 Unspecified hydronephrosis: Secondary | ICD-10-CM | POA: Insufficient documentation

## 2012-04-20 DIAGNOSIS — K4090 Unilateral inguinal hernia, without obstruction or gangrene, not specified as recurrent: Secondary | ICD-10-CM | POA: Insufficient documentation

## 2012-04-20 DIAGNOSIS — C61 Malignant neoplasm of prostate: Secondary | ICD-10-CM

## 2012-04-20 DIAGNOSIS — R972 Elevated prostate specific antigen [PSA]: Secondary | ICD-10-CM | POA: Insufficient documentation

## 2012-04-20 DIAGNOSIS — N2889 Other specified disorders of kidney and ureter: Secondary | ICD-10-CM | POA: Insufficient documentation

## 2012-04-20 MED ORDER — IOHEXOL 300 MG/ML  SOLN
100.0000 mL | Freq: Once | INTRAMUSCULAR | Status: AC | PRN
  Administered 2012-04-20: 100 mL via INTRAVENOUS

## 2012-04-20 MED ORDER — TECHNETIUM TC 99M MEDRONATE IV KIT
25.0000 | PACK | Freq: Once | INTRAVENOUS | Status: AC | PRN
  Administered 2012-04-20: 25 via INTRAVENOUS

## 2012-05-04 ENCOUNTER — Encounter (HOSPITAL_BASED_OUTPATIENT_CLINIC_OR_DEPARTMENT_OTHER): Payer: Medicare Other | Admitting: Oncology

## 2012-05-04 VITALS — BP 179/106 | HR 82 | Temp 98.0°F | Ht 67.0 in | Wt 190.0 lb

## 2012-05-04 DIAGNOSIS — C61 Malignant neoplasm of prostate: Secondary | ICD-10-CM

## 2012-05-04 DIAGNOSIS — Z5111 Encounter for antineoplastic chemotherapy: Secondary | ICD-10-CM

## 2012-05-04 DIAGNOSIS — C7919 Secondary malignant neoplasm of other urinary organs: Secondary | ICD-10-CM

## 2012-05-04 DIAGNOSIS — I10 Essential (primary) hypertension: Secondary | ICD-10-CM

## 2012-05-04 MED ORDER — ABIRATERONE ACETATE 250 MG PO TABS: 1000.0000 mg | ORAL_TABLET | Freq: Every day | ORAL | Status: DC

## 2012-05-04 MED ORDER — LEUPROLIDE ACETATE (3 MONTH) 22.5 MG IM KIT
22.5000 mg | PACK | Freq: Once | INTRAMUSCULAR | Status: AC
  Administered 2012-05-04: 22.5 mg via INTRAMUSCULAR
  Filled 2012-05-04: qty 22.5

## 2012-05-04 NOTE — Progress Notes (Signed)
Oscar Stalling, MD [address] [place] [address]  1. ADENOCARCINOMA, PROSTATE  PSA, PSA, PSA, CBC, Differential, Comprehensive metabolic panel    CURRENT THERAPY:Depo-Lupon 22.5 mg every 12 weeks and Casodex daily. S/P biopsy and fulguration March 2011. S/P TURP followed by radiation in 2009.   INTERVAL HISTORY: Oscar Hickman 72 y.o. 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 Indwelling Foley catheter removed. Now on Depo-Lupro and Casodex. 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   Oscar Hickman is here to review his recent radiographic studies.  He is accompanied by his wife.  He underwent a bone scan that did not reveal any bone metastases.  He also had a CT abd/pelvis which did not show any clear evidence of disease but questionable L4 sclerotic lesion and a metastasis cannot be ruled out.  Both reports follow below.   With this information and his increasing PSA, I offered the patient 3 options.  1. Taxotere chemotherapy which would consist of a port-a-cath being inserted.  We discussed the potential benefits of systemic chemotherapy including remission and palliation.  We discussed the side effects as well.  Oscar Hickman is adamently against systemic chemotherapy.  2. Zytiga/Xtandi.  I discussed that these are oral pills and they provide prolongation of life by about 6-8 months.  The downside of this medication is the cost.  He is interested in this option.  3. Observation of PSA and symptom management.  He is agreeable to this option as well.   So we had a long discussion regarding these options.  He is not interested in systemic chemotherapy.  He would like to be observed and symptom management (which he has no symptoms presently).  In the meantime, we will try to get him Zytiga/Xtandi for  him at an affordable cost, but assistance for these medication have run out and therefore it will likely be very costly.  They are interested in knowing the cost.   He denies any complaints today.  Past Medical History  Diagnosis Date  . Hypertension   . Prostate ca   . Skin infection     history  . HTN (hypertension) 11/23/2011  . High cholesterol   . Diabetes mellitus   . Heart disease     pt states that he has been told that he has a "bad " heart    has ADENOCARCINOMA, PROSTATE; DIABETES MELLITUS, TYPE II; HYPERLIPIDEMIA; HTN (hypertension); and Cardiomyopathy, nonischemic on his problem list.      has no known allergies.  Oscar Hickman does not currently have medications on file.  Past Surgical History  Procedure Date  . Cardiac catheterization 02/2009  . Transurethral resection of prostate 03/2008    Denies any headaches, dizziness, double vision, fevers, chills, night sweats, nausea, vomiting, diarrhea, constipation, chest pain, heart palpitations, shortness of breath, blood in stool, black tarry stool, urinary pain, urinary burning, urinary frequency, hematuria.   PHYSICAL EXAMINATION  ECOG PERFORMANCE STATUS: 1 - Symptomatic but completely ambulatory  Filed Vitals:   05/04/12 1035  BP: 179/106  Pulse: 82  Temp: 98 F (36.7 C)    GENERAL:alert, no distress, well nourished, well developed, comfortable, cooperative, smiling and demented SKIN: skin color, texture, turgor are normal, no rashes or significant lesions HEAD: Normocephalic, No masses, lesions, tenderness or abnormalities EYES: normal, Conjunctiva are pink and non-injected EARS:  External ears normal OROPHARYNX:lips, buccal mucosa, and tongue normal and mucous membranes are moist  NECK: supple, trachea midline LYMPH:  not examined BREAST:not examined LUNGS: not examined HEART: not examined ABDOMEN:not examined BACK: not examined EXTREMITIES:not examined  NEURO: no focal motor/sensory  deficits   LABORATORY DATA: Lab Results  Component Value Date   PSA 2.76 04/14/2012   PSA 1.63 01/19/2012   PSA 0.55 11/23/2011     RADIOGRAPHIC STUDIES:  04/20/2012  *RADIOLOGY REPORT*  Clinical Data: Increasing PSA, evaluate for prostatic prostate  cancer  CT ABDOMEN AND PELVIS WITH CONTRAST  Technique: Multidetector CT imaging of the abdomen and pelvis was  performed following the standard protocol during bolus  administration of intravenous contrast. Sagittal and coronal MPR  images reconstructed from axial data set.  Contrast: OMNIPAQUE IOHEXOL 300 MG/ML SOLN Dilute oral  contrast.  Comparison: None  Findings:  Tiny amounts of pleural fluid bilaterally greater on left.  Right hydronephrosis and proximal ureteral dilatation with the  presence of a right ureteral stent.  Distal pigtail of the right ureteral stent is within the urethra.  Peripelvic and proximal right peri ureteral edema could be related  to stent placement.  Minimal pelviectasis of the left kidney without caliectasis or  gross hydronephrosis.  Liver, spleen, pancreas, kidneys, and adrenal glands otherwise  normal appearance. Left ureter decompressed.  Minimal bladder wall thickening.  Surgical clips at prostate bed.  Small left inguinal hernia containing fat.  Normal appendix.  Small umbilical hernia containing fat.  Stomach and bowel loops normal appearance.  Scattered atherosclerotic calcifications.  No mass, adenopathy, free fluid, or inflammatory process.  Tiny dense sclerotic focus within left lateral aspect of L4  vertebral body, nonspecific, could represent a small bone but  cannot exclude a tiny sclerotic osseous metastasis.  No other sclerotic osseous abnormalities identified.  IMPRESSION:  Right hydronephrosis and proximal ureteral dilatation with  surrounding peri nephric and periureteral edema which could be  related to prior right ureteral stent placement.  If this stent has not  been recently placed, then other causes for  peripelvic/perinephric edema should be considered including tumor  and perforation.  The distal pigtail of the right ureteral stent is within the  urethra.  No definite evidence of prostate cancer metastasis.  Tiny sclerotic focus L4 vertebral body could represent a bone  island but cannot exclude a subtle sclerotic metastatic lesion.  Small left inguinal and umbilical hernias containing fat.  Original Report Authenticated By: Lollie Marrow, M.D.        04/20/2012  *RADIOLOGY REPORT*  Clinical Data: Elevated PSA, evaluate for prostate metastasis  NUCLEAR MEDICINE WHOLE BODY BONE SCINTIGRAPHY  Technique: Whole body anterior and posterior images were obtained  approximately 3 hours after intravenous injection of  radiopharmaceutical.  Radiopharmaceutical: CURIE TC-MDP TECHNETIUM TC 13M  MEDRONATE IV KIT  Comparison: Bone scan 02/11/2010  Findings: No focal uptake within the axillary or appendicular  skeleton to suggest metastasis. Mild hydroureter on the right.  IMPRESSION:  No scintigraphic evidence of skeletal metastasis.  Mild hydronephrosis on the right.  Original Report Authenticated By: Genevive Bi, M.D.    ASSESSMENT:  1. 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 Indwelling Foley catheter removed. Now on Depo-Lupro and Casodex. 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  2. 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 DM  4. HTN    PLAN:  1. I personally reviewed and went over laboratory results with the patient. 2. I personally reviewed and went over radiographic studies with the patient. 3. Depo-Lupron injection today. 4. Depo-Lupron injection in 3 months. 5. Continue Casodex for now.  6. Discussed his three options as described above.  7. Lab work every 6 weeks: PSA 8. Lab  work in 3 months: CBC diff, CMET, PSA 9. Will continue observation and symptom management as needed.  10. Return in 3 months for follow-up.  Patient has declined systemic chemotherapy.  Will attempt to get him oral Zytiga/Xtandi.  All questions were answered. The patient knows to call the clinic with any problems, questions or concerns. We can certainly see the patient much sooner if necessary.  The patient and plan discussed with Glenford Peers, MD and he is in agreement with the aforementioned.  Oscar Hickman

## 2012-05-04 NOTE — Progress Notes (Signed)
Oscar Hickman presents today for injection per MD orders. Lupron Depot 22.5 mg administered IM in left Gluteal. Administration without incident. Patient tolerated well.

## 2012-05-04 NOTE — Patient Instructions (Signed)
[facility]  Discharge Instructions Oscar Hickman  [id number] Feb 14, 1941 Dr. Glenford Peers  RECOMMENDATIONS MADE BY THE CONSULTANT AND ANY TEST RESULTS WILL BE SENT TO YOUR REFERRING DOCTOR.   EXAM FINDINGS BY MD TODAY AND SIGNS AND SYMPTOMS TO REPORT TO CLINIC OR PRIMARY MD:  Discussed chemo-declined Will try to get a new pill--Zytiga   INSTRUCTIONS GIVEN AND DISCUSSED: We will do labs every 6 weeks and see you back in 3 months    I acknowledge that I have been informed and understand all the instructions given to me and received a copy. I do not have any more questions at this time, but understand that I may call the Specialty Clinic at [facility] at [phone] during business hours should I have any further questions or need assistance in obtaining follow-up care.    __________________________________________  _____________  __________ Signature of Patient or Authorized Representative            Date                   Time    __________________________________________ Nurse's Signature

## 2012-05-09 ENCOUNTER — Other Ambulatory Visit (HOSPITAL_COMMUNITY): Payer: Self-pay | Admitting: Oncology

## 2012-05-09 ENCOUNTER — Telehealth (HOSPITAL_COMMUNITY): Payer: Self-pay | Admitting: Oncology

## 2012-05-09 DIAGNOSIS — C61 Malignant neoplasm of prostate: Secondary | ICD-10-CM

## 2012-05-09 MED ORDER — PREDNISONE 5 MG PO TABS: 5.0000 mg | ORAL_TABLET | Freq: Two times a day (BID) | ORAL | Status: DC

## 2012-05-09 NOTE — Telephone Encounter (Signed)
[facility] [phone] ?'D THE STATUS OF PT'S ZYTIGA REQUEST CSR-TAMESHIA STATED THEY HAVE TRIED TO CONTACT THE  PT 2X'S PUT HAD NO RESPONSE BACK. SHE SAID THE COPY IS ONLY S6.60. ADVISED HER I WOULD CONTACT PT AND HAVE THEM CALL TO SET UP A DELIVERY TIME

## 2012-07-31 ENCOUNTER — Other Ambulatory Visit (HOSPITAL_COMMUNITY): Payer: Self-pay | Admitting: Oncology

## 2012-07-31 DIAGNOSIS — C61 Malignant neoplasm of prostate: Secondary | ICD-10-CM

## 2012-07-31 MED ORDER — ABIRATERONE ACETATE 250 MG PO TABS: 1000.0000 mg | ORAL_TABLET | Freq: Every day | ORAL | Status: DC

## 2012-07-31 MED ORDER — PREDNISONE 5 MG PO TABS: 5.0000 mg | ORAL_TABLET | Freq: Two times a day (BID) | ORAL | Status: DC

## 2012-08-04 ENCOUNTER — Encounter (HOSPITAL_COMMUNITY): Payer: Self-pay | Admitting: Oncology

## 2012-08-04 ENCOUNTER — Encounter (HOSPITAL_BASED_OUTPATIENT_CLINIC_OR_DEPARTMENT_OTHER): Payer: Medicare Other | Admitting: Oncology

## 2012-08-04 ENCOUNTER — Encounter (HOSPITAL_COMMUNITY): Payer: Medicare Other | Attending: Oncology

## 2012-08-04 VITALS — BP 146/77 | HR 79 | Temp 98.0°F | Resp 16 | Wt 201.0 lb

## 2012-08-04 DIAGNOSIS — I428 Other cardiomyopathies: Secondary | ICD-10-CM | POA: Insufficient documentation

## 2012-08-04 DIAGNOSIS — C61 Malignant neoplasm of prostate: Secondary | ICD-10-CM

## 2012-08-04 DIAGNOSIS — C7919 Secondary malignant neoplasm of other urinary organs: Secondary | ICD-10-CM

## 2012-08-04 DIAGNOSIS — E119 Type 2 diabetes mellitus without complications: Secondary | ICD-10-CM | POA: Insufficient documentation

## 2012-08-04 DIAGNOSIS — I10 Essential (primary) hypertension: Secondary | ICD-10-CM

## 2012-08-04 LAB — DIFFERENTIAL
Basophils Relative: 0 % (ref 0–1)
Eosinophils Absolute: 0.1 10*3/uL (ref 0.0–0.7)
Eosinophils Relative: 1 % (ref 0–5)
Monocytes Absolute: 0.6 10*3/uL (ref 0.1–1.0)
Monocytes Relative: 8 % (ref 3–12)

## 2012-08-04 LAB — COMPREHENSIVE METABOLIC PANEL
Albumin: 3.4 g/dL — ABNORMAL LOW (ref 3.5–5.2)
BUN: 20 mg/dL (ref 6–23)
Calcium: 9.6 mg/dL (ref 8.4–10.5)
Chloride: 103 mEq/L (ref 96–112)
Creatinine, Ser: 1.31 mg/dL (ref 0.50–1.35)
Total Bilirubin: 0.3 mg/dL (ref 0.3–1.2)

## 2012-08-04 LAB — CBC
HCT: 35.4 % — ABNORMAL LOW (ref 39.0–52.0)
Hemoglobin: 11.6 g/dL — ABNORMAL LOW (ref 13.0–17.0)
MCH: 29 pg (ref 26.0–34.0)
MCHC: 32.8 g/dL (ref 30.0–36.0)
MCV: 88.5 fL (ref 78.0–100.0)
RDW: 15.2 % (ref 11.5–15.5)

## 2012-08-04 MED ORDER — PREDNISONE 5 MG PO TABS: 5.0000 mg | ORAL_TABLET | Freq: Two times a day (BID) | ORAL | Status: DC

## 2012-08-04 NOTE — Patient Instructions (Addendum)
[facility]  Discharge Instructions  RECOMMENDATIONS MADE BY THE CONSULTANT AND ANY TEST RESULTS WILL BE SENT TO YOUR REFERRING DOCTOR.   EXAM FINDINGS BY MD TODAY AND SIGNS AND SYMPTOMS TO REPORT TO CLINIC OR PRIMARY MD: Exam and discussion by PA.  You are doing ok for now.  Stop taking the Bicalutamide (Casodex) but continue taking the Abiraterone (zytiga) and the prednisone.  We will check some blood work today.  MEDICATIONS PRESCRIBED: none   INSTRUCTIONS GIVEN AND DISCUSSED: Other :  Report any new lumps, bone pain or shortness of breath.  SPECIAL INSTRUCTIONS/FOLLOW-UP: Lab work Needed today and in 3 months and Return to Clinic in 3 months for follow-up.   I acknowledge that I have been informed and understand all the instructions given to me and received a copy. I do not have any more questions at this time, but understand that I may call the Specialty Clinic at [facility] at [phone] during business hours should I have any further questions or need assistance in obtaining follow-up care.    __________________________________________  _____________  __________ Signature of Patient or Authorized Representative            Date                   Time    __________________________________________ Nurse's Signature

## 2012-08-04 NOTE — Progress Notes (Signed)
Oscar Hickman presented for labwork. Labs per MD order drawn via Peripheral Line 23 gauge needle inserted in right AC  Good blood return present. Procedure without incident.  Needle removed intact. Patient tolerated procedure well.

## 2012-08-04 NOTE — Progress Notes (Signed)
[name], MD [address] [place] [address]  1. ADENOCARCINOMA, PROSTATE     CURRENT THERAPY: Zytiga and Prednisone 5 mg BID starting on 07/06/2012  INTERVAL HISTORY: DANN GALICIA 71 y.o. 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 Indwelling Foley catheter removed. Now on Depo-Lupro and Casodex. 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   S/P Depo-Lupon 22.5 mg every 12 weeks and Casodex daily with failure in therapy which lead to a discontinue in therapy to Zytiga/Prednisone.  Unfortunately, there was a miscommunication error.  Mr. Barstow did receive his Roosvelt Maser, but he also continued the Casodex in addition to the [place].  So we spent some time time today discussing his new Prostate cancer regimen.  I went over all of his medications.  I placed an "X" on the Casodex so he does not take it any longer.    He continues to walk 1/2 mile daily and on Saturdays he walks 1 mile.  He remains active, but I think that is relative.  He definitely has signs of early dementia and his wife is able to support him appropriately.    He denies any complaints today.  He is tolerating the Zytiga and Prednisone well.  I will refill his Prednisone for a 3 month supply.  He has transportation issues and is only able to come to the clinic every 3 months.  Thus, we will continue Zytiga for an additional 3 months and see how his PSA responds.  If there is no response, will discontinue at that time and possibly pursue Xtandi.  Mr. Benninger denies any complaints.  He is tolerating therapy well thus far.  We will get lab work today including PSA.  It is a little early to make any decisions on the effectiveness of the Zytiga since he has been on it for 4 weeks or less.  He denies any bone pain or rectal  discomfort.  He denies any pain, decreased urine flow, pain with urinating, urinary complaints, etc.  Complete ROS questioning is negative.     Past Medical History  Diagnosis Date  . Hypertension   . Prostate ca   . Skin infection     history  . HTN (hypertension) 11/23/2011  . High cholesterol   . Diabetes mellitus   . Heart disease     pt states that he has been told that he has a "bad " heart    has ADENOCARCINOMA, PROSTATE; DIABETES MELLITUS, TYPE II; HYPERLIPIDEMIA; HTN (hypertension); and Cardiomyopathy, nonischemic on his problem list.      has no known allergies.  Mr. Remmel does not currently have medications on file.  Past Surgical History  Procedure Date  . Cardiac catheterization 02/2009  . Transurethral resection of prostate 03/2008    Denies any headaches, dizziness, double vision, fevers, chills, night sweats, nausea, vomiting, diarrhea, constipation, chest pain, heart palpitations, shortness of breath, blood in stool, black tarry stool, urinary pain, urinary burning, urinary frequency, hematuria.   PHYSICAL EXAMINATION  ECOG PERFORMANCE STATUS: 1 - Symptomatic but completely ambulatory  Filed Vitals:   08/04/12 1000  BP: 146/77  Pulse: 79  Temp: 98 F (36.7 C)  Resp: 16    GENERAL:alert, no distress, well nourished, well developed, obese and smiling, wearing a baseball helmet SKIN: skin color, texture, turgor are normal, no rashes  or significant lesions HEAD: Normocephalic, No masses, lesions, tenderness or abnormalities EYES: normal, Conjunctiva are pink and non-injected EARS: External ears normal OROPHARYNX:lips, buccal mucosa, and tongue normal and mucous membranes are moist  NECK: supple, no adenopathy, thyroid normal size, non-tender, without nodularity, no stridor, non-tender, trachea midline LYMPH:  no palpable lymphadenopathy, no hepatosplenomegaly BREAST:not examined LUNGS: clear to auscultation and percussion HEART: regular rate &  rhythm, no murmurs, no gallops, S1 normal and S2 normal ABDOMEN:abdomen soft, non-tender, obese, normal bowel sounds, no masses or organomegaly and no hepatosplenomegaly BACK: Back symmetric, no curvature., No CVA tenderness EXTREMITIES:less then 2 second capillary refill, no joint deformities, effusion, or inflammation, no edema, no skin discoloration, no clubbing, no cyanosis  NEURO: alert & oriented x 3 with fluent speech, no focal motor/sensory deficits, gait normal    LABORATORY DATA: CBC    Component Value Date/Time   WBC 5.3 04/14/2012 1220   RBC 4.14* 04/14/2012 1220   HGB 11.5* 04/14/2012 1220   HCT 35.0* 04/14/2012 1220   PLT 315 04/14/2012 1220   MCV 84.5 04/14/2012 1220   MCH 27.8 04/14/2012 1220   MCHC 32.9 04/14/2012 1220   RDW 15.0 04/14/2012 1220   LYMPHSABS 1.3 04/14/2012 1220   MONOABS 0.5 04/14/2012 1220   EOSABS 0.4 04/14/2012 1220   BASOSABS 0.0 04/14/2012 1220    Lab Results  Component Value Date   PSA 2.76 04/14/2012   PSA 1.63 01/19/2012   PSA 0.55 11/23/2011      PENDING LABS: CBC diff, CMET, PSA    ASSESSMENT:  1. 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 Indwelling Foley catheter removed. Now on Depo-Lupro and Casodex. 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 Zytiga and Prednisone 5 mg BID starting on 07/06/2012 2. 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 DM  4. HTN   PLAN:  1. I personally reviewed and went over laboratory results with the patient. 2. I personally reviewed and went over radiographic studies with the patient. 3. Patient education regarding his prostate cancer medications.  We will discontinue the Casodex and Depo-Lupron.  He will continue with the Zytiga/Prednisone daily. We reviewed all of his medications today. 4. Lab work today: CBC diff, CMET, PSA 5. I e-scribed Prednisone 5 mg BID #60 with 2 refills for a 3 month  supply.  This was sent to [facility] and requested to be delivered. 6. Follow-up in 3 months for follow-up.  At that time, we will check PSA with other lab work due to transportation difficulties.     All questions were answered. The patient knows to call the clinic with any problems, questions or concerns. We can certainly see the patient much sooner if necessary.  The patient and plan discussed with Si Gaul, MD and he is in agreement with the aforementioned.  [name],[name]

## 2012-08-05 LAB — PSA: PSA: 10.56 ng/mL — ABNORMAL HIGH

## 2012-08-09 ENCOUNTER — Other Ambulatory Visit (HOSPITAL_COMMUNITY): Payer: Self-pay | Admitting: Oncology

## 2012-08-30 ENCOUNTER — Other Ambulatory Visit (HOSPITAL_COMMUNITY): Payer: Self-pay | Admitting: Oncology

## 2012-08-30 ENCOUNTER — Telehealth (HOSPITAL_COMMUNITY): Payer: Self-pay | Admitting: Oncology

## 2012-08-30 DIAGNOSIS — C61 Malignant neoplasm of prostate: Secondary | ICD-10-CM

## 2012-08-30 MED ORDER — ABIRATERONE ACETATE 250 MG PO TABS: 1000.0000 mg | ORAL_TABLET | Freq: Every day | ORAL | Status: DC

## 2012-10-09 ENCOUNTER — Telehealth (HOSPITAL_COMMUNITY): Payer: Self-pay | Admitting: Oncology

## 2012-10-09 ENCOUNTER — Other Ambulatory Visit (HOSPITAL_COMMUNITY): Payer: Self-pay | Admitting: Oncology

## 2012-10-09 DIAGNOSIS — C61 Malignant neoplasm of prostate: Secondary | ICD-10-CM

## 2012-10-09 MED ORDER — PREDNISONE 5 MG PO TABS: 5.0000 mg | ORAL_TABLET | Freq: Two times a day (BID) | ORAL | Status: DC

## 2012-10-09 MED ORDER — ABIRATERONE ACETATE 250 MG PO TABS: 1000.0000 mg | ORAL_TABLET | Freq: Every day | ORAL | Status: DC

## 2012-11-06 ENCOUNTER — Ambulatory Visit (HOSPITAL_COMMUNITY): Payer: Medicare Other | Admitting: Oncology

## 2012-11-06 ENCOUNTER — Other Ambulatory Visit (HOSPITAL_COMMUNITY): Payer: Medicare Other

## 2012-11-07 ENCOUNTER — Other Ambulatory Visit (HOSPITAL_COMMUNITY): Payer: Self-pay | Admitting: Oncology

## 2012-11-07 ENCOUNTER — Telehealth (HOSPITAL_COMMUNITY): Payer: Self-pay | Admitting: Oncology

## 2012-11-07 DIAGNOSIS — C61 Malignant neoplasm of prostate: Secondary | ICD-10-CM

## 2012-11-07 MED ORDER — ABIRATERONE ACETATE 250 MG PO TABS: 1000.0000 mg | ORAL_TABLET | Freq: Every day | ORAL | Status: DC

## 2012-11-07 MED ORDER — PREDNISONE 5 MG PO TABS: 5.0000 mg | ORAL_TABLET | Freq: Two times a day (BID) | ORAL | Status: DC

## 2012-11-07 NOTE — Telephone Encounter (Signed)
Rep from [facility] called stating Oscar Hickman needs another rx for Zytiga 250mg  tablets - the patient is out of refills and needs a new prescription.  Fax # for reference: [phone].

## 2012-11-16 ENCOUNTER — Encounter (HOSPITAL_COMMUNITY): Payer: Medicare Other | Attending: Oncology | Admitting: Oncology

## 2012-11-16 ENCOUNTER — Encounter (HOSPITAL_COMMUNITY): Payer: Self-pay | Admitting: Oncology

## 2012-11-16 ENCOUNTER — Encounter (HOSPITAL_COMMUNITY): Payer: Medicare Other

## 2012-11-16 VITALS — BP 164/90 | HR 93 | Temp 98.0°F | Resp 20 | Wt 208.4 lb

## 2012-11-16 DIAGNOSIS — E119 Type 2 diabetes mellitus without complications: Secondary | ICD-10-CM | POA: Insufficient documentation

## 2012-11-16 DIAGNOSIS — I10 Essential (primary) hypertension: Secondary | ICD-10-CM | POA: Insufficient documentation

## 2012-11-16 DIAGNOSIS — I428 Other cardiomyopathies: Secondary | ICD-10-CM | POA: Insufficient documentation

## 2012-11-16 DIAGNOSIS — R058 Other specified cough: Secondary | ICD-10-CM

## 2012-11-16 DIAGNOSIS — C61 Malignant neoplasm of prostate: Secondary | ICD-10-CM

## 2012-11-16 DIAGNOSIS — J069 Acute upper respiratory infection, unspecified: Secondary | ICD-10-CM | POA: Insufficient documentation

## 2012-11-16 DIAGNOSIS — C7919 Secondary malignant neoplasm of other urinary organs: Secondary | ICD-10-CM

## 2012-11-16 DIAGNOSIS — R05 Cough: Secondary | ICD-10-CM

## 2012-11-16 DIAGNOSIS — F0390 Unspecified dementia without behavioral disturbance: Secondary | ICD-10-CM | POA: Insufficient documentation

## 2012-11-16 LAB — CBC WITH DIFFERENTIAL/PLATELET
Basophils Absolute: 0 10*3/uL (ref 0.0–0.1)
Basophils Relative: 0 % (ref 0–1)
Eosinophils Relative: 2 % (ref 0–5)
HCT: 36 % — ABNORMAL LOW (ref 39.0–52.0)
MCHC: 32.2 g/dL (ref 30.0–36.0)
MCV: 86.1 fL (ref 78.0–100.0)
Monocytes Absolute: 0.3 10*3/uL (ref 0.1–1.0)
Platelets: 344 10*3/uL (ref 150–400)
RDW: 16.1 % — ABNORMAL HIGH (ref 11.5–15.5)

## 2012-11-16 LAB — COMPREHENSIVE METABOLIC PANEL
AST: 15 U/L (ref 0–37)
Albumin: 3.3 g/dL — ABNORMAL LOW (ref 3.5–5.2)
Alkaline Phosphatase: 123 U/L — ABNORMAL HIGH (ref 39–117)
BUN: 23 mg/dL (ref 6–23)
CO2: 26 mEq/L (ref 19–32)
Chloride: 101 mEq/L (ref 96–112)
Creatinine, Ser: 0.89 mg/dL (ref 0.50–1.35)
GFR calc non Af Amer: 85 mL/min — ABNORMAL LOW (ref >90)
Potassium: 3.6 mEq/L (ref 3.5–5.1)
Total Bilirubin: 0.3 mg/dL (ref 0.3–1.2)

## 2012-11-16 MED ORDER — AZITHROMYCIN 250 MG PO TABS: ORAL_TABLET | ORAL | Status: DC

## 2012-11-16 NOTE — Progress Notes (Signed)
Oscar Hickman presented for labwork. Labs per MD order drawn via Peripheral Line 25 gauge needle inserted in rt ac.  Good blood return present. Procedure without incident.  Needle removed intact. Patient tolerated procedure well.

## 2012-11-16 NOTE — Progress Notes (Signed)
Oscar Stalling, MD [address] [place] [address]  1. ADENOCARCINOMA, PROSTATE  PSA, CBC with Differential, CBC with Differential, CBC with Differential, Comprehensive metabolic panel, PSA  2. Sputum production  azithromycin (ZITHROMAX) 250 MG tablet    CURRENT THERAPY: Zytiga/Prednisone daily starting on 07/06/2012.  INTERVAL HISTORY: Oscar Hickman 72 y.o. 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 Indwelling Foley catheter removed. S/P Depo-Lupro and Casodex. 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 S/P Depo-Lupon 22.5 mg every 12 weeks and Casodex daily with failure in therapy which lead to a discontinuation of this therapy to switched to  Zytiga/Prednisone starting on 07/06/2012.  Oscar Hickman is doing well.  He reports that he has a "cold."  This began greater than 7 days ago.  His wife was sick too, but she has recovered completely.  He has yet to get better 100%.  He denies any sore throat, fevers, shaking chills, sputum production, and sinus tenderness.   He does admit to nasal congestion and discharge of yellow phlegm.  So I will give him a Z-Pak.  PE shows a posterior pharynx that is erythematous.   Oscar Hickman is otherwise doing well.  I personally reviewed and went over laboratory results with the patient.  He is compliant with his Zytiga/Prednisone.  Labs were drawn today.  His PSA will be reported tomorrow.    I informed the patient and his wife that if his PSA continues to climb, we will switch to [place], pending approval from his insurance.  Otherwise, complete ROS questioning is negative.    Past Medical History  Diagnosis Date  . Hypertension   . Prostate ca   . Skin infection     history  . HTN (hypertension) 11/23/2011  . High cholesterol   . Diabetes mellitus   .  Heart disease     pt states that he has been told that he has a "bad " heart    has ADENOCARCINOMA, PROSTATE; DIABETES MELLITUS, TYPE II; HYPERLIPIDEMIA; HTN (hypertension); and Cardiomyopathy, nonischemic on his problem list.      has no known allergies.  Oscar Hickman does not currently have medications on file.  Past Surgical History  Procedure Date  . Cardiac catheterization 02/2009  . Transurethral resection of prostate 03/2008    Denies any headaches, dizziness, double vision, fevers, chills, night sweats, nausea, vomiting, diarrhea, constipation, chest pain, heart palpitations, shortness of breath, blood in stool, black tarry stool, urinary pain, urinary burning, urinary frequency, hematuria.   PHYSICAL EXAMINATION  ECOG PERFORMANCE STATUS: 1 - Symptomatic but completely ambulatory  Filed Vitals:   11/16/12 1403  BP: 164/90  Pulse: 93  Temp: 98 F (36.7 C)  Resp: 20    GENERAL:alert, no distress, well nourished, well developed, comfortable, cooperative, obese and smiling, mild dementia SKIN: skin color, texture, turgor are normal, no rashes or significant lesions HEAD: Normocephalic, No masses, lesions, tenderness or abnormalities EYES: normal, Conjunctiva are pink and non-injected EARS: External ears normal OROPHARYNX:mucous membranes are moist  Posterior pharynx erythema. NECK: supple, no adenopathy, trachea midline LYMPH:  no palpable lymphadenopathy BREAST:not examined LUNGS: clear to auscultation and percussion HEART: regular rate & rhythm, no murmurs, no gallops, S1 normal and S2 normal ABDOMEN:abdomen soft, non-tender, obese and normal bowel sounds BACK: Back symmetric, no curvature., No CVA tenderness EXTREMITIES:less then 2 second capillary refill,  no joint deformities, effusion, or inflammation, no edema, no skin discoloration, no clubbing, no cyanosis  NEURO: alert & oriented x 3 with fluent speech, no focal motor/sensory deficits, gait normal. Mild  dementia.   LABORATORY DATA: CBC    Component Value Date/Time   WBC 8.0 08/04/2012 1103   RBC 4.00* 08/04/2012 1103   HGB 11.6* 08/04/2012 1103   HCT 35.4* 08/04/2012 1103   PLT 306 08/04/2012 1103   MCV 88.5 08/04/2012 1103   MCH 29.0 08/04/2012 1103   MCHC 32.8 08/04/2012 1103   RDW 15.2 08/04/2012 1103   LYMPHSABS 0.8 08/04/2012 1103   MONOABS 0.6 08/04/2012 1103   EOSABS 0.1 08/04/2012 1103   BASOSABS 0.0 08/04/2012 1103      Chemistry      Component Value Date/Time   NA 138 08/04/2012 1103   K 3.9 08/04/2012 1103   CL 103 08/04/2012 1103   CO2 29 08/04/2012 1103   BUN 20 08/04/2012 1103   CREATININE 1.31 08/04/2012 1103      Component Value Date/Time   CALCIUM 9.6 08/04/2012 1103   ALKPHOS 100 08/04/2012 1103   AST 14 08/04/2012 1103   ALT 10 08/04/2012 1103   BILITOT 0.3 08/04/2012 1103     Lab Results  Component Value Date   PSA 10.56* 08/04/2012   PSA 2.76 04/14/2012   PSA 1.63 01/19/2012      ASSESSMENT:  1. 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 Indwelling Foley catheter removed. S/P Depo-Lupro and Casodex. 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  Zytiga and Prednisone 5 mg BID starting on 07/06/2012  2. 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 DM  4. HTN 5. Mild dementia 6. URI   PLAN:  1. I personally reviewed and went over laboratory results with the patient. 2. Labs today: CBC diff, CMET, PSA 3. Will switch to [place] approval, if PSA continues to climb.   4. Discussed the risks, benefits, alternatives, and side effects of Xtandi if needed in the future.  5. Due to transportation issues, will perform lab work the day he is seen in follow-up: CBC diff, CMET, PSA 6. Z-Pak, escribed to [facility].  Will ask for that to be delivered.   7. Return in 8-12 weeks for follow-up.    All questions were answered. The  patient knows to call the clinic with any problems, questions or concerns. We can certainly see the patient much sooner if necessary.  Patient and plan will be discussed with Dr. Mariel Sleet in the near future.  Vanessia Bokhari

## 2012-11-16 NOTE — Patient Instructions (Addendum)
.  [facility] Discharge Instructions  RECOMMENDATIONS MADE BY THE CONSULTANT AND ANY TEST RESULTS WILL BE SENT TO YOUR REFERRING PHYSICIAN.  EXAM FINDINGS BY THE PHYSICIAN TODAY AND SIGNS OR SYMPTOMS TO REPORT TO CLINIC OR PRIMARY PHYSICIAN:   MEDICATIONS PRESCRIBED:  Will call in medication for your cold- a zpack Continue Zytega and prednisone We will call you about cancer marker. INSTRUCTIONS GIVEN AND DISCUSSED: Labs in 8-10 weeks and see [place] back   Thank you for choosing [facility] to provide your oncology and hematology care.  To afford each patient quality time with our providers, please arrive at least 15 minutes before your scheduled appointment time.  With your help, our goal is to use those 15 minutes to complete the necessary work-up to ensure our physicians have the information they need to help with your evaluation and healthcare recommendations.    Effective January 1st, 2014, we ask that you re-schedule your appointment with our physicians should you arrive 10 or more minutes late for your appointment.  We strive to give you quality time with our providers, and arriving late affects you and other patients whose appointments are after yours.    Again, thank you for choosing [facility].  Our hope is that these requests will decrease the amount of time that you wait before being seen by our physicians.       _____________________________________________________________  Should you have questions after your visit to [facility], please contact our office at [phone] between the hours of 8:30 a.m. and 5:00 p.m.  Voicemails left after 4:30 p.m. will not be returned until the following business day.  For prescription refill requests, have your pharmacy contact our office with your prescription refill request.

## 2012-11-17 LAB — PSA: PSA: 3.37 ng/mL (ref <=4.00)

## 2012-11-22 ENCOUNTER — Encounter (HOSPITAL_COMMUNITY): Payer: Self-pay | Admitting: *Deleted

## 2012-11-22 ENCOUNTER — Emergency Department (HOSPITAL_COMMUNITY): Payer: Medicare Other

## 2012-11-22 ENCOUNTER — Emergency Department (HOSPITAL_COMMUNITY)
Admission: EM | Admit: 2012-11-22 | Discharge: 2012-11-22 | Disposition: A | Discharge status: Home / Self Care | Payer: Medicare Other | Attending: Emergency Medicine | Admitting: Emergency Medicine

## 2012-11-22 DIAGNOSIS — Z7982 Long term (current) use of aspirin: Secondary | ICD-10-CM | POA: Insufficient documentation

## 2012-11-22 DIAGNOSIS — Z872 Personal history of diseases of the skin and subcutaneous tissue: Secondary | ICD-10-CM | POA: Insufficient documentation

## 2012-11-22 DIAGNOSIS — Z794 Long term (current) use of insulin: Secondary | ICD-10-CM | POA: Insufficient documentation

## 2012-11-22 DIAGNOSIS — R0602 Shortness of breath: Secondary | ICD-10-CM | POA: Insufficient documentation

## 2012-11-22 DIAGNOSIS — Z8679 Personal history of other diseases of the circulatory system: Secondary | ICD-10-CM | POA: Insufficient documentation

## 2012-11-22 DIAGNOSIS — J069 Acute upper respiratory infection, unspecified: Secondary | ICD-10-CM | POA: Insufficient documentation

## 2012-11-22 DIAGNOSIS — E7800 Pure hypercholesterolemia, unspecified: Secondary | ICD-10-CM | POA: Insufficient documentation

## 2012-11-22 DIAGNOSIS — R059 Cough, unspecified: Secondary | ICD-10-CM | POA: Insufficient documentation

## 2012-11-22 DIAGNOSIS — Z8546 Personal history of malignant neoplasm of prostate: Secondary | ICD-10-CM | POA: Insufficient documentation

## 2012-11-22 DIAGNOSIS — I10 Essential (primary) hypertension: Secondary | ICD-10-CM

## 2012-11-22 DIAGNOSIS — E119 Type 2 diabetes mellitus without complications: Secondary | ICD-10-CM | POA: Insufficient documentation

## 2012-11-22 DIAGNOSIS — Z79899 Other long term (current) drug therapy: Secondary | ICD-10-CM | POA: Insufficient documentation

## 2012-11-22 DIAGNOSIS — R05 Cough: Secondary | ICD-10-CM | POA: Insufficient documentation

## 2012-11-22 MED ORDER — SODIUM CHLORIDE 0.9 % IN NEBU
INHALATION_SOLUTION | RESPIRATORY_TRACT | Status: AC
  Administered 2012-11-22: 3 mL
  Filled 2012-11-22: qty 3

## 2012-11-22 MED ORDER — ALBUTEROL SULFATE (5 MG/ML) 0.5% IN NEBU
2.5000 mg | INHALATION_SOLUTION | Freq: Once | RESPIRATORY_TRACT | Status: AC
  Administered 2012-11-22: 2.5 mg via RESPIRATORY_TRACT
  Filled 2012-11-22: qty 0.5

## 2012-11-22 NOTE — ED Notes (Signed)
States he feels "a little better". resp effort seems unchanged since admission

## 2012-11-22 NOTE — ED Notes (Signed)
Pt reports "having a cold" for several weeks.  Reports he did see his oncologist last week and was prescribed an antibiotic. Pt reports he completed course today.  Reporting thick sputum.

## 2012-11-22 NOTE — ED Provider Notes (Signed)
History     CSN: [id number]  Arrival date & time 11/22/12  0145   First MD Initiated Contact with Patient 11/22/12 0205      Chief Complaint  Patient presents with  . Shortness of Breath    (Consider location/radiation/quality/duration/timing/severity/associated sxs/prior treatment) HPI [name] is a 72 y.o. male  With a h/o prostate cancer, HTN, DM who presents to the Emergency Department complaining of shortness of breath. He has just completed a Z pack for URI symptoms. He got up to use the bathroom and felt it was hard to catch his breath. He has a dry cough. Denies fever, chills, chest pain.  PCP Dr. Janna Arch Oncologist Dr. Mariel Sleet Past Medical History  Diagnosis Date  . Hypertension   . Prostate ca   . Skin infection     history  . HTN (hypertension) 11/23/2011  . High cholesterol   . Diabetes mellitus   . Heart disease     pt states that he has been told that he has a "bad " heart    Past Surgical History  Procedure Date  . Cardiac catheterization 02/2009  . Transurethral resection of prostate 03/2008    Family History  Problem Relation Age of Onset  . Cancer Mother   . Cancer Father     History  Substance Use Topics  . Smoking status: Never Smoker   . Smokeless tobacco: Never Used  . Alcohol Use: No      Review of Systems  Constitutional: Negative for fever.       10 Systems reviewed and are negative for acute change except as noted in the HPI.  HENT: Negative for congestion.   Eyes: Negative for discharge and redness.  Respiratory: Positive for cough and shortness of breath.   Cardiovascular: Negative for chest pain.  Gastrointestinal: Negative for vomiting and abdominal pain.  Musculoskeletal: Negative for back pain.  Skin: Negative for rash.  Neurological: Negative for syncope, numbness and headaches.  Psychiatric/Behavioral:       No behavior change.    Allergies  Review of patient's allergies indicates no known  allergies.  Home Medications   Current Outpatient Rx  Name  Route  Sig  Dispense  Refill  . ABIRATERONE ACETATE 250 MG PO TABS   Oral   Take 4 tablets (1,000 mg total) by mouth daily. Take on an empty stomach 1 hour before or 2 hours after a meal   120 tablet   0   . AMLODIPINE BESYLATE 10 MG PO TABS   Oral   Take 10 mg by mouth daily.         . ASPIRIN EC 81 MG PO TBEC   Oral   Take 81 mg by mouth daily.         . AZITHROMYCIN 250 MG PO TABS      Take 2 tabs PO today and then 1 daily thereafter until complete   6 each   0     Please deliver   . CARVEDILOL 25 MG PO TABS   Oral   Take 25 mg by mouth 2 (two) times daily with a meal.         . CLONIDINE HCL 0.3 MG PO TABS   Oral   Take 1 tablet (0.3 mg total) by mouth 3 (three) times daily.   90 tablet   2   . HYDRALAZINE HCL 50 MG PO TABS   Oral   Take 50 mg by mouth 3 (three) times  daily.         . INSULIN GLARGINE 100 UNIT/ML [place] SOLN   Subcutaneous   Inject 24 Units into the skin daily.          . ISOSORBIDE DINITRATE 20 MG PO TABS   Oral   Take 20 mg by mouth 3 (three) times daily.          Marland Kitchen METFORMIN HCL 500 MG PO TABS   Oral   Take 500 mg by mouth 2 (two) times daily with a meal.           . QUINAPRIL HCL 40 MG PO TABS   Oral   Take 40 mg by mouth daily.           Marland Kitchen TAMSULOSIN HCL 0.4 MG PO CAPS   Oral   Take 1 capsule (0.4 mg total) by mouth daily.   30 capsule   2     BP 193/108  Pulse 88  Temp 98 F (36.7 C) (Oral)  Resp 20  Ht 5\' 4"  (1.626 m)  Wt 200 lb (90.719 kg)  BMI 34.33 kg/m2  SpO2 97%  Physical Exam  Nursing note and vitals reviewed. Constitutional:       Awake, alert, nontoxic appearance.  HENT:  Head: Atraumatic.  Eyes: Right eye exhibits no discharge. Left eye exhibits no discharge.  Neck: Neck supple.  Cardiovascular: Normal rate.   Pulmonary/Chest: Effort normal and breath sounds normal. He exhibits no tenderness.  Abdominal: Soft. There is no  tenderness. There is no rebound.  Musculoskeletal: He exhibits no tenderness.       Baseline ROM, no obvious new focal weakness.  Neurological:       Mental status and motor strength appears baseline for patient and situation.  Skin: No rash noted.  Psychiatric: He has a normal mood and affect.    ED Course  Procedures (including critical care time)   Date: 11/22/2012   0159  Rate: 89  Rhythm: normal sinus rhythm and with fusion complexes  QRS Axis: normal  Intervals: normal  ST/T Wave abnormalities: normal  Conduction Disutrbances:left anterior fascicular block  Narrative Interpretation:   Old EKG Reviewed: none available  Dg Chest 2 View  11/22/2012  *RADIOLOGY REPORT*  Clinical Data: Shortness of breath and cough for 3 weeks.  Recently finish antibiotics.  CHEST - 2 VIEW  Comparison: 02/25/2012  Findings: Cardiac enlargement with prominent central pulmonary vascularity suggesting arterial hypertension.  No focal airspace disease in the lungs.  No blunting of costophrenic angles.  No pneumothorax.  Calcified and tortuous aorta.  Emphysematous changes in the lungs.  Degenerative changes in the spine.  No significant change since previous study.  IMPRESSION: Cardiac enlargement.  Prominent central pulmonary vascularity suggesting arterial hypertension.  No evidence of active disease.   Original Report Authenticated By: Burman Nieves, M.D.     MDM  Patient presents with shortness of breath. Exam was unremarkable. Chest xray without acute findings. Patient given a nebulizer treatment with improvement. O2 sats remained 97-98% on RA.Reviewed results with patient and granddaughter.  Pt feels improved after observation and/or treatment in ED.Pt stable in ED with no significant deterioration in condition.The patient appears reasonably screened and/or stabilized for discharge and I doubt any other medical condition or other [facility] requiring further screening, evaluation, or treatment in the ED at  this time prior to discharge.  MDM Reviewed: nursing note, vitals and previous chart Reviewed previous: labs Interpretation: x-ray  Nicoletta Dress. Colon Branch, MD 11/22/12 [phone]

## 2012-11-22 NOTE — ED Notes (Signed)
Discharge instructions given and reviewed with patient.  Patient verbalized understanding to follow up with his regular MD.  Patient refused wheelchair to be discharged.  Patient ambulatory; discharged home in good condition.  Family accompanied discharge.

## 2012-12-06 ENCOUNTER — Other Ambulatory Visit (HOSPITAL_COMMUNITY): Payer: Self-pay | Admitting: Oncology

## 2012-12-06 DIAGNOSIS — C61 Malignant neoplasm of prostate: Secondary | ICD-10-CM

## 2012-12-06 MED ORDER — ABIRATERONE ACETATE 250 MG PO TABS: 1000.0000 mg | ORAL_TABLET | Freq: Every day | ORAL | Status: DC

## 2012-12-06 MED ORDER — PREDNISONE 5 MG PO TABS: 5.0000 mg | ORAL_TABLET | Freq: Two times a day (BID) | ORAL | Status: DC

## 2013-01-04 ENCOUNTER — Other Ambulatory Visit (HOSPITAL_COMMUNITY): Payer: Self-pay | Admitting: Oncology

## 2013-01-04 DIAGNOSIS — C61 Malignant neoplasm of prostate: Secondary | ICD-10-CM

## 2013-01-04 MED ORDER — PREDNISONE 5 MG PO TABS: 5.0000 mg | ORAL_TABLET | Freq: Two times a day (BID) | ORAL | Status: DC

## 2013-01-10 ENCOUNTER — Ambulatory Visit (HOSPITAL_COMMUNITY): Payer: Medicare Other | Admitting: Oncology

## 2013-01-10 ENCOUNTER — Other Ambulatory Visit (HOSPITAL_COMMUNITY): Payer: Medicare Other

## 2013-01-11 ENCOUNTER — Other Ambulatory Visit (HOSPITAL_COMMUNITY): Payer: Medicare Other

## 2013-01-11 ENCOUNTER — Ambulatory Visit (HOSPITAL_COMMUNITY): Payer: Medicare Other | Admitting: Oncology

## 2013-01-16 NOTE — Progress Notes (Signed)
Oscar Stalling, MD [address] [place] [address]  ADENOCARCINOMA, PROSTATE  CURRENT THERAPY: Zytiga/Prednisone daily starting on 07/06/2012.   INTERVAL HISTORY: EARSEL SHOUSE 72 y.o. 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 Indwelling Foley catheter removed. S/P Depo-Lupro and Casodex. 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 S/P Depo-Lupon 22.5 mg every 12 weeks and Casodex daily with failure in therapy which lead to a discontinuation of this therapy to switched to Zytiga/Prednisone starting on 07/06/2012.  Labs were drawn today and we will see what his PSA is tomorrow.  If it is climbing, we will switch him to [place] and D/C prednisone.  He is tolerating the Zytiga/Prednisone well.  He denies any complaints associated with this medication.  He denies any complaints and he reports that today is a good day.  He admits that he has many more good days than bad days.    He reports some B/L feet peripheral neuropathy which he and his wife report have been present for years.  He is a diabetic and is on insulin injections.  This is managed by his PCP.  He denies checking his glucose at home because he refuses.  He denies any increased difficulty walking.  He denies any lose of bowel or bladder control.  I personally reviewed and went over laboratory results with the patient.  We will continue to monitor his PSA.  He has transportation issues so I think the most we can check his PSA is every 3 months or so when he is here for follow-up.  Oncologically, he denies any complaints and ROS questioning is negative.     Past Medical History  Diagnosis Date  . Hypertension   . Prostate ca   . Skin infection     history  . HTN (hypertension) 11/23/2011  . High cholesterol   . Diabetes  mellitus   . Heart disease     pt states that he has been told that he has a "bad " heart    has ADENOCARCINOMA, PROSTATE; DIABETES MELLITUS, TYPE II; HYPERLIPIDEMIA; HTN (hypertension); and Cardiomyopathy, nonischemic on his problem list.     has No Known Allergies.  Mr. Keelin had no medications administered during this visit.  Past Surgical History  Procedure Laterality Date  . Cardiac catheterization  02/2009  . Transurethral resection of prostate  03/2008    Denies any headaches, dizziness, double vision, fevers, chills, night sweats, nausea, vomiting, diarrhea, constipation, chest pain, heart palpitations, shortness of breath, blood in stool, black tarry stool, urinary pain, urinary burning, urinary frequency, hematuria.   PHYSICAL EXAMINATION  ECOG PERFORMANCE STATUS: 1 - Symptomatic but completely ambulatory  Filed Vitals:   01/17/13 1100  BP: 157/86  Pulse: 86  Temp: 98.1 F (36.7 C)  Resp: 20    GENERAL:alert, no distress, well nourished, well developed, comfortable, cooperative, obese and smiling, mild dementia  SKIN: skin color, texture, turgor are normal, no rashes or significant lesions  HEAD: Normocephalic, No masses, lesions, tenderness or abnormalities  EYES: normal, Conjunctiva are pink and non-injected  EARS: External ears normal  OROPHARYNX:mucous membranes are moist  NECK: supple, no adenopathy, trachea midline  LYMPH: no palpable lymphadenopathy  BREAST:not examined  LUNGS: clear to auscultation and percussion  HEART: regular rate & rhythm, no murmurs, no gallops, S1 normal and S2 normal  ABDOMEN:abdomen soft,  non-tender, obese and normal bowel sounds, no hepatosplenomegaly. BACK: Back symmetric, no curvature., No CVA tenderness  EXTREMITIES:less then 2 second capillary refill, no joint deformities, effusion, or inflammation, no edema, no skin discoloration, no clubbing, no cyanosis  NEURO: alert & oriented x 3 with fluent speech, no focal  motor/sensory deficits, gait normal. Mild dementia.     LABORATORY DATA: CBC    Component Value Date/Time   WBC 6.0 11/16/2012 1525   RBC 4.18* 11/16/2012 1525   HGB 11.6* 11/16/2012 1525   HCT 36.0* 11/16/2012 1525   PLT 344 11/16/2012 1525   MCV 86.1 11/16/2012 1525   MCH 27.8 11/16/2012 1525   MCHC 32.2 11/16/2012 1525   RDW 16.1* 11/16/2012 1525   LYMPHSABS 0.8 11/16/2012 1525   MONOABS 0.3 11/16/2012 1525   EOSABS 0.1 11/16/2012 1525   BASOSABS 0.0 11/16/2012 1525      Chemistry      Component Value Date/Time   NA 138 11/16/2012 1358   K 3.6 11/16/2012 1358   CL 101 11/16/2012 1358   CO2 26 11/16/2012 1358   BUN 23 11/16/2012 1358   CREATININE 0.89 11/16/2012 1358      Component Value Date/Time   CALCIUM 9.6 11/16/2012 1358   ALKPHOS 123* 11/16/2012 1358   AST 15 11/16/2012 1358   ALT 10 11/16/2012 1358   BILITOT 0.3 11/16/2012 1358     Lab Results  Component Value Date   PSA 3.37 11/16/2012   PSA 10.56* 08/04/2012   PSA 2.76 04/14/2012      RADIOGRAPHIC STUDIES:  11/22/2012  *RADIOLOGY REPORT*  Clinical Data: Shortness of breath and cough for 3 weeks. Recently  finish antibiotics.  CHEST - 2 VIEW  Comparison: 02/25/2012  Findings: Cardiac enlargement with prominent central pulmonary  vascularity suggesting arterial hypertension. No focal airspace  disease in the lungs. No blunting of costophrenic angles. No  pneumothorax. Calcified and tortuous aorta. Emphysematous changes  in the lungs. Degenerative changes in the spine. No significant  change since previous study.  IMPRESSION:  Cardiac enlargement. Prominent central pulmonary vascularity  suggesting arterial hypertension. No evidence of active disease.  Original Report Authenticated By: Burman Nieves, M.D.    ASSESSMENT:  1. 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 Indwelling Foley catheter removed. S/P Depo-Lupro and Casodex. Gleason grade 9 poorly  differentiated adenocarcinoma of prostate S/P TURP followed by radiation in 2009. Zytiga and Prednisone 5 mg BID starting on 07/06/2012  2. 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 DM  4. HTN  5. Mild dementia 6. Xray evidence of arterial hypertension of chest x-ray on 11/22/2012, will defer to PCP.   PLAN:  1. I personally reviewed and went over laboratory results with the patient.  2. Labs today: CBC diff, CMET, PSA  3. Will switch to [place] approval, if PSA continues to climb.  4. Discussed the risks, benefits, alternatives, and side effects of Xtandi if needed in the future.  5. Due to transportation issues, will perform lab work the day he is seen in follow-up: CBC diff, CMET, PSA  6. Return in 8-12 weeks for follow-up.    All questions were answered. The patient knows to call the clinic with any problems, questions or concerns. We can certainly see the patient much sooner if necessary.  The patient and plan discussed with Glenford Peers, MD and he is in agreement with the aforementioned.  [name],[name]

## 2013-01-17 ENCOUNTER — Encounter (HOSPITAL_COMMUNITY): Payer: Self-pay | Admitting: Oncology

## 2013-01-17 ENCOUNTER — Other Ambulatory Visit (HOSPITAL_COMMUNITY): Payer: Self-pay | Admitting: Oncology

## 2013-01-17 ENCOUNTER — Encounter (HOSPITAL_BASED_OUTPATIENT_CLINIC_OR_DEPARTMENT_OTHER): Payer: Medicare Other

## 2013-01-17 ENCOUNTER — Encounter (HOSPITAL_COMMUNITY): Payer: Medicare Other | Attending: Oncology | Admitting: Oncology

## 2013-01-17 VITALS — BP 157/86 | HR 86 | Temp 98.1°F | Resp 20 | Wt 206.4 lb

## 2013-01-17 DIAGNOSIS — E119 Type 2 diabetes mellitus without complications: Secondary | ICD-10-CM

## 2013-01-17 DIAGNOSIS — E876 Hypokalemia: Secondary | ICD-10-CM

## 2013-01-17 DIAGNOSIS — F0390 Unspecified dementia without behavioral disturbance: Secondary | ICD-10-CM

## 2013-01-17 DIAGNOSIS — C61 Malignant neoplasm of prostate: Secondary | ICD-10-CM | POA: Insufficient documentation

## 2013-01-17 DIAGNOSIS — C7952 Secondary malignant neoplasm of bone marrow: Secondary | ICD-10-CM

## 2013-01-17 LAB — CBC WITH DIFFERENTIAL/PLATELET
Basophils Absolute: 0 10*3/uL (ref 0.0–0.1)
Basophils Relative: 0 % (ref 0–1)
HCT: 35.9 % — ABNORMAL LOW (ref 39.0–52.0)
Hemoglobin: 11.5 g/dL — ABNORMAL LOW (ref 13.0–17.0)
Lymphocytes Relative: 11 % — ABNORMAL LOW (ref 12–46)
MCHC: 32 g/dL (ref 30.0–36.0)
Monocytes Absolute: 0.5 10*3/uL (ref 0.1–1.0)
Monocytes Relative: 8 % (ref 3–12)
Neutro Abs: 5.3 10*3/uL (ref 1.7–7.7)
Neutrophils Relative %: 79 % — ABNORMAL HIGH (ref 43–77)
WBC: 6.8 10*3/uL (ref 4.0–10.5)

## 2013-01-17 LAB — COMPREHENSIVE METABOLIC PANEL
AST: 9 U/L (ref 0–37)
Albumin: 3.1 g/dL — ABNORMAL LOW (ref 3.5–5.2)
Alkaline Phosphatase: 101 U/L (ref 39–117)
BUN: 22 mg/dL (ref 6–23)
CO2: 28 mEq/L (ref 19–32)
Chloride: 103 mEq/L (ref 96–112)
Creatinine, Ser: 1.2 mg/dL (ref 0.50–1.35)
GFR calc non Af Amer: 60 mL/min — ABNORMAL LOW (ref >90)
Potassium: 3.4 mEq/L — ABNORMAL LOW (ref 3.5–5.1)
Total Bilirubin: 0.4 mg/dL (ref 0.3–1.2)

## 2013-01-17 MED ORDER — POTASSIUM CHLORIDE ER 10 MEQ PO TBCR: 10.0000 meq | EXTENDED_RELEASE_TABLET | Freq: Every day | ORAL | Status: DC

## 2013-01-17 NOTE — Patient Instructions (Addendum)
[facility] Discharge Instructions  RECOMMENDATIONS MADE BY THE CONSULTANT AND ANY TEST RESULTS WILL BE SENT TO YOUR REFERRING PHYSICIAN.  Return to clinic in 12 weeks to see physician. We will do lab work on the same day as you see the doctor. Report any issues/concerns to clinic as needed. #[phone]  Thank you for choosing [facility] to provide your oncology and hematology care.  To afford each patient quality time with our providers, please arrive at least 15 minutes before your scheduled appointment time.  With your help, our goal is to use those 15 minutes to complete the necessary work-up to ensure our physicians have the information they need to help with your evaluation and healthcare recommendations.    Effective January 1st, 2014, we ask that you re-schedule your appointment with our physicians should you arrive 10 or more minutes late for your appointment.  We strive to give you quality time with our providers, and arriving late affects you and other patients whose appointments are after yours.    Again, thank you for choosing [facility].  Our hope is that these requests will decrease the amount of time that you wait before being seen by our physicians.       _____________________________________________________________  Should you have questions after your visit to [facility], please contact our office at [phone] between the hours of 8:30 a.m. and 5:00 p.m.  Voicemails left after 4:30 p.m. will not be returned until the following business day.  For prescription refill requests, have your pharmacy contact our office with your prescription refill request.

## 2013-01-17 NOTE — Progress Notes (Signed)
Labs drawn today for cbc/diff,cmp,psa 

## 2013-02-02 ENCOUNTER — Other Ambulatory Visit (HOSPITAL_COMMUNITY): Payer: Self-pay | Admitting: Oncology

## 2013-02-02 ENCOUNTER — Telehealth (HOSPITAL_COMMUNITY): Payer: Self-pay | Admitting: Oncology

## 2013-02-02 DIAGNOSIS — C61 Malignant neoplasm of prostate: Secondary | ICD-10-CM

## 2013-02-02 MED ORDER — ABIRATERONE ACETATE 250 MG PO TABS: 1000.0000 mg | ORAL_TABLET | Freq: Every day | ORAL | Status: DC

## 2013-03-09 ENCOUNTER — Other Ambulatory Visit (HOSPITAL_COMMUNITY): Payer: Self-pay | Admitting: Oncology

## 2013-03-09 DIAGNOSIS — C61 Malignant neoplasm of prostate: Secondary | ICD-10-CM

## 2013-03-09 MED ORDER — PREDNISONE 5 MG PO TABS: 5.0000 mg | ORAL_TABLET | Freq: Two times a day (BID) | ORAL | Status: DC

## 2013-04-05 ENCOUNTER — Inpatient Hospital Stay (HOSPITAL_COMMUNITY)
Admission: EM | Admit: 2013-04-05 | Discharge: 2013-04-09 | DRG: 690 | Disposition: A | Discharge status: Home Health | Payer: Medicare Other | Attending: Family Medicine | Admitting: Family Medicine

## 2013-04-05 ENCOUNTER — Encounter (HOSPITAL_COMMUNITY): Payer: Self-pay

## 2013-04-05 ENCOUNTER — Other Ambulatory Visit (HOSPITAL_COMMUNITY): Payer: Self-pay | Admitting: Oncology

## 2013-04-05 ENCOUNTER — Emergency Department (HOSPITAL_COMMUNITY): Payer: Medicare Other

## 2013-04-05 DIAGNOSIS — F411 Generalized anxiety disorder: Secondary | ICD-10-CM | POA: Diagnosis present

## 2013-04-05 DIAGNOSIS — J449 Chronic obstructive pulmonary disease, unspecified: Secondary | ICD-10-CM | POA: Diagnosis present

## 2013-04-05 DIAGNOSIS — I509 Heart failure, unspecified: Secondary | ICD-10-CM | POA: Diagnosis present

## 2013-04-05 DIAGNOSIS — E118 Type 2 diabetes mellitus with unspecified complications: Secondary | ICD-10-CM | POA: Diagnosis present

## 2013-04-05 DIAGNOSIS — I428 Other cardiomyopathies: Secondary | ICD-10-CM | POA: Diagnosis present

## 2013-04-05 DIAGNOSIS — N1330 Unspecified hydronephrosis: Secondary | ICD-10-CM | POA: Diagnosis present

## 2013-04-05 DIAGNOSIS — N10 Acute tubulo-interstitial nephritis: Principal | ICD-10-CM

## 2013-04-05 DIAGNOSIS — Z794 Long term (current) use of insulin: Secondary | ICD-10-CM

## 2013-04-05 DIAGNOSIS — N12 Tubulo-interstitial nephritis, not specified as acute or chronic: Secondary | ICD-10-CM

## 2013-04-05 DIAGNOSIS — E119 Type 2 diabetes mellitus without complications: Secondary | ICD-10-CM | POA: Diagnosis present

## 2013-04-05 DIAGNOSIS — E785 Hyperlipidemia, unspecified: Secondary | ICD-10-CM | POA: Diagnosis present

## 2013-04-05 DIAGNOSIS — Z79899 Other long term (current) drug therapy: Secondary | ICD-10-CM

## 2013-04-05 DIAGNOSIS — I5022 Chronic systolic (congestive) heart failure: Secondary | ICD-10-CM | POA: Diagnosis present

## 2013-04-05 DIAGNOSIS — R51 Headache: Secondary | ICD-10-CM

## 2013-04-05 DIAGNOSIS — Z923 Personal history of irradiation: Secondary | ICD-10-CM

## 2013-04-05 DIAGNOSIS — I10 Essential (primary) hypertension: Secondary | ICD-10-CM | POA: Diagnosis present

## 2013-04-05 DIAGNOSIS — D509 Iron deficiency anemia, unspecified: Secondary | ICD-10-CM | POA: Diagnosis present

## 2013-04-05 DIAGNOSIS — Z9119 Patient's noncompliance with other medical treatment and regimen: Secondary | ICD-10-CM

## 2013-04-05 DIAGNOSIS — G609 Hereditary and idiopathic neuropathy, unspecified: Secondary | ICD-10-CM | POA: Diagnosis present

## 2013-04-05 DIAGNOSIS — T83192A Other mechanical complication of urinary stent, initial encounter: Secondary | ICD-10-CM

## 2013-04-05 DIAGNOSIS — N201 Calculus of ureter: Secondary | ICD-10-CM | POA: Diagnosis present

## 2013-04-05 DIAGNOSIS — Z91199 Patient's noncompliance with other medical treatment and regimen due to unspecified reason: Secondary | ICD-10-CM

## 2013-04-05 DIAGNOSIS — Z8546 Personal history of malignant neoplasm of prostate: Secondary | ICD-10-CM

## 2013-04-05 DIAGNOSIS — C61 Malignant neoplasm of prostate: Secondary | ICD-10-CM

## 2013-04-05 DIAGNOSIS — J4489 Other specified chronic obstructive pulmonary disease: Secondary | ICD-10-CM | POA: Diagnosis present

## 2013-04-05 HISTORY — DX: Patient's noncompliance with other medical treatment and regimen due to unspecified reason: Z91.199

## 2013-04-05 HISTORY — DX: Unspecified osteoarthritis, unspecified site: M19.90

## 2013-04-05 HISTORY — DX: Headache, unspecified: R51.9

## 2013-04-05 HISTORY — DX: Other chronic pain: G89.29

## 2013-04-05 HISTORY — DX: Headache: R51

## 2013-04-05 HISTORY — DX: Malignant neoplasm of prostate: C61

## 2013-04-05 HISTORY — DX: Anxiety disorder, unspecified: F41.9

## 2013-04-05 HISTORY — DX: Patient's noncompliance with other medical treatment and regimen: Z91.19

## 2013-04-05 LAB — URINALYSIS, ROUTINE W REFLEX MICROSCOPIC
Bilirubin Urine: NEGATIVE
Ketones, ur: 15 mg/dL — AB
Nitrite: NEGATIVE
Protein, ur: 100 mg/dL — AB
pH: 6 (ref 5.0–8.0)

## 2013-04-05 LAB — BASIC METABOLIC PANEL
Calcium: 8.6 mg/dL (ref 8.4–10.5)
Creatinine, Ser: 0.94 mg/dL (ref 0.50–1.35)
GFR calc Af Amer: >90 mL/min (ref >90)
GFR calc non Af Amer: 83 mL/min — ABNORMAL LOW (ref >90)
Sodium: 142 mEq/L (ref 135–145)

## 2013-04-05 LAB — CBC WITH DIFFERENTIAL/PLATELET
Basophils Absolute: 0 10*3/uL (ref 0.0–0.1)
Basophils Relative: 0 % (ref 0–1)
Eosinophils Absolute: 0.1 10*3/uL (ref 0.0–0.7)
Eosinophils Relative: 1 % (ref 0–5)
HCT: 31.2 % — ABNORMAL LOW (ref 39.0–52.0)
MCHC: 32.4 g/dL (ref 30.0–36.0)
MCV: 83.6 fL (ref 78.0–100.0)
Monocytes Absolute: 0.2 10*3/uL (ref 0.1–1.0)
Platelets: 311 10*3/uL (ref 150–400)
RDW: 16.7 % — ABNORMAL HIGH (ref 11.5–15.5)

## 2013-04-05 LAB — TROPONIN I: Troponin I: <0.3 ng/mL (ref <0.30)

## 2013-04-05 LAB — URINE MICROSCOPIC-ADD ON

## 2013-04-05 LAB — GLUCOSE, CAPILLARY: Glucose-Capillary: 305 mg/dL — ABNORMAL HIGH (ref 70–99)

## 2013-04-05 MED ORDER — ACETAMINOPHEN 325 MG PO TABS
650.0000 mg | ORAL_TABLET | Freq: Four times a day (QID) | ORAL | Status: DC | PRN
  Administered 2013-04-06 – 2013-04-08 (×3): 650 mg via ORAL
  Filled 2013-04-05 (×3): qty 2

## 2013-04-05 MED ORDER — HYDRALAZINE HCL 25 MG PO TABS
50.0000 mg | ORAL_TABLET | Freq: Three times a day (TID) | ORAL | Status: DC
  Administered 2013-04-05 – 2013-04-09 (×10): 50 mg via ORAL
  Filled 2013-04-05 (×10): qty 2

## 2013-04-05 MED ORDER — SODIUM CHLORIDE 0.9 % IV SOLN: 250.0000 mL | INTRAVENOUS | Status: DC | PRN

## 2013-04-05 MED ORDER — LISINOPRIL 10 MG PO TABS
40.0000 mg | ORAL_TABLET | Freq: Every day | ORAL | Status: DC
  Administered 2013-04-06 – 2013-04-09 (×4): 40 mg via ORAL
  Filled 2013-04-05 (×4): qty 4

## 2013-04-05 MED ORDER — INSULIN GLARGINE 100 UNIT/ML ~~LOC~~ SOLN
24.0000 [IU] | Freq: Every day | SUBCUTANEOUS | Status: DC
  Administered 2013-04-07 – 2013-04-09 (×3): 24 [IU] via SUBCUTANEOUS
  Filled 2013-04-05 (×5): qty 0.24

## 2013-04-05 MED ORDER — ISOSORBIDE DINITRATE 20 MG PO TABS
20.0000 mg | ORAL_TABLET | Freq: Three times a day (TID) | ORAL | Status: DC
  Administered 2013-04-05 – 2013-04-09 (×10): 20 mg via ORAL
  Filled 2013-04-05 (×10): qty 1

## 2013-04-05 MED ORDER — SODIUM CHLORIDE 0.9 % IJ SOLN: 3.0000 mL | INTRAMUSCULAR | Status: DC | PRN

## 2013-04-05 MED ORDER — ACETAMINOPHEN 325 MG PO TABS
650.0000 mg | ORAL_TABLET | Freq: Once | ORAL | Status: AC
  Administered 2013-04-05: 650 mg via ORAL
  Filled 2013-04-05: qty 2

## 2013-04-05 MED ORDER — ACETAMINOPHEN 650 MG RE SUPP: 650.0000 mg | Freq: Four times a day (QID) | RECTAL | Status: DC | PRN

## 2013-04-05 MED ORDER — SODIUM CHLORIDE 0.9 % IV SOLN
INTRAVENOUS | Status: AC
  Administered 2013-04-05: 22:00:00 via INTRAVENOUS

## 2013-04-05 MED ORDER — PREDNISONE 10 MG PO TABS
5.0000 mg | ORAL_TABLET | Freq: Two times a day (BID) | ORAL | Status: DC
  Administered 2013-04-05 – 2013-04-09 (×7): 5 mg via ORAL
  Filled 2013-04-05 (×7): qty 1

## 2013-04-05 MED ORDER — AMLODIPINE BESYLATE 5 MG PO TABS
10.0000 mg | ORAL_TABLET | Freq: Every day | ORAL | Status: DC
  Administered 2013-04-06 – 2013-04-09 (×4): 10 mg via ORAL
  Filled 2013-04-05 (×4): qty 2

## 2013-04-05 MED ORDER — INSULIN ASPART 100 UNIT/ML ~~LOC~~ SOLN: 0.0000 [IU] | Freq: Three times a day (TID) | SUBCUTANEOUS | Status: DC

## 2013-04-05 MED ORDER — ASPIRIN EC 81 MG PO TBEC
81.0000 mg | DELAYED_RELEASE_TABLET | Freq: Every day | ORAL | Status: DC
  Administered 2013-04-07 – 2013-04-09 (×3): 81 mg via ORAL
  Filled 2013-04-05 (×3): qty 1

## 2013-04-05 MED ORDER — ONDANSETRON HCL 4 MG/2ML IJ SOLN
4.0000 mg | Freq: Four times a day (QID) | INTRAMUSCULAR | Status: DC | PRN
  Administered 2013-04-06: 4 mg via INTRAVENOUS
  Filled 2013-04-05: qty 2

## 2013-04-05 MED ORDER — DEXTROSE 5 % IV SOLN
1.0000 g | INTRAVENOUS | Status: DC
  Administered 2013-04-06 – 2013-04-08 (×3): 1 g via INTRAVENOUS
  Filled 2013-04-05 (×4): qty 10

## 2013-04-05 MED ORDER — SODIUM CHLORIDE 0.9 % IJ SOLN
3.0000 mL | Freq: Two times a day (BID) | INTRAMUSCULAR | Status: DC
  Administered 2013-04-05 – 2013-04-07 (×2): 3 mL via INTRAVENOUS

## 2013-04-05 MED ORDER — ABIRATERONE ACETATE 250 MG PO TABS: 1000.0000 mg | ORAL_TABLET | Freq: Every day | ORAL | Status: DC

## 2013-04-05 MED ORDER — CARVEDILOL 12.5 MG PO TABS
25.0000 mg | ORAL_TABLET | Freq: Two times a day (BID) | ORAL | Status: DC
  Administered 2013-04-05 – 2013-04-09 (×7): 25 mg via ORAL
  Filled 2013-04-05 (×7): qty 2

## 2013-04-05 MED ORDER — QUINAPRIL HCL 10 MG PO TABS: 40.0000 mg | ORAL_TABLET | Freq: Every day | ORAL | Status: DC

## 2013-04-05 MED ORDER — ONDANSETRON HCL 4 MG PO TABS: 4.0000 mg | ORAL_TABLET | Freq: Four times a day (QID) | ORAL | Status: DC | PRN

## 2013-04-05 MED ORDER — DEXTROSE 5 % IV SOLN
1.0000 g | Freq: Once | INTRAVENOUS | Status: AC
  Administered 2013-04-05: 1 g via INTRAVENOUS
  Filled 2013-04-05: qty 10

## 2013-04-05 NOTE — ED Notes (Signed)
Pt complain of headache. States he has a headache all the time but it is worse today. Denies n/v. No neuro deficits

## 2013-04-05 NOTE — ED Notes (Signed)
Report called to [place], RN on unit 300.  Since it is close to shift change, will transport after 1900.

## 2013-04-05 NOTE — H&P (Signed)
History and Physical  Oscar Hickman ZOX:[id number] DOB: [birth year] DOA: 04/05/2013  Referring physician: Samuel Jester, MD PCP: [name], MD   Chief Complaint: headache, right back pain  HPI:  72 year old man presented to the emergency department with complaint of headache and right flank pain. Initial evaluation was notable for UTI, suspected pyelonephritis, severe right hydronephrosis and non-functioning stent. Initial requested to facilitate timely removal and antibiotic therapy.  History obtained from patient, son and wife at bedside. Patient notes that he has had chronic headaches and came to the emergency department today to have that evaluated. He does not appear there has been a significant change in the character of his headaches. No reported neurologic issues. He also complains of one week of right-sided flank pain. Despite repeated questioning he cannot characterize it but notes that it is not very intense at this point. He is unable to describe aggravating or alleviating factors. His history of prostate cancer and severe right hydronephrosis and had a renal stent placed approximately 2 or more years ago.  In the emergency department he was noted to be afebrile stable vital signs. Basic metabolic panel, troponin and CBC were essentially unremarkable. Urinalysis was positive, urine culture obtained. CT head was negative. CT of the abdomen and pelvis as noted.  Review of Systems:  Negative for fever, visual changes, sore throat, rash, chest pain, SOB, dysuria, bleeding, n/v/abdominal pain.  Past Medical History  Diagnosis Date  . Hypertension   . Skin infection     history  . HTN (hypertension) 11/23/2011  . High cholesterol   . Diabetes mellitus   . Chronic headache     daily  . Anxiety   . Osteoarthritis   . COPD (chronic obstructive pulmonary disease)     patient denies  . Peripheral neuropathy   . Prostate cancer 2009    s/p resection and radiation,  metatstatic  . CHF (congestive heart failure)   . Medical non-compliance   . Heart disease     Past Surgical History  Procedure Laterality Date  . Transurethral resection of prostate  03/2008  . Cardiac catheterization  02/2009    minimal non-obstructive CAD    Social History:  reports that he has never smoked. He has never used smokeless tobacco. He reports that he does not drink alcohol or use illicit drugs.  No Known Allergies  Family History  Problem Relation Age of Onset  . Cancer Mother   . Cancer Father      Prior to Admission medications   Medication Sig Start Date End Date Taking? Authorizing Provider  amLODipine (NORVASC) 10 MG tablet Take 10 mg by mouth daily.   Yes Historical Provider, MD  aspirin EC 81 MG tablet Take 81 mg by mouth daily.   Yes Historical Provider, MD  carvedilol (COREG) 25 MG tablet Take 25 mg by mouth 2 (two) times daily with a meal.   Yes Historical Provider, MD  hydrALAZINE (APRESOLINE) 50 MG tablet Take 50 mg by mouth 3 (three) times daily.   Yes Historical Provider, MD  insulin glargine (LANTUS) 100 UNIT/ML injection Inject 24 Units into the skin daily.    Yes Historical Provider, MD  isosorbide dinitrate (ISORDIL) 20 MG tablet Take 20 mg by mouth 3 (three) times daily.  06/18/11  Yes Historical Provider, MD  metFORMIN (GLUCOPHAGE) 500 MG tablet Take 500 mg by mouth 2 (two) times daily with a meal.     Yes Historical Provider, MD  predniSONE (DELTASONE) 5 MG tablet Take  1 tablet (5 mg total) by mouth 2 (two) times daily. 03/09/13  Yes Maurine Minister [name], PA-C  quinapril (ACCUPRIL) 40 MG tablet Take 40 mg by mouth daily.     Yes Historical Provider, MD  abiraterone Acetate (ZYTIGA) 250 MG tablet Take 4 tablets (1,000 mg total) by mouth daily. Take on an empty stomach 1 hour before or 2 hours after a meal 04/05/13   Ellouise Newer, PA-C   Physical Exam: Filed Vitals:   04/05/13 1300 04/05/13 1347 04/05/13 1400 04/05/13 1747  BP: 157/87 145/100  161/87 141/85  Pulse: 86 85 86   Temp:      TempSrc:      Resp: 31 30 22 26   Height:      Weight:      SpO2: 96% 96% 95%     General: Examined in the emergency department. Appears calm and comfortable Eyes: PERRL, normal lids, irises ENT: grossly normal hearing, lips & tongue Neck: no LAD, masses or thyromegaly Cardiovascular: RRR, no m/r/g. No LE edema. Respiratory: CTA bilaterally, no w/r/r. Normal respiratory effort. Abdomen: soft, ntnd. Positive right-sided flank pain. Skin: no rash or induration seen  Musculoskeletal: grossly normal tone BUE/BLE Psychiatric: grossly normal mood and affect, speech fluent and appropriate Neurologic: grossly non-focal.  Wt Readings from Last 3 Encounters:  04/05/13 97.07 kg (214 lb)  01/17/13 93.622 kg (206 lb 6.4 oz)  11/22/12 90.719 kg (200 lb)    Labs on Admission:  Basic Metabolic Panel:  Recent Labs Lab 04/05/13 1233  NA 142  K 3.4*  CL 106  CO2 26  GLUCOSE 88  BUN 18  CREATININE 0.94  CALCIUM 8.6    CBC:  Recent Labs Lab 04/05/13 1233  WBC 7.8  NEUTROABS 6.5  HGB 10.1*  HCT 31.2*  MCV 83.6  PLT 311    Cardiac Enzymes:  Recent Labs Lab 04/05/13 1233  TROPONINI <0.30    Radiological Exams on Admission: Dg Chest Portable 1 View  04/05/2013   *RADIOLOGY REPORT*  Clinical Data: Headache, weakness, shortness of breath.  PORTABLE CHEST - 1 VIEW  Comparison: 11/22/2012  Findings: Cardiomegaly.  Prominent central pulmonary vasculature suggests possibility of pulmonary arterial hypertension.  Findings are similar to prior study.  No confluent airspace opacities or overt edema.  No effusions or acute bony abnormality.  IMPRESSION: Cardiomegaly.  Probable pulmonary arterial hypertension.  Findings similar to prior study.   Original Report Authenticated By: Charlett Nose, M.D.    EKG: Independently reviewed. Normal sinus rhythm. No acute changes. PVC   Principal Problem:   Hydronephrosis of right kidney Active  Problems:   DIABETES MELLITUS, TYPE II   HTN (hypertension)   Cardiomyopathy, nonischemic   Pyelonephritis   Assessment/Plan 1. Acute pyelonephritis: Rocephin. Followup culture. 2. Severe right hydronephrosis with suspected pyelonephritis: CT suggests an element of obstruction and migration of the stent. Stent has been in place more than 2 years. Discussed with Dr. Irine Seal will see in consultation and plan for stent replacement 5/30. 3. Hypertension: Stable. Continue beta blocker, ACE inhibitor, hydralazine. 4. Diabetes mellitus: Stable. Hold metformin. Sliding-scale insulin. Continue Lantus. 5. Chronic systolic heart failure: Appears well compensated. Continue Coreg, aspirin, Isordil, quinapril 6. Chronic headaches: CT head negative. No no signs or symptoms to suggest acute process. 7. History of prostate cancer  Code Status: Full code Family Communication: Discussed with wife and 2 sons at bedside Disposition Plan/Anticipated LOS: Observation, less than 24 hours  Time spent: 50 minutes  Brendia Sacks, MD  [facility] Pager [phone] 04/05/2013, 6:15 PM

## 2013-04-05 NOTE — ED Provider Notes (Signed)
History     CSN: [id number]  Arrival date & time 04/05/13  1210   First MD Initiated Contact with Patient 04/05/13 1326      Chief Complaint  Patient presents with  . Headache  . Flank Pain     HPI Pt was seen at 1355.  Per pt, c/o gradual onset and persistence of constant acute flair of his chronic daily headache for the past several years, worse over the past 3 to 4 months. Describes the headache as per his usual chronic headache pain pattern for several years. States he "hasn't mentioned" the headache to his PMD or Heme/Onc MD.   Denies headache was sudden or maximal in onset or at any time.  Denies visual changes, no focal motor weakness, no tingling/numbness in extremities, no fevers, no neck pain, no rash.  Pt also c/o gradual onset and persistence of constant right flank "pain" for the past 1 week. Denies rash, no injury, no fevers, no dysuria/hematuria, no testicular pain/swelling, no CP/SOB, no abd pain.  Denies incont/retention of bowel or bladder, no saddle anesthesia, no focal motor weakness, no tingling/numbness in extremities.     Past Medical History  Diagnosis Date  . Skin infection     history  . HTN (hypertension) 11/23/2011  . High cholesterol   . Diabetes mellitus   . Chronic headache     daily  . Anxiety   . Osteoarthritis   . COPD (chronic obstructive pulmonary disease)     patient denies  . Peripheral neuropathy   . 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 CHF (congestive heart failure)   . Medical non-compliance   . Heart disease     Past Surgical History  Procedure Laterality Date  . Transurethral resection of prostate  03/2008  . Cardiac catheterization  02/2009    minimal non-obstructive CAD    Family History  Problem Relation Age of Onset  . Cancer Mother   . Cancer Father     History  Substance Use Topics  . Smoking status: Never Smoker   . Smokeless tobacco: Never Used  . Alcohol Use: No      Review  of Systems ROS: Statement: All systems negative except as marked or noted in the HPI; Constitutional: Negative for fever and chills. ; ; Eyes: Negative for eye pain, redness and discharge. ; ; ENMT: Negative for ear pain, hoarseness, nasal congestion, sinus pressure and sore throat. ; ; Cardiovascular: Negative for chest pain, palpitations, diaphoresis, dyspnea and peripheral edema. ; ; Respiratory: Negative for cough, wheezing and stridor. ; ; Gastrointestinal: Negative for nausea, vomiting, diarrhea, abdominal pain, blood in stool, hematemesis, jaundice and rectal bleeding. . ; ; Genitourinary: +flank pain. Negative for dysuria and hematuria. ; ; Genital:  No penile drainage or rash, no testicular pain or swelling, no scrotal rash or swelling.;; Musculoskeletal: Negative for back pain and neck pain. Negative for swelling and trauma.; ; Skin: Negative for pruritus, rash, abrasions, blisters, bruising and skin lesion.; ; Neuro: +headache. Negative for lightheadedness and neck stiffness. Negative for weakness, altered level of consciousness , altered mental status, extremity weakness, paresthesias, involuntary movement, seizure and syncope.       Allergies  Review of patient's allergies indicates no known allergies.  Home Medications   Current Outpatient Rx  Name  Route  Sig  Dispense  Refill  . amLODipine (NORVASC) 10 MG tablet   Oral   Take 10 mg by mouth daily.         Marland Kitchen  aspirin EC 81 MG tablet   Oral   Take 81 mg by mouth daily.         . carvedilol (COREG) 25 MG tablet   Oral   Take 25 mg by mouth 2 (two) times daily with a meal.         . hydrALAZINE (APRESOLINE) 50 MG tablet   Oral   Take 50 mg by mouth 3 (three) times daily.         . insulin glargine (LANTUS) 100 UNIT/ML injection   Subcutaneous   Inject 24 Units into the skin daily.          . isosorbide dinitrate (ISORDIL) 20 MG tablet   Oral   Take 20 mg by mouth 3 (three) times daily.          . metFORMIN  (GLUCOPHAGE) 500 MG tablet   Oral   Take 500 mg by mouth 2 (two) times daily with a meal.           . predniSONE (DELTASONE) 5 MG tablet   Oral   Take 1 tablet (5 mg total) by mouth 2 (two) times daily.   60 tablet   0   . quinapril (ACCUPRIL) 40 MG tablet   Oral   Take 40 mg by mouth daily.           Marland Kitchen abiraterone Acetate (ZYTIGA) 250 MG tablet   Oral   Take 4 tablets (1,000 mg total) by mouth daily. Take on an empty stomach 1 hour before or 2 hours after a meal   120 tablet   0     BP 141/85  Pulse 86  Temp(Src) 99 F (37.2 C) (Axillary)  Resp 26  Ht 5\' 6"  (1.676 m)  Wt 214 lb (97.07 kg)  BMI 34.56 kg/m2  SpO2 95%  Physical Exam 1400: Physical examination:  Nursing notes reviewed; Vital signs and O2 SAT reviewed;  Constitutional: Well developed, Well nourished, Well hydrated, In no acute distress; Head:  Normocephalic, atraumatic; Eyes: EOMI, PERRL, No scleral icterus; ENMT: Mouth and pharynx normal, Mucous membranes moist; Neck: Supple, Full range of motion, No lymphadenopathy; Cardiovascular: Regular rate and rhythm, No gallop; Respiratory: Breath sounds clear & equal bilaterally, No rales, rhonchi, wheezes.  Speaking full sentences with ease, Normal respiratory effort/excursion; Chest: Nontender, Movement normal; Abdomen: Soft, Nontender, Nondistended, Normal bowel sounds; Genitourinary: +right CVA tenderness; Spine:  No midline CS, TS, LS tenderness. +TTP right lumbar paraspinal muscles. No rash.;; Extremities: Pulses normal, No tenderness, No edema, No calf edema or asymmetry.; Neuro: AA&Ox3, vague historian. Major CN grossly intact. No facial droop. Speech clear. No gross focal motor or sensory deficits in extremities.; Skin: Color normal, Warm, Dry.   ED Course  Procedures      MDM  MDM Reviewed: previous chart, nursing note and vitals Reviewed previous: labs, ECG and CT scan Interpretation: labs, ECG, CT scan and x-ray    Date: 04/05/2013  Rate: 889   Rhythm: normal sinus rhythm and premature ventricular contractions (PVC)  QRS Axis: left  Intervals: QT prolonged  ST/T Wave abnormalities: nonspecific T wave changes  Conduction Disutrbances:none  Narrative Interpretation:   Old EKG Reviewed: unchanged; no significant changes from previous EKG dated 11/22/2012.  Results for orders placed during the hospital encounter of 04/05/13  CBC WITH DIFFERENTIAL      Result Value Range   WBC 7.8  4.0 - 10.5 K/uL   RBC 3.73 (*) 4.22 - 5.81 MIL/uL   Hemoglobin 10.1 (*)  13.0 - 17.0 g/dL   HCT 08.6 (*) 57.8 - 46.9 %   MCV 83.6  78.0 - 100.0 fL   MCH 27.1  26.0 - 34.0 pg   MCHC 32.4  30.0 - 36.0 g/dL   RDW 62.9 (*) 52.8 - 41.3 %   Platelets 311  150 - 400 K/uL   Neutrophils Relative % 84 (*) 43 - 77 %   Neutro Abs 6.5  1.7 - 7.7 K/uL   Lymphocytes Relative 12  12 - 46 %   Lymphs Abs 0.9  0.7 - 4.0 K/uL   Monocytes Relative 3  3 - 12 %   Monocytes Absolute 0.2  0.1 - 1.0 K/uL   Eosinophils Relative 1  0 - 5 %   Eosinophils Absolute 0.1  0.0 - 0.7 K/uL   Basophils Relative 0  0 - 1 %   Basophils Absolute 0.0  0.0 - 0.1 K/uL  BASIC METABOLIC PANEL      Result Value Range   Sodium 142  135 - 145 mEq/L   Potassium 3.4 (*) 3.5 - 5.1 mEq/L   Chloride 106  96 - 112 mEq/L   CO2 26  19 - 32 mEq/L   Glucose, Bld 88  70 - 99 mg/dL   BUN 18  6 - 23 mg/dL   Creatinine, Ser 2.44  0.50 - 1.35 mg/dL   Calcium 8.6  8.4 - 01.0 mg/dL   GFR calc non Af Amer 83 (*) >90 mL/min   GFR calc Af Amer >90  >90 mL/min  TROPONIN I      Result Value Range   Troponin I <0.30  <0.30 ng/mL  URINALYSIS, ROUTINE W REFLEX MICROSCOPIC      Result Value Range   Color, Urine YELLOW  YELLOW   APPearance HAZY (*) CLEAR   Specific Gravity, Urine >1.030 (*) 1.005 - 1.030   pH 6.0  5.0 - 8.0   Glucose, UA NEGATIVE  NEGATIVE mg/dL   Hgb urine dipstick SMALL (*) NEGATIVE   Bilirubin Urine NEGATIVE  NEGATIVE   Ketones, ur 15 (*) NEGATIVE mg/dL   Protein, ur 272 (*) NEGATIVE  mg/dL   Urobilinogen, UA 4.0 (*) 0.0 - 1.0 mg/dL   Nitrite NEGATIVE  NEGATIVE   Leukocytes, UA MODERATE (*) NEGATIVE  URINE MICROSCOPIC-ADD ON      Result Value Range   Squamous Epithelial / LPF RARE  RARE   WBC, UA 11-20  <3 WBC/hpf   RBC / HPF 7-10  <3 RBC/hpf   Bacteria, UA MANY (*) RARE   Dg Chest Portable 1 View 04/05/2013   *RADIOLOGY REPORT*  Clinical Data: Headache, weakness, shortness of breath.  PORTABLE CHEST - 1 VIEW  Comparison: 11/22/2012  Findings: Cardiomegaly.  Prominent central pulmonary vasculature suggests possibility of pulmonary arterial hypertension.  Findings are similar to prior study.  No confluent airspace opacities or overt edema.  No effusions or acute bony abnormality.  IMPRESSION: Cardiomegaly.  Probable pulmonary arterial hypertension.  Findings similar to prior study.   Original Report Authenticated By: Charlett Nose, M.D.   Ct Abdomen Pelvis Wo Contrast 04/05/2013   *RADIOLOGY REPORT*  Clinical Data: Right flank pain  CT ABDOMEN AND PELVIS WITHOUT CONTRAST  Technique:  Multidetector CT imaging of the abdomen and pelvis was performed following the standard protocol without intravenous contrast.  Comparison: 04/20/2012  Findings: Severe right hydronephrosis is worse.  There is marked right perinephric stranding again, indicating and evidence of obstruction.  The right ureteral stent is  stable in position.  The proximal and is coiled in the right renal pelvis.  The distal and is coiled within the urethra well beyond the expected location of the prostate gland which has been removed.  No evidence of urinary calculus.  Minimal left perinephric stranding is stable.  Dependent atelectasis at the lung bases.  The heart is markedly enlarged.  Aortic valve calcifications.  LAD and right coronary artery calcifications.  Mitral annular calcification.  Small hiatal hernia.  Unenhanced liver, gallbladder, spleen, pancreas, and adrenal glands are within normal limits.  No evidence of  free fluid.  Bladder is decompressed.  Umbilical hernia contains only adipose tissue.  There is extensive stranding surrounding the length of the ureter extending into the pelvis.  Left inguinal hernia contains only adipose tissue.  Atherosclerotic calcifications of the aorta and visceral vasculature.  Unenhanced imaging of the vessels demonstrates ectasia of the SMA.  It is 14 mm in caliber.  Celiac is also mildly ectatic.  No destructive bone lesion or vertebral body fracture.  Sclerotic focus in the left side of the L4 vertebral body is stable.  IMPRESSION: There is severe hydronephrosis and prominent perinephric stranding involving the right kidney and ureter.  The right ureteral stent is in place at these findings are compatible with an element of obstruction.  The distal end of the ureteral stent is in the urethra and may not be functioning optimally.  Chronic findings are stable.   Original Report Authenticated By: Jolaine Click, M.D.   Ct Head Wo Contrast 04/05/2013   *RADIOLOGY REPORT*  Clinical Data: Headache  CT HEAD WITHOUT CONTRAST  Technique:  Contiguous axial images were obtained from the base of the skull through the vertex without contrast.  Comparison: 12/04/2011  Findings: Moderate global atrophy.  Little if any chronic ischemic changes in the periventricular white matter.  Small lacunar infarct in the right cerebellar hemisphere on image 6 is stable.  No mass effect, midline shift, or acute intracranial hemorrhage.  Mastoid air cells are clear.  Partial opacification of posterior ethmoid air cells.  Remainder of the visualized paranasal sinuses are clear.  Cerumen in both external auditory canals.  Intact cranium.  IMPRESSION: No acute intracranial pathology.   Original Report Authenticated By: Jolaine Click, M.D.     1730:  Pt and his wife state pt has had the right ureteral stent for "at least 2 years, maybe more."  Concern regarding UTI, right flank pain, and worsening obstruction with long  standing ureteral stent.  UC obtained, will start IV rocephin.  H/H per baseline. Pt's headache improved after tylenol.  T/C to Uro Dr. Retta Diones, case discussed, including:  HPI, pertinent PM/SHx, VS/PE, dx testing, ED course and treatment:  requests it is OK to admit pt to [facility] tonight, start IV abx after UC obtained, consult Uro Dr. Jerre Simon in the morning; there is no acute surgical emergency at this time.  1745:  T/C to [facility] Dr. Irene Limbo, case discussed, including:  HPI, pertinent PM/SHx, VS/PE, dx testing, ED course and treatment, as well as d/w Uro MD:  Agreeable to admit, requests to write temporary orders, obtain medical bed to team 1.  1800:  T/C back from U[facility] Dr. Jerre Simon, case discussed, including:  HPI, pertinent PM/SHx, VS/PE, dx testing, ED course and treatment:  Agreeable to consult.               Laray Anger, DO 04/08/13 0110

## 2013-04-06 ENCOUNTER — Encounter (HOSPITAL_COMMUNITY): Payer: Self-pay | Admitting: *Deleted

## 2013-04-06 ENCOUNTER — Encounter (HOSPITAL_COMMUNITY): Payer: Self-pay | Admitting: Anesthesiology

## 2013-04-06 ENCOUNTER — Observation Stay (HOSPITAL_COMMUNITY): Payer: Medicare Other | Admitting: Anesthesiology

## 2013-04-06 ENCOUNTER — Encounter (HOSPITAL_COMMUNITY)
Admission: EM | Disposition: A | Discharge status: Home Health | Payer: Self-pay | Source: Home / Self Care | Attending: Family Medicine

## 2013-04-06 ENCOUNTER — Observation Stay (HOSPITAL_COMMUNITY): Payer: Medicare Other

## 2013-04-06 HISTORY — PX: CYSTOSCOPY W/ URETERAL STENT REMOVAL: SHX1430

## 2013-04-06 HISTORY — PX: CYSTOSCOPY W/ URETERAL STENT PLACEMENT: SHX1429

## 2013-04-06 LAB — SURGICAL PCR SCREEN: MRSA, PCR: NEGATIVE

## 2013-04-06 LAB — BASIC METABOLIC PANEL
Calcium: 8.6 mg/dL (ref 8.4–10.5)
GFR calc non Af Amer: 72 mL/min — ABNORMAL LOW (ref >90)
Glucose, Bld: 190 mg/dL — ABNORMAL HIGH (ref 70–99)
Potassium: 3.6 mEq/L (ref 3.5–5.1)
Sodium: 140 mEq/L (ref 135–145)

## 2013-04-06 LAB — GLUCOSE, CAPILLARY
Glucose-Capillary: 146 mg/dL — ABNORMAL HIGH (ref 70–99)
Glucose-Capillary: 123 mg/dL — ABNORMAL HIGH (ref 70–99)
Glucose-Capillary: 260 mg/dL — ABNORMAL HIGH (ref 70–99)

## 2013-04-06 LAB — RETICULOCYTES: Retic Count, Absolute: 29.4 10*3/uL (ref 19.0–186.0)

## 2013-04-06 LAB — FERRITIN: Ferritin: 38 ng/mL (ref 22–322)

## 2013-04-06 LAB — MAGNESIUM: Magnesium: 2 mg/dL (ref 1.5–2.5)

## 2013-04-06 LAB — IRON AND TIBC: Iron: <10 ug/dL — ABNORMAL LOW (ref 42–135)

## 2013-04-06 SURGERY — CYSTOSCOPY, WITH RETROGRADE PYELOGRAM AND URETERAL STENT INSERTION
Anesthesia: Spinal | Laterality: Right | Wound class: Clean Contaminated

## 2013-04-06 MED ORDER — ALBUTEROL SULFATE (5 MG/ML) 0.5% IN NEBU
INHALATION_SOLUTION | RESPIRATORY_TRACT | Status: AC
  Filled 2013-04-06: qty 0.5

## 2013-04-06 MED ORDER — FENTANYL CITRATE 0.05 MG/ML IJ SOLN
INTRAMUSCULAR | Status: AC
  Filled 2013-04-06: qty 2

## 2013-04-06 MED ORDER — IPRATROPIUM BROMIDE 0.02 % IN SOLN
0.5000 mg | Freq: Four times a day (QID) | RESPIRATORY_TRACT | Status: DC
  Administered 2013-04-06 – 2013-04-09 (×12): 0.5 mg via RESPIRATORY_TRACT
  Filled 2013-04-06 (×12): qty 2.5

## 2013-04-06 MED ORDER — IPRATROPIUM-ALBUTEROL 0.5-2.5 (3) MG/3ML IN SOLN: 3.0000 mL | Freq: Four times a day (QID) | RESPIRATORY_TRACT | Status: DC

## 2013-04-06 MED ORDER — FENTANYL CITRATE 0.05 MG/ML IJ SOLN
INTRAMUSCULAR | Status: DC | PRN
  Administered 2013-04-06: 25 ug via INTRAVENOUS

## 2013-04-06 MED ORDER — MUPIROCIN 2 % EX OINT
TOPICAL_OINTMENT | Freq: Two times a day (BID) | CUTANEOUS | Status: DC
  Administered 2013-04-06: 1 via NASAL

## 2013-04-06 MED ORDER — MUPIROCIN 2 % EX OINT
TOPICAL_OINTMENT | CUTANEOUS | Status: AC
  Filled 2013-04-06: qty 22

## 2013-04-06 MED ORDER — INSULIN ASPART 100 UNIT/ML ~~LOC~~ SOLN
0.0000 [IU] | Freq: Every day | SUBCUTANEOUS | Status: DC
  Administered 2013-04-06: 3 [IU] via SUBCUTANEOUS

## 2013-04-06 MED ORDER — HYDROMORPHONE HCL PF 1 MG/ML IJ SOLN
0.5000 mg | INTRAMUSCULAR | Status: DC | PRN
  Administered 2013-04-07 – 2013-04-08 (×7): 0.5 mg via INTRAVENOUS
  Filled 2013-04-06 (×7): qty 1

## 2013-04-06 MED ORDER — SODIUM CHLORIDE 0.45 % IV SOLN
INTRAVENOUS | Status: DC
  Administered 2013-04-06 – 2013-04-09 (×6): via INTRAVENOUS

## 2013-04-06 MED ORDER — LABETALOL HCL 5 MG/ML IV SOLN
INTRAVENOUS | Status: AC
  Filled 2013-04-06: qty 4

## 2013-04-06 MED ORDER — PROPOFOL INFUSION 10 MG/ML OPTIME
INTRAVENOUS | Status: DC | PRN
  Administered 2013-04-06: 25 ug/kg/min via INTRAVENOUS

## 2013-04-06 MED ORDER — CEFAZOLIN SODIUM 1-5 GM-% IV SOLN
1.0000 g | Freq: Three times a day (TID) | INTRAVENOUS | Status: DC
  Administered 2013-04-06 – 2013-04-07 (×4): 1 g via INTRAVENOUS
  Filled 2013-04-06 (×10): qty 50

## 2013-04-06 MED ORDER — LABETALOL HCL 5 MG/ML IV SOLN
10.0000 mg | INTRAVENOUS | Status: DC | PRN
  Administered 2013-04-06 (×2): 10 mg via INTRAVENOUS

## 2013-04-06 MED ORDER — IOHEXOL 350 MG/ML SOLN
INTRAVENOUS | Status: DC | PRN
  Administered 2013-04-06: 50 mL

## 2013-04-06 MED ORDER — LIDOCAINE HCL (PF) 1 % IJ SOLN
INTRAMUSCULAR | Status: AC
  Filled 2013-04-06: qty 5

## 2013-04-06 MED ORDER — STERILE WATER FOR IRRIGATION IR SOLN
Status: DC | PRN
  Administered 2013-04-06: 1000 mL

## 2013-04-06 MED ORDER — FUROSEMIDE 10 MG/ML IJ SOLN
INTRAMUSCULAR | Status: AC
  Filled 2013-04-06: qty 4

## 2013-04-06 MED ORDER — ONDANSETRON HCL 4 MG/2ML IJ SOLN
4.0000 mg | Freq: Once | INTRAMUSCULAR | Status: DC | PRN
Start: 2013-04-06 — End: 2013-04-06

## 2013-04-06 MED ORDER — FENTANYL CITRATE 0.05 MG/ML IJ SOLN: 25.0000 ug | INTRAMUSCULAR | Status: DC | PRN

## 2013-04-06 MED ORDER — PROPOFOL 10 MG/ML IV EMUL
INTRAVENOUS | Status: AC
  Filled 2013-04-06: qty 20

## 2013-04-06 MED ORDER — MIDAZOLAM HCL 2 MG/2ML IJ SOLN
INTRAMUSCULAR | Status: AC
  Filled 2013-04-06: qty 2

## 2013-04-06 MED ORDER — ALBUTEROL SULFATE (5 MG/ML) 0.5% IN NEBU
2.5000 mg | INHALATION_SOLUTION | Freq: Four times a day (QID) | RESPIRATORY_TRACT | Status: DC
  Administered 2013-04-06 – 2013-04-09 (×12): 2.5 mg via RESPIRATORY_TRACT
  Filled 2013-04-06 (×12): qty 0.5

## 2013-04-06 MED ORDER — SODIUM CHLORIDE 0.9 % IN NEBU
INHALATION_SOLUTION | RESPIRATORY_TRACT | Status: AC
  Filled 2013-04-06: qty 3

## 2013-04-06 MED ORDER — MIDAZOLAM HCL 2 MG/2ML IJ SOLN
1.0000 mg | INTRAMUSCULAR | Status: DC | PRN
  Administered 2013-04-06 (×2): 1 mg via INTRAVENOUS

## 2013-04-06 MED ORDER — ALBUTEROL SULFATE (5 MG/ML) 0.5% IN NEBU
2.5000 mg | INHALATION_SOLUTION | Freq: Four times a day (QID) | RESPIRATORY_TRACT | Status: DC | PRN
  Administered 2013-04-06: 2.5 mg via RESPIRATORY_TRACT

## 2013-04-06 MED ORDER — FUROSEMIDE 10 MG/ML IJ SOLN
20.0000 mg | Freq: Once | INTRAMUSCULAR | Status: AC
  Administered 2013-04-06: 20 mg via INTRAVENOUS

## 2013-04-06 MED ORDER — BUPIVACAINE IN DEXTROSE 0.75-8.25 % IT SOLN
INTRATHECAL | Status: DC | PRN
  Administered 2013-04-06: 1.8 mL via INTRATHECAL

## 2013-04-06 MED ORDER — SODIUM CHLORIDE 0.9 % IR SOLN
Status: DC | PRN
  Administered 2013-04-06 (×3): 3000 mL

## 2013-04-06 MED ORDER — LACTATED RINGERS IV SOLN
INTRAVENOUS | Status: DC
  Administered 2013-04-06: 09:00:00 via INTRAVENOUS

## 2013-04-06 MED ORDER — FENTANYL CITRATE 0.05 MG/ML IJ SOLN
25.0000 ug | INTRAMUSCULAR | Status: AC | PRN
  Administered 2013-04-06 (×3): 25 ug via INTRAVENOUS

## 2013-04-06 MED ORDER — FUROSEMIDE 10 MG/ML IJ SOLN: 20.0000 mg | Freq: Once | INTRAMUSCULAR | Status: DC

## 2013-04-06 MED ORDER — INSULIN ASPART 100 UNIT/ML ~~LOC~~ SOLN
0.0000 [IU] | Freq: Three times a day (TID) | SUBCUTANEOUS | Status: DC
  Administered 2013-04-07: 5 [IU] via SUBCUTANEOUS
  Administered 2013-04-07 – 2013-04-08 (×3): 2 [IU] via SUBCUTANEOUS
  Administered 2013-04-08 – 2013-04-09 (×3): 1 [IU] via SUBCUTANEOUS
  Administered 2013-04-09: 2 [IU] via SUBCUTANEOUS

## 2013-04-06 SURGICAL SUPPLY — 30 items
BAG DRAIN URO TABLE W/ADPT NS (DRAPE) ×2 IMPLANT
BAG DRN 8 ADPR NS SKTRN CSTL (DRAPE) ×1
CATH 5 FR WEDGE TIP (UROLOGICAL SUPPLIES) ×2 IMPLANT
CATH OPEN TIP 5FR (CATHETERS) ×3 IMPLANT
CATH OPEN TIP 6FR (CATHETERS) ×1 IMPLANT
CLOTH BEACON ORANGE TIMEOUT ST (SAFETY) ×2 IMPLANT
DILATOR UROMAX ULTRA (MISCELLANEOUS) ×1 IMPLANT
GLIDEWIRE 3CM TIP (WIRE) ×1 IMPLANT
GLOVE BIO SURGEON STRL SZ7 (GLOVE) ×2 IMPLANT
GLOVE BIOGEL PI IND STRL 7.0 (GLOVE) IMPLANT
GLOVE BIOGEL PI INDICATOR 7.0 (GLOVE) ×1
GLOVE ECLIPSE 6.5 STRL STRAW (GLOVE) ×2 IMPLANT
GOWN STRL REIN XL XLG (GOWN DISPOSABLE) ×3 IMPLANT
GUIDEWIRE STR BENTSON 035X150 (WIRE) ×1 IMPLANT
IV NS IRRIG 3000ML ARTHROMATIC (IV SOLUTION) ×5 IMPLANT
KIT ROOM TURNOVER AP CYSTO (KITS) ×2 IMPLANT
MANIFOLD NEPTUNE II (INSTRUMENTS) ×2 IMPLANT
PACK CYSTO (CUSTOM PROCEDURE TRAY) ×2 IMPLANT
PAD ARMBOARD 7.5X6 YLW CONV (MISCELLANEOUS) ×2 IMPLANT
SET IRRIGATING DISP (SET/KITS/TRAYS/PACK) ×1 IMPLANT
SHEATH DILATOR SET 8/10 (MISCELLANEOUS) ×1 IMPLANT
SHEATH URET ACCESS 12FR/35CM (UROLOGICAL SUPPLIES) ×1 IMPLANT
STENT PERCUFLEX 4.8FRX24 (STENTS) IMPLANT
STENT URET 6FRX24 CONTOUR (STENTS) ×1 IMPLANT
STONE RETRIEVAL GEMINI 2.4 FR (MISCELLANEOUS) IMPLANT
SUT SILK 2 0 (SUTURE) ×2
SUT SILK 2-0 18XBRD TIE 12 (SUTURE) IMPLANT
SYR CONTROL 10ML LL (SYRINGE) ×1 IMPLANT
TOWEL OR 17X26 4PK STRL BLUE (TOWEL DISPOSABLE) ×2 IMPLANT
WIRE GUIDE BENTSON .035 15CM (WIRE) ×2 IMPLANT

## 2013-04-06 NOTE — Consult Note (Signed)
NAME:  Oscar Hickman, Oscar Hickman NO.:  [id number]  MEDICAL RECORD NO.:  [id number]  LOCATION:  A325                          FACILITY:  [facility]  PHYSICIAN:  Oscar Hickman, M.D.DATE OF BIRTH:  1941-10-17  DATE OF CONSULTATION: DATE OF DISCHARGE:                                CONSULTATION   HISTORY OF PRESENT ILLNESS:  Oscar Hickman is a 72 year old gentleman who is well-known to me.  I have taken care of him in the past, but I have not seen him for several years.  He presented to the emergency room with complaint of headache and right flank pain.  It is very difficult to get any history from the patient and when I came to see him his family had already left, so I obtained the history from the patient, and some of it from the chart.  He has chronic headache and came to emergency room today to get it evaluated.  There is no significant change in the character of the headache.  No reported neurological issues.  He is having right flank pain.  No nausea, vomiting, fever, or chills, or gross hematuria.  CT scan in the emergency room without contrast was done.  It shows there is significant hydronephrosis on the right side with double-J stent, which is there for the last couple of years, but there is no calcification.  The ureter is dilated all the way down to the ureterovesical junction.  The stent has migrated distally into the prostatic urethra.  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  History of congestive heart failure, CHF, history of cardiac catheterization in April 2010, minimum nonobstructive coronary artery disease.  SOCIAL HISTORY:  He never smoked and does not drink any alcohol or use illicit  drugs.  REVIEW OF SYSTEMS:  Complains of some shortness of breath and chronic headaches.  No nausea, vomiting, fever, chills, or gross hematuria.  PHYSICAL EXAMINATION:  GENERAL:  Moderately built male who is alert and oriented, does not appear to be in any acute distress. VITAL SIGNS:  Blood pressure is 157/87, pulse 76 per minute, temperature is 98.2, CO2 is 96% on room air. ENT:  Negative. NECK:  Supple.  No masses or thyromegaly. HEART:  Regular sinus rhythm.  No murmur. RESPIRATORY:  Chest on auscultation appears normal. ABDOMEN:  Soft, flat.  Liver, spleen, kidneys not palpable.  1+ right CVA tenderness. SKIN:  No rash or induration.  LABORATORY WORKUP:  Sodium 142, potassium 3.4, chloride 106, CO2 is 26, glucose 88, BUN is 18, creatinine 0.94.  WBC count is 7.8, hematocrit is 31.2.  Chest x-ray shows comparison with November 22, 2012.  Findings are cardiomegaly, prominent central pulmonary vasculature suggestive of possibility of pulmonary arterial hypertension.  Findings are similar to prior study.  No confluent air space opacity or edema seen.  No effusion or acute bony abnormalities.  CT scan shows marked hydronephrosis right side with double-J stent, which is obviously obstructed.  IMPRESSION: 1. Right urinary tract obstruction.  2. Right pyelonephritis. 3. Hypertension. 4. Cardiomegaly, nonischemic. 5. Type 2 diabetes. 6. Chronic headache. 7. 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  PLAN:  I have discussed with the patient that I will change his double-J stent under anesthesia in the morning.  He is willing that I should go ahead and proceed with it.  Procedure and its alternatives were discussed with the patient.  His family has already left.     Oscar Hickman, M.D.     MIJ/MEDQ  D:  04/06/2013  T:  04/06/2013  Job:  [id number]

## 2013-04-06 NOTE — Brief Op Note (Signed)
04/05/2013 - 04/06/2013  11:47 AM  PATIENT:  Oscar Hickman  72 y.o. male  PRE-OPERATIVE DIAGNOSIS:  right hydronephrosis obstucted double j stent  POST-OPERATIVE DIAGNOSIS:  right hydronephrosis obstucted double j stent  PROCEDURE:  Procedure(s): CYSTOSCOPY WITH RETROGRADE PYELOGRAM/URETERAL STENT PLACEMENT (Right) CYSTOSCOPY WITH STENT REMOVAL (Right)  SURGEON:  Surgeon(s) and Role:    * Ky Barban, MD - Primary  PHYSICIAN ASSISTANT:   ASSISTANTS: none   ANESTHESIA: spinal  EBL:  Total I/O In: 875 [I.V.:875] Out: 700 [Urine:700]  BLOOD ADMINISTERED:none  DRAINS: double j stent   LOCAL MEDICATIONS USED:  NONE  SPECIMEN:  Urine cs from r renal pelvis  DISPOSITION OF SPECIMEN:  N/A  COUNTS:  YES  TOURNIQUET:  * No tourniquets in log *  DICTATION: .Other Dictation: Dictation Number [id number]  PLAN OF CARE: Admit for overnight observation  PATIENT DISPOSITION:  PACU - hemodynamically stable.   Delay start of Pharmacological VTE agent (>24hrs) due to surgical blood loss or risk of bleeding:

## 2013-04-06 NOTE — Anesthesia Procedure Notes (Signed)
Spinal  Patient location during procedure: OR Start time: 04/06/2013 10:07 AM Staffing CRNA/Resident: Glynn Octave E Preanesthetic Checklist Completed: patient identified, site marked, surgical consent, pre-op evaluation, timeout performed, IV checked, risks and benefits discussed and monitors and equipment checked Spinal Block Patient position: right lateral decubitus Prep: Betadine Patient monitoring: heart rate, cardiac monitor, continuous pulse ox and blood pressure Approach: right paramedian Location: L3-4 Injection technique: single-shot Needle Needle type: Spinocan  Needle gauge: 22 G Needle length: 9 cm Assessment Sensory level: T8 Additional Notes ATTEMPTS:1 TRAY ZO:[id number] TRAY EXPIRATION DATE:10/2013

## 2013-04-06 NOTE — Progress Notes (Signed)
Inpatient Diabetes Program Recommendations  AACE/ADA: New Consensus Statement on Inpatient Glycemic Control (2013)  Target Ranges:  Prepandial:   less than 140 mg/dL      Peak postprandial:   less than 180 mg/dL (1-2 hours)      Critically ill patients:  140 - 180 mg/dL   Results for Oscar Hickman, Oscar Hickman (MRN [id number]) as of 04/06/2013 08:18  Ref. Range 02/25/2012 09:56  Hemoglobin A1C Latest Range: <5.7 % 5.9 (H)    Inpatient Diabetes Program Recommendations HgbA1C: Please consider ordering an A1C.  Last A1C was 5.9% on 02/25/2012.  Thanks, Orlando Penner, RN, MSN, CCRN Diabetes Coordinator Inpatient Diabetes Program [phone]

## 2013-04-06 NOTE — Progress Notes (Signed)
[id number] 

## 2013-04-06 NOTE — Anesthesia Preprocedure Evaluation (Signed)
Anesthesia Evaluation  Patient identified by MRN, date of birth, ID band Patient awake    Reviewed: Allergy & Precautions, H&P , NPO status , Patient's Chart, lab work & pertinent test results, reviewed documented beta blocker date and time   Airway Mallampati: II TM Distance: >3 FB Neck ROM: Full    Dental  (+) Poor Dentition and Missing   Pulmonary COPD (acutely SOB this AM, ) + rhonchi   + wheezing      Cardiovascular hypertension, Pt. on medications + CAD and +CHF Rhythm:Regular Rate:Normal     Neuro/Psych  Headaches, Anxiety  Neuromuscular disease    GI/Hepatic   Endo/Other  diabetes, Type 2  Renal/GU Renal diseaseR ureteral stent occluded w/ hydronephrosis, pyelonephritis.Marland KitchenMarland KitchenThe patient has a history of prostate CA     Musculoskeletal   Abdominal   Peds  Hematology   Anesthesia Other Findings   Reproductive/Obstetrics                           Anesthesia Physical Anesthesia Plan  ASA: IV and emergent  Anesthesia Plan: Spinal   Post-op Pain Management:    Induction:   Airway Management Planned: Simple Face Mask  Additional Equipment:   Intra-op Plan:   Post-operative Plan:   Informed Consent: I have reviewed the patients History and Physical, chart, labs and discussed the procedure including the risks, benefits and alternatives for the proposed anesthesia with the patient or authorized representative who has indicated his/her understanding and acceptance.     Plan Discussed with: CRNA and Surgeon  Anesthesia Plan Comments: (Will proceed due to emergent nature of this case. Preop jet neb w/ albuterol, bp control with labetolol prior to starting.)        Anesthesia Quick Evaluation

## 2013-04-06 NOTE — Progress Notes (Signed)
[id number] 

## 2013-04-06 NOTE — Transfer of Care (Signed)
Immediate Anesthesia Transfer of Care Note  Patient: Oscar Hickman  Procedure(s) Performed: Procedure(s): CYSTOSCOPY WITH RETROGRADE PYELOGRAM/URETERAL STENT PLACEMENT (Right) CYSTOSCOPY WITH STENT REMOVAL (Right)  Patient Location: PACU  Anesthesia Type:Spinal  Level of Consciousness: awake, alert  and oriented  Airway & Oxygen Therapy: Patient Spontanous Breathing and Patient connected to face mask oxygen  Post-op Assessment: Report given to PACU RN and Post -op Vital signs reviewed and stable  Post vital signs: Reviewed and stable  Complications: No apparent anesthesia complications

## 2013-04-06 NOTE — Progress Notes (Signed)
Pt. Arrived to pre-op area from floor.  Pt. Had audible wheezes throughout, as having SOB, HR & BP was elevated.  VS were HR-96, BP-186/113, resp-37 & O2 sat 90 on RA.  O2 via NRB was applied @ 10L.  Sats up to 97-100%.  Breathing still loabored.  Dr. Jayme Cloud in to assess pt.Chest x-ray was obtained & albuterol neb was administered.  Pt. States "felt better" after breathing treatment.  Labetolol, Fentanyl, & Lasix were given as ordered.  Pt. States he feels "better" & breathing is not as labored.  Will continue to monitor.

## 2013-04-06 NOTE — Progress Notes (Signed)
Dr. Jerre Simon came in around 0030 and placed orders for cystoscopy stent placement this am and has requested the procedure be done at 0900. However, when I took the consent form into the patient and explained to him he needed to read and sign it he told me he could not read well. I offered to read it to him and he requested to wait for his son and wife to get here this am first.

## 2013-04-06 NOTE — Progress Notes (Signed)
NAME:  AHMADOU, BOLZ NO.:  [id number]  MEDICAL RECORD NO.:  [id number]  LOCATION:  [facility]PO                          FACILITY:  [facility]  PHYSICIAN:  Ky Barban, M.D.DATE OF BIRTH:  08/14/1941  DATE OF PROCEDURE: DATE OF DISCHARGE:                                PROGRESS NOTE   Mr. Halladay is 72 years old gentleman who was admitted last night with severe left hydronephrosis and prostate cancer, has a double-J stent on the right side and it apparently obstructed.  It is not draining very well.  We have brought him down stair to change the stent.  The patient is very short of breath.  Dr. Marcos Eke is given couple of treatments of inhalers and also give him Lasix.  He is breathing a little bit better, so he want me to do it as an emergency because of severe hydronephrosis and we need to change his stent, and he will be postoperatively kept in ICU.  I am going to place his family physician Dr. Janna Arch to follow him and consult him and follow him with me.     Ky Barban, M.D.     MIJ/MEDQ  D:  04/06/2013  T:  04/06/2013  Job:  [id number]

## 2013-04-06 NOTE — Consult Note (Signed)
Note [phone]

## 2013-04-06 NOTE — Progress Notes (Signed)
UR Chart Review Completed  

## 2013-04-06 NOTE — Anesthesia Postprocedure Evaluation (Signed)
  Anesthesia Post-op Note  Patient: Oscar Hickman  Procedure(s) Performed: Procedure(s): CYSTOSCOPY WITH RETROGRADE PYELOGRAM/URETERAL STENT PLACEMENT (Right) CYSTOSCOPY WITH STENT REMOVAL (Right)  Patient Location: PACU  Anesthesia Type:Spinal  Level of Consciousness: awake, alert  and oriented  Airway and Oxygen Therapy: Patient Spontanous Breathing and Patient connected to face mask oxygen  Post-op Pain: none  Post-op Assessment: Post-op Vital signs reviewed, Patient's Cardiovascular Status Stable, Respiratory Function Stable, Patent Airway and No signs of Nausea or vomiting  Post-op Vital Signs: Reviewed and stable  Complications: No apparent anesthesia complications

## 2013-04-07 LAB — DIFFERENTIAL
Lymphocytes Relative: 7 % — ABNORMAL LOW (ref 12–46)
Monocytes Absolute: 0.6 10*3/uL (ref 0.1–1.0)
Monocytes Relative: 7 % (ref 3–12)
Neutro Abs: 6.6 10*3/uL (ref 1.7–7.7)
Neutrophils Relative %: 83 % — ABNORMAL HIGH (ref 43–77)

## 2013-04-07 LAB — URINE CULTURE

## 2013-04-07 LAB — CBC
HCT: 30.3 % — ABNORMAL LOW (ref 39.0–52.0)
Hemoglobin: 9.5 g/dL — ABNORMAL LOW (ref 13.0–17.0)
Hemoglobin: 9.8 g/dL — ABNORMAL LOW (ref 13.0–17.0)
MCHC: 32 g/dL (ref 30.0–36.0)
RBC: 3.63 MIL/uL — ABNORMAL LOW (ref 4.22–5.81)
RDW: 16.5 % — ABNORMAL HIGH (ref 11.5–15.5)
WBC: 8 10*3/uL (ref 4.0–10.5)

## 2013-04-07 LAB — BASIC METABOLIC PANEL
GFR calc Af Amer: >90 mL/min (ref >90)
GFR calc non Af Amer: 82 mL/min — ABNORMAL LOW (ref >90)
Glucose, Bld: 161 mg/dL — ABNORMAL HIGH (ref 70–99)
Potassium: 3.4 mEq/L — ABNORMAL LOW (ref 3.5–5.1)
Sodium: 138 mEq/L (ref 135–145)

## 2013-04-07 LAB — GLUCOSE, CAPILLARY
Glucose-Capillary: 142 mg/dL — ABNORMAL HIGH (ref 70–99)
Glucose-Capillary: 279 mg/dL — ABNORMAL HIGH (ref 70–99)
Glucose-Capillary: 162 mg/dL — ABNORMAL HIGH (ref 70–99)

## 2013-04-07 MED ORDER — ENOXAPARIN SODIUM 40 MG/0.4ML ~~LOC~~ SOLN
40.0000 mg | SUBCUTANEOUS | Status: DC
  Administered 2013-04-07 – 2013-04-09 (×3): 40 mg via SUBCUTANEOUS
  Filled 2013-04-07 (×3): qty 0.4

## 2013-04-07 MED ORDER — POLYSACCHARIDE IRON COMPLEX 150 MG PO CAPS
150.0000 mg | ORAL_CAPSULE | Freq: Every day | ORAL | Status: DC
  Administered 2013-04-07 – 2013-04-09 (×3): 150 mg via ORAL
  Filled 2013-04-07 (×3): qty 1

## 2013-04-07 NOTE — Progress Notes (Signed)
Report called to Charna Busman, RN. Patient ready for transfer to 321 No acute distress noted.

## 2013-04-07 NOTE — Progress Notes (Signed)
NAME:  Oscar Hickman, Oscar Hickman NO.:  [id number]  MEDICAL RECORD NO.:  [id number]  LOCATION:  IC03                          FACILITY:  [facility]  PHYSICIAN:  Melvyn Novas, MDDATE OF BIRTH:  1941-07-29  DATE OF PROCEDURE: DATE OF DISCHARGE:                                PROGRESS NOTE   The patient has had ureteral stent placed yesterday for right-sided hydronephrosis with repeat procedure.  Currently, flow was re- established based on pyelogram done intraoperatively.  Hemoglobin 9.4, blood pressure 143/87, pulse 82, respiratory rate 26, O2 sat 96%.  The patient also has hypertension, cognitive dysfunction, chronic hyperlipidemia, and insulin-dependent diabetes.  The patient had fair glycemic control on Lantus 24 units at nighttime and NovoLog before meals t.i.d.  LUNGS:  Clear.  No rales, wheeze, or rhonchi. HEART:  Regular rhythm.  No S3, S4.  No heaves, thrills, or rubs. ABDOMEN:  Soft, nontender.  Bowel sounds normoactive.  No guarding, rebound, mass, or megaly.  No flank tenderness.  PLAN:  Right now is continue Rocephin.  Monitor renal function.  The patient may be transferred to the floor, and we will make further recommendations as the database expands.     Melvyn Novas, MD     RMD/MEDQ  D:  04/07/2013  T:  04/07/2013  Job:  [id number]

## 2013-04-07 NOTE — Progress Notes (Signed)
[id number] 

## 2013-04-07 NOTE — Progress Notes (Signed)
NAME:  Oscar Hickman, Oscar Hickman NO.:  [id number]  MEDICAL RECORD NO.:  [id number]  LOCATION:  IC03                          FACILITY:  [facility]  PHYSICIAN:  Melvyn Novas, MDDATE OF BIRTH:  [birth year]  DATE OF PROCEDURE: DATE OF DISCHARGE:                                PROGRESS NOTE   HISTORY:  The patient is a 72 year old black male with diminished cognitive functioning, admitted for recurrent left-sided hydronephrosis, UTI, possible pyelonephritis after 2 nonfunctioning J stents were placed on that side.  The patient likewise has a history of insulin-dependent diabetes, hypertension, diminished cognition which is chronic, hyperlipidemia, and chronic noncompliance with medicines.  The patient currently undergoing surgical implantation of a new stent in the ureter.  PAST SURGICAL HISTORY:  Remarkable for TURP of the prostate, cardiac catheterization in 2010, revealing minimal nonobstructive coronary disease.  PHYSICAL EXAMINATION:  LUNGS:  No rales, wheeze, or rhonchi. HEART:  Regular rhythm.  No murmurs, gallops, or rubs. ABDOMEN:  Soft, nontender.  Bowel sounds normoactive.  No guarding, rebound, mass, or megaly. VITAL SIGNS:  Blood pressure is 181/98, temperature 98.7, pulse 86 and regular, respiratory rate is 32.  LABORATORY DATA:  Creatinine is 1.02.  Potassium is 3.6.  PLAN:  Right now is to continue intravenous antibiotics in the form of Rocephin.  Continue and resume home medicines in the form of Norvasc 10 p.o. daily, carvedilol 25 p.o. b.i.d., hydralazine 50 p.o. t.i.d., Lantus 24 units subcu nightly, Isordil 20 mg p.o. t.i.d., lisinopril 40 mg p.o. daily.  Monitor renal function on a daily basis.     Melvyn Novas, MD     RMD/MEDQ  D:  04/06/2013  T:  04/07/2013  Job:  [id number]

## 2013-04-07 NOTE — Progress Notes (Signed)
ANTICOAGULATION CONSULT NOTE - Initial Consult  Pharmacy Consult for Lovenox Indication: VTE prophylaxis  No Known Allergies  Patient Measurements: Height: 5\' 5"  (165.1 cm) Weight: 204 lb 12.9 oz (92.9 kg) IBW/kg (Calculated) : 61.5  Vital Signs: Temp: 100 F (37.8 C) (05/31 0400) Temp src: Oral (05/31 0400) BP: 143/87 mmHg (05/31 0600) Pulse Rate: 82 (05/31 0600)  Labs:  Recent Labs  04/05/13 1233 04/06/13 0525 04/07/13 0442  HGB 10.1*  --  9.5*  HCT 31.2*  --  29.7*  PLT 311  --  347  CREATININE 0.94 1.02 0.97  TROPONINI <0.30  --   --     Estimated Creatinine Clearance: 74.3 ml/min (by C-G formula based on Cr of 0.97).   Medical History: Past Medical History  Diagnosis Date  . Skin infection     history  . HTN (hypertension) 11/23/2011  . High cholesterol   . Diabetes mellitus   . Chronic headache     daily  . Anxiety   . Osteoarthritis   . COPD (chronic obstructive pulmonary disease)     patient denies  . Peripheral neuropathy   . 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 CHF (congestive heart failure)   . Medical non-compliance   . Heart disease     Medications:  Scheduled:  . albuterol  2.5 mg Nebulization Q6H   And  . ipratropium  0.5 mg Nebulization Q6H  . amLODipine  10 mg Oral Daily  . aspirin EC  81 mg Oral Daily  . carvedilol  25 mg Oral BID WC  .  ceFAZolin (ANCEF) IV  1 g Intravenous Q8H  . cefTRIAXone (ROCEPHIN)  IV  1 g Intravenous Q24H  . enoxaparin (LOVENOX) injection  40 mg Subcutaneous Q24H  . hydrALAZINE  50 mg Oral TID  . insulin aspart  0-5 Units Subcutaneous QHS  . insulin aspart  0-9 Units Subcutaneous TID WC  . insulin glargine  24 Units Subcutaneous Daily  . iron polysaccharides  150 mg Oral Daily  . isosorbide dinitrate  20 mg Oral TID  . lisinopril  40 mg Oral Daily  . predniSONE  5 mg Oral BID  . sodium chloride  3 mL Intravenous Q12H    Assessment: Lovenox for VTE px while  hospitalized.   Renal function ok.  Goal of Therapy:  Monitor platelets by anticoagulation protocol: Yes   Plan:  Lovenox 40mg  sq daily  Elson Clan 04/07/2013,8:26 AM

## 2013-04-08 LAB — URINE CULTURE

## 2013-04-08 LAB — BASIC METABOLIC PANEL
BUN: 14 mg/dL (ref 6–23)
CO2: 33 mEq/L — ABNORMAL HIGH (ref 19–32)
Calcium: 8.7 mg/dL (ref 8.4–10.5)
Creatinine, Ser: 0.89 mg/dL (ref 0.50–1.35)
GFR calc Af Amer: >90 mL/min (ref >90)

## 2013-04-08 LAB — GLUCOSE, CAPILLARY
Glucose-Capillary: 160 mg/dL — ABNORMAL HIGH (ref 70–99)
Glucose-Capillary: 146 mg/dL — ABNORMAL HIGH (ref 70–99)
Glucose-Capillary: 169 mg/dL — ABNORMAL HIGH (ref 70–99)

## 2013-04-08 MED ORDER — MUPIROCIN 2 % EX OINT
1.0000 "application " | TOPICAL_OINTMENT | Freq: Two times a day (BID) | CUTANEOUS | Status: DC
  Administered 2013-04-08 – 2013-04-09 (×3): 1 via NASAL
  Filled 2013-04-08 (×2): qty 22

## 2013-04-08 MED ORDER — CHLORHEXIDINE GLUCONATE CLOTH 2 % EX PADS
6.0000 | MEDICATED_PAD | Freq: Every day | CUTANEOUS | Status: DC
  Administered 2013-04-08: 6 via TOPICAL

## 2013-04-08 NOTE — Progress Notes (Signed)
YNWG#[id number]

## 2013-04-08 NOTE — Progress Notes (Signed)
[id number] 

## 2013-04-08 NOTE — Progress Notes (Signed)
ZOXW#[id number]

## 2013-04-08 NOTE — Progress Notes (Signed)
NAME:  MAJD, TISSUE NO.:  [id number]  MEDICAL RECORD NO.:  [id number]  LOCATION:  A321                          FACILITY:  [facility]  PHYSICIAN:  Melvyn Novas, MDDATE OF BIRTH:  1941/08/31  DATE OF PROCEDURE: DATE OF DISCHARGE:                                PROGRESS NOTE   The patient had ureteral stents placed for right-sided recurrent hydronephrosis.  Intraoperative visualization appears to be functioning. Creatinine today is 0.89.  The patient has chronic anemia, iron deficiency, currently on iron supplementation.  Has hypertension well controlled.  Blood pressure 139/78, temperature 97.5, pulse 75 and regular, respiratory rate is 20, hemoglobin is stable at 9.8, has indwelling Foley.  Currently, on Rocephin empirically on admission. Afebrile.  Lungs show prolonged inspiratory and expiratory phase.  No rales, wheeze, or rhonchi appreciable.  Heart, regular rate and rhythm. No murmurs, gallops, or rubs.  Abdomen is essentially benign.  PLAN:  Right now is to continue current therapy.  Consider early discharge tomorrow if urology agrees and monitor BMET in the a.m.     Melvyn Novas, MD     RMD/MEDQ  D:  04/08/2013  T:  04/08/2013  Job:  [id number]

## 2013-04-08 NOTE — Progress Notes (Signed)
NAME:  Oscar Hickman, Oscar Hickman             ACCOUNT NO.:  [id number]  MEDICAL RECORD NO.:  [id number]  LOCATION:  A321                          FACILITY:  [facility]  PHYSICIAN:  Ky Barban, M.D.DATE OF BIRTH:  1940-12-11  DATE OF PROCEDURE:  04/08/2013 DATE OF DISCHARGE:                                PROGRESS NOTE   This patient has a Foley catheter.  I just wrote the order to discontinue it tonight.  If he is voiding okay, he can be discharged home.  As per Dr. Janna Arch his family physician, and he was having pain in his right flank preoperatively after the stent.  He is not having any pain.  He is afebrile and other problem I see was his urine culture which was collected from the right renal pelvis showing Staphylococcus aureus over 100,000 colonies and sensitivities do not show Rocephin that he is taking right now.  Wether it is sensitive to it or not, but clinically he is responding very well.  He is afebrile.  He can be discharged home on Bactrim DS one p.o. b.i.d. for 7 days.  We will see him back in the office in 2 weeks.     Ky Barban, M.D.     MIJ/MEDQ  D:  04/08/2013  T:  04/08/2013  Job:  [id number]

## 2013-04-08 NOTE — Progress Notes (Signed)
NAME:  Oscar Hickman, Oscar Hickman NO.:  [id number]  MEDICAL RECORD NO.:  [id number]  LOCATION:  A321                          FACILITY:  [facility]  PHYSICIAN:  Ky Barban, M.D.DATE OF BIRTH:  Apr 03, 1941  DATE OF PROCEDURE:  04/08/2013 DATE OF DISCHARGE:                                PROGRESS NOTE   This patient is a gentleman who has prostate cancer with obstruction of the distal right ureter, and I had put a double-J stent on the right side in June 2011.  He never came back for follow up, and now the hydronephrosis became worse and the stent was completely blocked, so I did remove the stent.  He had callus stones around the stent and distal ureter was showing obstruction.  I was able to take the old stent out with difficulty and put a new stent in and emptied all this hydronephrotic right kidney, and collected urine from the right renal pelvis sent for culture.  The culture report is showing Staphylococcus aureus urine over 100,000 colonies.  He is taking Rocephin, but it is not showing on the sensitivity report whether it is sensitive or not, but it is helping him.  He is completely afebrile and feeling better. His creatinine is much better, it is 0.89.  BUN 14, sodium 140, potassium 4, chloride 104, CO2 of 33.  His serum glucose slightly elevated to 154.  Now, he still has Foley catheter.  I will take the Foley catheter tomorrow, if he is afebrile and doing well.  It is up to his family physician, if he wants to discharge him home.  I must see him back in the office in a couple weeks.  At that time, I want to do a renal ultrasound to see what is happening with this hydronephrosis on the right side.  I have asked the patient to come after 2 weeks.     Ky Barban, M.D.     MIJ/MEDQ  D:  04/08/2013  T:  04/08/2013  Job:  [id number]

## 2013-04-08 NOTE — Anesthesia Postprocedure Evaluation (Signed)
  Anesthesia Post-op Note  Patient: Oscar Hickman  Procedure(s) Performed: Procedure(s): CYSTOSCOPY WITH RETROGRADE PYELOGRAM/URETERAL STENT PLACEMENT (Right) CYSTOSCOPY WITH STENT REMOVAL (Right)  Patient Location: Room 321  Anesthesia Type:Spinal  Level of Consciousness: awake, alert , oriented and patient cooperative  Airway and Oxygen Therapy: Patient Spontanous Breathing and Patient connected to nasal cannula oxygen  Post-op Pain: mild  Post-op Assessment: Post-op Vital signs reviewed, Patient's Cardiovascular Status Stable, Respiratory Function Stable, Patent Airway, No signs of Nausea or vomiting and Pain level controlled  Post-op Vital Signs: Reviewed and stable  Complications: No apparent anesthesia complications

## 2013-04-09 ENCOUNTER — Encounter (HOSPITAL_COMMUNITY): Payer: Self-pay | Admitting: Urology

## 2013-04-09 LAB — BASIC METABOLIC PANEL
CO2: 32 mEq/L (ref 19–32)
Calcium: 8.7 mg/dL (ref 8.4–10.5)
Creatinine, Ser: 0.87 mg/dL (ref 0.50–1.35)
GFR calc non Af Amer: 85 mL/min — ABNORMAL LOW (ref >90)

## 2013-04-09 LAB — GLUCOSE, CAPILLARY

## 2013-04-09 MED ORDER — POLYSACCHARIDE IRON COMPLEX 150 MG PO CAPS: 150.0000 mg | ORAL_CAPSULE | Freq: Every day | ORAL | Status: DC

## 2013-04-09 NOTE — Care Management Note (Signed)
    Page 1 of 1   04/09/2013     2:01:36 PM   CARE MANAGEMENT NOTE 04/09/2013  Patient:  Oscar Hickman, Oscar Hickman   Account Number:  [id number]  Date Initiated:  04/09/2013  Documentation initiated by:  Sharrie Rothman  Subjective/Objective Assessment:   Pt admitted from home with hydronephrosis. Pt lives with his wife and has a grandson who lives with him. Pt has used HH in the past and would like one to come at discharge for short term. Pt is fairly indpendent with ADL's.     Action/Plan:   Pt chose [facility] for RN. Alroy Bailiff of [facility] is aware and will collect the pts information from the chart. HH services to start within 48 hours. No DME needs noted.   Anticipated DC Date:  04/10/2013   Anticipated DC Plan:  HOME W HOME HEALTH SERVICES      DC Planning Services  CM consult      Choice offered to / List presented to:          [facility] arranged  HH-1 RN      [facility] agency  [facility]   Status of service:  Completed, signed off Medicare Important Message given?  YES (If response is "NO", the following Medicare IM given date fields will be blank) Date Medicare IM given:  04/09/2013 Date Additional Medicare IM given:    Discharge Disposition:  HOME W HOME HEALTH SERVICES  Per UR Regulation:    If discussed at Long Length of Stay Meetings, dates discussed:    Comments:  04/09/13 1400 Arlyss Queen, RN BSN CM Pt discharged home. HH services to start within 48 hours.  04/09/13 1330 Arlyss Queen, RN BSN CM

## 2013-04-09 NOTE — Op Note (Signed)
NAME:  Oscar Hickman, Oscar Hickman             ACCOUNT NO.:  [id number]  MEDICAL RECORD NO.:  [id number]  LOCATION:                                 FACILITY:  PHYSICIAN:  Ky Barban, M.D.DATE OF BIRTH:  Jun 25, 1941  DATE OF PROCEDURE:  04/06/2013 DATE OF DISCHARGE:                              OPERATIVE REPORT   PREOPERATIVE DIAGNOSES: 1. Prostate cancer. 2. Right ureteral obstruction. 3. Encrusted right ureteral stent removal with considerable     difficulty.  DESCRIPTION OF PROCEDURE:  The patient under spinal anesthesia in lithotomy position, after usual prep and drape, #25 cystoscope introduced into the bladder.  Right ureteral orifice was seen, the stent and is beyond the sphincter, it was grabbed and try to remove it, but it got stuck near the distal ureter near the pelvic brim, and it will not come out, so I was able to pass a guidewire up into the renal pelvis over the Glidewire.  I was able to pass #7 open-end catheter and injected the dye into the renal pelvis which was markedly dilated and I left the catheter in to drain.  After removing the guidewire, I can see there is severe hydronephrotic drip which was collected for urine culture.  Now, I put a balloon dilator #15 over the guidewire into the distal ureter, dilated that.  Then, I tried to remove the stent and it came out luckily and I think there are encrustation stones around it. So, I tried to put an ureteroscope, __________ not go beyond the pelvic brim, although ureter is markedly dilated above that.  There are some small calculi seen in the distal ureter, and I was able to wash them off.  The hydronephrotic drip drained considerable amount of urine. Now, I passed a second guidewire over the open-end catheter #5 and injected the dye and it was in the right place.  I removed the open- ended catheter, left the guidewire in and over the guidewire, I was able to pass #5 double-J stent through the cystoscope,  positioned it within the renal pelvis and the bladder.  After that, I can see the hydronephrotic drip as almost ceased and the urine has completely drained out from the renal pelvis, so I decided to take out the large number of it.  A #7 open-end catheter taken out.  The double-J stent with a nice loop in the bladder and the renal pelvis is still there. All the instruments are removed.  Inspected the bladder and looks grossly normal.  The patient left the operating room in satisfactory condition.  It was a complicated case, so I think we should use 22 code to describe it.     Ky Barban, M.D.     MIJ/MEDQ  D:  04/06/2013  T:  04/06/2013  Job:  [id number]

## 2013-04-10 NOTE — Discharge Summary (Signed)
NAME:  Oscar Hickman, RYLEE NO.:  [id number]  MEDICAL RECORD NO.:  [id number]  LOCATION:  A321                          FACILITY:  [facility]  PHYSICIAN:  Melvyn Novas, MDDATE OF BIRTH:  [birth year]  DATE OF ADMISSION:  04/05/2013 DATE OF DISCHARGE:  06/02/2014LH                              DISCHARGE SUMMARY   The patient is a 72 year old black male with history of cognitive dysfunction, insulin-dependent diabetes, hypertension, hyperlipidemia, nonobstructive coronary artery disease, and history of right ureteral stent for hydronephrosis previously.  The patient was again admitted with some right flank pain, some hydronephrosis, stent was not functioning.  He was seen promptly by Urology, who within 36 hours reinserted another stent in the ureter.  The 1 in the collecting system was apparently operable and functioning.  He was hemodynamically stable for hospital, continued his hypertension, anti-lipidemic drugs as well as his insulin.  Glycemic control was fair while in the hospital, and he was placed empirically on Rocephin upon admission.  It was felt after 48 hours postoperatively, he had normal renal function and have reached maximum benefit of his hospital stay.  He was subsequently discharged on the following medicines.  Niferex 150 mg p.o. daily, Zytiga 250 mg p.o. daily, Norvasc 10 mg p.o. daily, aspirin 81 mg daily, Coreg 25 mg p.o. b.i.d., hydralazine 50 mg p.o. t.i.d., Lantus insulin 24 units subcu at bedtime, Isordil 20 mg p.o. daily in a.m., metformin 500 mg p.o. b.i.d., and Accupril 40 mg p.o. daily.  He was told to stop taking prednisone 5 mg per day, which was given for inexplicable reasons at least to me. The patient will follow up in the office in 2-3 days' time for assessment of renal function and electrolytes as well as glycemic control, and he will also follow up with Urology.     Melvyn Novas, MD     RMD/MEDQ  D:   04/09/2013  T:  04/10/2013  Job:  [id number]

## 2013-04-12 LAB — CULTURE, BLOOD (ROUTINE X 2)

## 2013-04-16 ENCOUNTER — Ambulatory Visit (HOSPITAL_COMMUNITY): Payer: Medicare Other

## 2013-04-16 ENCOUNTER — Other Ambulatory Visit (HOSPITAL_COMMUNITY): Payer: Self-pay | Admitting: Oncology

## 2013-04-16 ENCOUNTER — Other Ambulatory Visit (HOSPITAL_COMMUNITY): Payer: Medicare Other

## 2013-04-16 DIAGNOSIS — C61 Malignant neoplasm of prostate: Secondary | ICD-10-CM

## 2013-04-16 MED ORDER — PREDNISONE 5 MG PO TABS: 5.0000 mg | ORAL_TABLET | Freq: Two times a day (BID) | ORAL | Status: DC

## 2013-04-19 ENCOUNTER — Encounter (HOSPITAL_COMMUNITY): Payer: Medicare Other | Attending: Hematology and Oncology

## 2013-04-19 ENCOUNTER — Encounter (HOSPITAL_BASED_OUTPATIENT_CLINIC_OR_DEPARTMENT_OTHER): Payer: Medicare Other

## 2013-04-19 VITALS — BP 143/86 | HR 83 | Temp 97.4°F | Resp 20 | Wt 205.6 lb

## 2013-04-19 DIAGNOSIS — C61 Malignant neoplasm of prostate: Secondary | ICD-10-CM

## 2013-04-19 DIAGNOSIS — C7982 Secondary malignant neoplasm of genital organs: Secondary | ICD-10-CM

## 2013-04-19 DIAGNOSIS — D649 Anemia, unspecified: Secondary | ICD-10-CM

## 2013-04-19 DIAGNOSIS — H6120 Impacted cerumen, unspecified ear: Secondary | ICD-10-CM | POA: Insufficient documentation

## 2013-04-19 DIAGNOSIS — Z5111 Encounter for antineoplastic chemotherapy: Secondary | ICD-10-CM

## 2013-04-19 DIAGNOSIS — H6123 Impacted cerumen, bilateral: Secondary | ICD-10-CM

## 2013-04-19 LAB — COMPREHENSIVE METABOLIC PANEL
Albumin: 2.9 g/dL — ABNORMAL LOW (ref 3.5–5.2)
Alkaline Phosphatase: 90 U/L (ref 39–117)
BUN: 16 mg/dL (ref 6–23)
Calcium: 8.8 mg/dL (ref 8.4–10.5)
GFR calc Af Amer: >90 mL/min (ref >90)
Glucose, Bld: 124 mg/dL — ABNORMAL HIGH (ref 70–99)
Potassium: 3.3 mEq/L — ABNORMAL LOW (ref 3.5–5.1)
Total Protein: 6.6 g/dL (ref 6.0–8.3)

## 2013-04-19 LAB — DIFFERENTIAL
Lymphocytes Relative: 22 % (ref 12–46)
Lymphs Abs: 1.2 10*3/uL (ref 0.7–4.0)
Monocytes Relative: 10 % (ref 3–12)
Neutro Abs: 3.4 10*3/uL (ref 1.7–7.7)
Neutrophils Relative %: 64 % (ref 43–77)

## 2013-04-19 LAB — CBC
MCV: 84 fL (ref 78.0–100.0)
RBC: 3.68 MIL/uL — ABNORMAL LOW (ref 4.22–5.81)
RDW: 17.3 % — ABNORMAL HIGH (ref 11.5–15.5)
WBC: 5.4 10*3/uL (ref 4.0–10.5)

## 2013-04-19 MED ORDER — CARBAMIDE PEROXIDE 6.5 % OT SOLN: 5.0000 [drp] | Freq: Two times a day (BID) | OTIC | Status: DC

## 2013-04-19 MED ORDER — LEUPROLIDE ACETATE (3 MONTH) 22.5 MG IM KIT
22.5000 mg | PACK | Freq: Once | INTRAMUSCULAR | Status: AC
  Administered 2013-04-19: 22.5 mg via INTRAMUSCULAR
  Filled 2013-04-19: qty 22.5

## 2013-04-19 NOTE — Progress Notes (Signed)
Labs drawn today for cbc/diff,cmp,psa 

## 2013-04-19 NOTE — Progress Notes (Addendum)
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 Indwelling Foley catheter removed. S/P Depo-Lupro and Casodex. 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 Zytiga and Prednisone 5 mg BID starting on 07/06/2012 .   INTERVAL HISTORY:   Oscar Hickman 72 y.o. male returns to the clinic today for scheduled follow up.His last documented injection of Lupron was in 05/04/2012.  His PSA has declined from 10.56 in Sept 2013  to 2.37 in March, today's values pending .  He was recently hospitalized from 04/05/2013 to 04/09/2013, and according to his discharge summary,"The patient was again admitted  with some right flank pain, some hydronephrosis, stent was not functioning. He was seen promptly by Urology, who within 36 hours reinserted another stent in the ureter"  He is accompanied by his wife and grandchild today, he denies any no new problem, except for concern of clogged ears which he thinks is most likely from wax.  He denies any increased shortness or breath, no pain  Or difficulty passing urine. He  states that he still mildly incontinent of urine but generally able to pass urine.his weight is essentially stable from last visit on March 2014.  MEDICAL HISTORY: Past Medical History  Diagnosis Date  . Skin infection     history  . HTN (hypertension) 11/23/2011  . High cholesterol   . Diabetes mellitus   . Chronic headache     daily  . Anxiety   . Osteoarthritis   . COPD (chronic obstructive pulmonary disease)     patient denies  . Peripheral neuropathy   . Prostate  cancer 2009    s/p resection and radiation, metatstatic  . CHF (congestive heart failure)   . Medical non-compliance   . Heart disease     ALLERGIES:  has No Known Allergies.  MEDICATIONS:  Current Outpatient Prescriptions  Medication Sig Dispense Refill  . abiraterone Acetate (ZYTIGA) 250 MG tablet Take 4 tablets (1,000 mg total) by mouth daily. Take on an empty stomach 1 hour before or 2 hours after a meal  120 tablet  0  . amLODipine (NORVASC) 10 MG tablet Take 10 mg by mouth daily.      Marland Kitchen aspirin EC 81 MG tablet Take 81 mg by mouth daily.      . carvedilol (COREG) 25 MG tablet Take 25 mg by mouth 2 (two) times daily with a meal.      . hydrALAZINE (APRESOLINE) 50 MG tablet Take 50 mg by mouth 3 (three) times daily.      . insulin glargine (LANTUS) 100 UNIT/ML injection Inject 24 Units into the skin daily.       . iron polysaccharides (NIFEREX) 150 MG capsule Take 1 capsule (150 mg total) by mouth daily.  30 capsule  4  . isosorbide dinitrate (ISORDIL) 20 MG tablet Take 20 mg by mouth 3 (three) times daily.       . metFORMIN (GLUCOPHAGE) 500 MG tablet Take 500 mg by mouth 2 (two) times daily with a meal.        . predniSONE (DELTASONE) 5 MG tablet Take 1 tablet (5 mg total) by mouth 2 (two) times  daily.  60 tablet  0  . quinapril (ACCUPRIL) 40 MG tablet Take 40 mg by mouth daily.        . carbamide peroxide (DEBROX) 6.5 % otic solution Place 5 drops into both ears 2 (two) times daily.  15 mL  0   Current Facility-Administered Medications  Medication Dose Route Frequency Provider Last Rate Last Dose  . leuprolide (LUPRON) injection 22.5 mg  22.5 mg Intramuscular Once Sherral Hammers, MD        SURGICAL HISTORY:  Past Surgical History  Procedure Laterality Date  . Transurethral resection of prostate  03/2008  . Cardiac catheterization  02/2009    minimal non-obstructive CAD  . Cystoscopy w/ ureteral stent placement Right 04/06/2013    Procedure: CYSTOSCOPY WITH RETROGRADE  PYELOGRAM/URETERAL STENT PLACEMENT;  Surgeon: Ky Barban, MD;  Location: [facility];  Service: Urology;  Laterality: Right;  . Cystoscopy w/ ureteral stent removal Right 04/06/2013    Procedure: CYSTOSCOPY WITH STENT REMOVAL;  Surgeon: Ky Barban, MD;  Location: [facility];  Service: Urology;  Laterality: Right;     REVIEW OF SYSTEMS: 14 point review of system is as in the history above otherwise negative.    PHYSICAL EXAMINATION:  Blood pressure 143/86, pulse 83, temperature 97.4 F (36.3 C), temperature source Oral, resp. rate 20, weight 205 lb 9.6 oz (93.26 kg). GENERAL: No distress, well nourished.  SKIN:  No rashes or significant lesions  HEAD: Normocephalic, No masses, lesions, tenderness or abnormalities  EYES: Conjunctiva are pink and non-injected  ENT: External ears normal ,cortex turn out of the premiere to this was completely blocked by thickened wax. LYMPH: No palpable lymphadenopathy,the neck or supraclavicular areas.  BREAST:some gynecomastia. LUNGS: clear to auscultation , no crackles or wheezes HEART: regular rate & rhythm, no murmurs, no gallops, S1 normal and S2 normal  ABDOMEN: Abdomen soft, non-tender, normal bowel sounds, no masses or organomegaly and no hepatosplenomegaly  MSK: No CVA tenderness and no tenderness on percussion of the back or rib cage. EXTREMITIES: No significant edema, no skin discoloration or tenderness NEURO: alert & oriented , no focal motor/sensory deficits.     LABORATORY DATA: Lab Results  Component Value Date   WBC 5.4 04/19/2013   HGB 9.9* 04/19/2013   HCT 30.9* 04/19/2013   MCV 84.0 04/19/2013   PLT 399 04/19/2013      Chemistry      Component Value Date/Time   NA 141 04/19/2013 1015   K 3.3* 04/19/2013 1015   CL 105 04/19/2013 1015   CO2 29 04/19/2013 1015   BUN 16 04/19/2013 1015   CREATININE 0.93 04/19/2013 1015      Component Value Date/Time   CALCIUM 8.8 04/19/2013 1015   ALKPHOS 90 04/19/2013 1015   AST 13 04/19/2013  1015   ALT 9 04/19/2013 1015   BILITOT 0.3 04/19/2013 1015       RADIOGRAPHIC STUDIES: Ct Abdomen Pelvis Wo Contrast  04/05/2013   *RADIOLOGY REPORT*  Clinical Data: Right flank pain  CT ABDOMEN AND PELVIS WITHOUT CONTRAST  Technique:  Multidetector CT imaging of the abdomen and pelvis was performed following the standard protocol without intravenous contrast.  Comparison: 04/20/2012  Findings: Severe right hydronephrosis is worse.  There is marked right perinephric stranding again, indicating and evidence of obstruction.  The right ureteral stent is stable in position.  The proximal and is coiled in the right renal pelvis.  The distal and is coiled within the urethra well beyond the expected  location of the prostate gland which has been removed.  No evidence of urinary calculus.  Minimal left perinephric stranding is stable.  Dependent atelectasis at the lung bases.  The heart is markedly enlarged.  Aortic valve calcifications.  LAD and right coronary artery calcifications.  Mitral annular calcification.  Small hiatal hernia.  Unenhanced liver, gallbladder, spleen, pancreas, and adrenal glands are within normal limits.  No evidence of free fluid.  Bladder is decompressed.  Umbilical hernia contains only adipose tissue.  There is extensive stranding surrounding the length of the ureter extending into the pelvis.  Left inguinal hernia contains only adipose tissue.  Atherosclerotic calcifications of the aorta and visceral vasculature.  Unenhanced imaging of the vessels demonstrates ectasia of the SMA.  It is 14 mm in caliber.  Celiac is also mildly ectatic.  No destructive bone lesion or vertebral body fracture.  Sclerotic focus in the left side of the L4 vertebral body is stable.  IMPRESSION: There is severe hydronephrosis and prominent perinephric stranding involving the right kidney and ureter.  The right ureteral stent is in place at these findings are compatible with an element of obstruction.  The distal  end of the ureteral stent is in the urethra and may not be functioning optimally.  Chronic findings are stable.   Original Report Authenticated By: Jolaine Click, M.D.   Ct Head Wo Contrast  04/05/2013   *RADIOLOGY REPORT*  Clinical Data: Headache  CT HEAD WITHOUT CONTRAST  Technique:  Contiguous axial images were obtained from the base of the skull through the vertex without contrast.  Comparison: 12/04/2011  Findings: Moderate global atrophy.  Little if any chronic ischemic changes in the periventricular white matter.  Small lacunar infarct in the right cerebellar hemisphere on image 6 is stable.  No mass effect, midline shift, or acute intracranial hemorrhage.  Mastoid air cells are clear.  Partial opacification of posterior ethmoid air cells.  Remainder of the visualized paranasal sinuses are clear.  Cerumen in both external auditory canals.  Intact cranium.  IMPRESSION: No acute intracranial pathology.   Original Report Authenticated By: Jolaine Click, M.D.   Dg Retrograde Pyelogram  04/06/2013   *RADIOLOGY REPORT*  Clinical data:   Right hydronephrosis.  INTRAOPERATIVE RIGHT RETROGRADE UROGRAPHY  Comparison:  CT dated 04/05/2013  Technique:  Images were obtained with the C-arm fluoroscopic device intraoperatively and submitted for interpretation post-operatively. Please see the procedural report for the amount of contrast and the fluoroscopy time utilized.  Findings:  Intraoperative images show cystoscopic cannulation of the right ureter followed by balloon dilatation of the distal ureter and placement of a new ureteral stent that extends into the upper collecting system superiorly and inferiorly to the level of the bladder.  IMPRESSION: Intraoperative imaging during the right-sided ureteral balloon dilatation and placement of new right ureteral stent.   Original Report Authenticated By: Irish Lack, M.D.   Dg Chest Port 1 View  04/06/2013   *RADIOLOGY REPORT*  Clinical Data: Preoperative examination,  shortness of breath  PORTABLE CHEST - 1 VIEW  Comparison: 04/05/2013; 11/22/2012; 03/06/2013; 08/31/2009  Findings: Grossly unchanged enlarged cardiac silhouette and mediastinal contours with mild tortuosity/slight ectasia of the thoracic aorta.  Atherosclerotic calcifications within the aortic arch.  There is grossly unchanged  enlargement of the central pulmonary vasculature with peripheral pruning.  Pulmonary vasculature is indistinct with cephalization of flow.  Fluid is seen within the right minor fissure.  No definite pleural effusion or pneumothorax.  Unchanged bones.  IMPRESSION: 1.  Suspected mild  pulmonary edema. 2.  Persistent findings of cardiomegaly with enlargement of the central pulmonary vasculature, nonspecific but could be seen in the setting of pulmonary arterial hypertension.  Further evaluation with cardiac echo may be obtained as clinically indicated.   Original Report Authenticated By: Tacey Ruiz, MD   Dg Chest Portable 1 View  04/05/2013   *RADIOLOGY REPORT*  Clinical Data: Headache, weakness, shortness of breath.  PORTABLE CHEST - 1 VIEW  Comparison: 11/22/2012  Findings: Cardiomegaly.  Prominent central pulmonary vasculature suggests possibility of pulmonary arterial hypertension.  Findings are similar to prior study.  No confluent airspace opacities or overt edema.  No effusions or acute bony abnormality.  IMPRESSION: Cardiomegaly.  Probable pulmonary arterial hypertension.  Findings similar to prior study.   Original Report Authenticated By: Charlett Nose, M.D.     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  His PSA is declining  With Zytiga which he is tolerating very well.  His PSA today is still pending.liver enzymes look good the patient is mildly anemic but stable. 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 It is not clear why patient  was taken off Lupron which is to be continued in to maintain castrate testosterone level even on progression.  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  PLAN:  1. Patient would continue Zytiga and prednisone. 2. I resumed depot Lupron every 3 months to be continued indefinitely. 3. I recommended the Debrox to use for wax remover and if not that I discuss this problem with primary care physician at the upcoming clinic visit for possible ENT referral and possible syringing. 4. Return to clinic in 3 months.  PSA, CBC and CMP have been Ordered at that time    All questions were satisfactorily answered.  I spent 15 minutes counseling the patient face to face. The total time spent in the appointment was 30 minutes.   Sherral Hammers, MD FACP. Hematology/Oncology.

## 2013-04-19 NOTE — Patient Instructions (Signed)
.  [facility] Discharge Instructions  RECOMMENDATIONS MADE BY THE CONSULTANT AND ANY TEST RESULTS WILL BE SENT TO YOUR REFERRING PHYSICIAN.  EXAM FINDINGS BY THE PHYSICIAN TODAY AND SIGNS OR SYMPTOMS TO REPORT TO CLINIC OR PRIMARY PHYSICIAN: Dr. Beckie Busing recommends continuing lupron shot along with your other meds.    MEDICATIONS PRESCRIBED:  lupron today and every 3 months  INSTRUCTIONS GIVEN AND DISCUSSED: Labs, injection and Dr's visit in 3 months  SPECIAL INSTRUCTIONS/FOLLOW-UP: 3 month dr visit  Thank you for choosing [facility] to provide your oncology and hematology care.  To afford each patient quality time with our providers, please arrive at least 15 minutes before your scheduled appointment time.  With your help, our goal is to use those 15 minutes to complete the necessary work-up to ensure our physicians have the information they need to help with your evaluation and healthcare recommendations.    Effective January 1st, 2014, we ask that you re-schedule your appointment with our physicians should you arrive 10 or more minutes late for your appointment.  We strive to give you quality time with our providers, and arriving late affects you and other patients whose appointments are after yours.    Again, thank you for choosing [facility].  Our hope is that these requests will decrease the amount of time that you wait before being seen by our physicians.       _____________________________________________________________  Should you have questions after your visit to [facility], please contact our office at [phone] between the hours of 8:30 a.m. and 5:00 p.m.  Voicemails left after 4:30 p.m. will not be returned until the following business day.  For prescription refill requests, have your pharmacy contact our office with your prescription refill request.

## 2013-04-19 NOTE — Progress Notes (Signed)
Oscar Hickman presents today for injection per MD orders. lupron 22.5 mg administered IM in right Gluteal. Administration without incident. Patient tolerated well.

## 2013-04-20 ENCOUNTER — Other Ambulatory Visit (HOSPITAL_COMMUNITY): Payer: Self-pay | Admitting: Oncology

## 2013-04-20 ENCOUNTER — Telehealth (HOSPITAL_COMMUNITY): Payer: Self-pay

## 2013-04-20 DIAGNOSIS — E876 Hypokalemia: Secondary | ICD-10-CM

## 2013-04-20 LAB — PSA: PSA: 0.27 ng/mL (ref <=4.00)

## 2013-04-20 MED ORDER — POTASSIUM CHLORIDE ER 10 MEQ PO TBCR: 10.0000 meq | EXTENDED_RELEASE_TABLET | Freq: Every day | ORAL | Status: DC

## 2013-04-20 NOTE — Telephone Encounter (Signed)
Patient's wife called and lab results discussed and orders for medication given and patient's wife verbalized understanding.

## 2013-05-08 ENCOUNTER — Other Ambulatory Visit (HOSPITAL_COMMUNITY): Payer: Self-pay | Admitting: Oncology

## 2013-05-08 DIAGNOSIS — C61 Malignant neoplasm of prostate: Secondary | ICD-10-CM

## 2013-05-08 MED ORDER — ABIRATERONE ACETATE 250 MG PO TABS: 1000.0000 mg | ORAL_TABLET | Freq: Every day | ORAL | Status: DC

## 2013-05-08 MED ORDER — PREDNISONE 5 MG PO TABS: 5.0000 mg | ORAL_TABLET | Freq: Two times a day (BID) | ORAL | Status: DC

## 2013-05-10 ENCOUNTER — Emergency Department (HOSPITAL_COMMUNITY): Payer: Medicare Other

## 2013-05-10 ENCOUNTER — Encounter (HOSPITAL_COMMUNITY): Payer: Self-pay

## 2013-05-10 ENCOUNTER — Observation Stay (HOSPITAL_COMMUNITY)
Admission: EM | Admit: 2013-05-10 | Discharge: 2013-05-12 | DRG: 292 | Disposition: A | Discharge status: Home / Self Care | Payer: Medicare Other | Attending: Family Medicine | Admitting: Family Medicine

## 2013-05-10 DIAGNOSIS — J4489 Other specified chronic obstructive pulmonary disease: Secondary | ICD-10-CM | POA: Diagnosis present

## 2013-05-10 DIAGNOSIS — Z794 Long term (current) use of insulin: Secondary | ICD-10-CM

## 2013-05-10 DIAGNOSIS — R319 Hematuria, unspecified: Secondary | ICD-10-CM

## 2013-05-10 DIAGNOSIS — R0600 Dyspnea, unspecified: Secondary | ICD-10-CM

## 2013-05-10 DIAGNOSIS — C61 Malignant neoplasm of prostate: Secondary | ICD-10-CM | POA: Diagnosis present

## 2013-05-10 DIAGNOSIS — IMO0001 Reserved for inherently not codable concepts without codable children: Secondary | ICD-10-CM | POA: Diagnosis present

## 2013-05-10 DIAGNOSIS — E7800 Pure hypercholesterolemia, unspecified: Secondary | ICD-10-CM | POA: Diagnosis present

## 2013-05-10 DIAGNOSIS — R0609 Other forms of dyspnea: Secondary | ICD-10-CM

## 2013-05-10 DIAGNOSIS — E118 Type 2 diabetes mellitus with unspecified complications: Secondary | ICD-10-CM | POA: Diagnosis present

## 2013-05-10 DIAGNOSIS — M1990 Unspecified osteoarthritis, unspecified site: Secondary | ICD-10-CM | POA: Diagnosis present

## 2013-05-10 DIAGNOSIS — I5023 Acute on chronic systolic (congestive) heart failure: Principal | ICD-10-CM | POA: Diagnosis present

## 2013-05-10 DIAGNOSIS — E119 Type 2 diabetes mellitus without complications: Secondary | ICD-10-CM

## 2013-05-10 DIAGNOSIS — Z9119 Patient's noncompliance with other medical treatment and regimen: Secondary | ICD-10-CM

## 2013-05-10 DIAGNOSIS — G609 Hereditary and idiopathic neuropathy, unspecified: Secondary | ICD-10-CM | POA: Diagnosis present

## 2013-05-10 DIAGNOSIS — I509 Heart failure, unspecified: Secondary | ICD-10-CM | POA: Diagnosis present

## 2013-05-10 DIAGNOSIS — E785 Hyperlipidemia, unspecified: Secondary | ICD-10-CM | POA: Diagnosis present

## 2013-05-10 DIAGNOSIS — J449 Chronic obstructive pulmonary disease, unspecified: Secondary | ICD-10-CM | POA: Diagnosis present

## 2013-05-10 DIAGNOSIS — R0902 Hypoxemia: Secondary | ICD-10-CM | POA: Diagnosis present

## 2013-05-10 DIAGNOSIS — I10 Essential (primary) hypertension: Secondary | ICD-10-CM | POA: Diagnosis present

## 2013-05-10 DIAGNOSIS — F411 Generalized anxiety disorder: Secondary | ICD-10-CM | POA: Diagnosis present

## 2013-05-10 DIAGNOSIS — Z91199 Patient's noncompliance with other medical treatment and regimen due to unspecified reason: Secondary | ICD-10-CM

## 2013-05-10 DIAGNOSIS — I359 Nonrheumatic aortic valve disorder, unspecified: Secondary | ICD-10-CM

## 2013-05-10 DIAGNOSIS — I428 Other cardiomyopathies: Secondary | ICD-10-CM | POA: Diagnosis present

## 2013-05-10 LAB — CBC WITH DIFFERENTIAL/PLATELET
Basophils Absolute: 0 10*3/uL (ref 0.0–0.1)
Eosinophils Relative: 1 % (ref 0–5)
HCT: 33.1 % — ABNORMAL LOW (ref 39.0–52.0)
Hemoglobin: 10.6 g/dL — ABNORMAL LOW (ref 13.0–17.0)
Lymphocytes Relative: 14 % (ref 12–46)
Lymphs Abs: 0.9 10*3/uL (ref 0.7–4.0)
MCV: 84.4 fL (ref 78.0–100.0)
Monocytes Absolute: 0.4 10*3/uL (ref 0.1–1.0)
Neutro Abs: 4.8 10*3/uL (ref 1.7–7.7)
RBC: 3.92 MIL/uL — ABNORMAL LOW (ref 4.22–5.81)
RDW: 18.3 % — ABNORMAL HIGH (ref 11.5–15.5)
WBC: 6.2 10*3/uL (ref 4.0–10.5)

## 2013-05-10 LAB — URINALYSIS, ROUTINE W REFLEX MICROSCOPIC: Glucose, UA: NEGATIVE mg/dL

## 2013-05-10 LAB — PROTIME-INR: Prothrombin Time: 13.2 seconds (ref 11.6–15.2)

## 2013-05-10 LAB — URINE MICROSCOPIC-ADD ON

## 2013-05-10 LAB — GLUCOSE, CAPILLARY: Glucose-Capillary: 115 mg/dL — ABNORMAL HIGH (ref 70–99)

## 2013-05-10 LAB — BASIC METABOLIC PANEL
BUN: 19 mg/dL (ref 6–23)
Calcium: 9.1 mg/dL (ref 8.4–10.5)
GFR calc non Af Amer: 81 mL/min — ABNORMAL LOW (ref >90)
Glucose, Bld: 152 mg/dL — ABNORMAL HIGH (ref 70–99)

## 2013-05-10 MED ORDER — CARVEDILOL 12.5 MG PO TABS
25.0000 mg | ORAL_TABLET | Freq: Two times a day (BID) | ORAL | Status: DC
  Administered 2013-05-11 – 2013-05-12 (×3): 25 mg via ORAL
  Filled 2013-05-10: qty 1
  Filled 2013-05-10 (×3): qty 2

## 2013-05-10 MED ORDER — LISINOPRIL 10 MG PO TABS
40.0000 mg | ORAL_TABLET | Freq: Every day | ORAL | Status: DC
  Administered 2013-05-11 – 2013-05-12 (×2): 40 mg via ORAL
  Filled 2013-05-10 (×2): qty 4

## 2013-05-10 MED ORDER — ISOSORBIDE DINITRATE 20 MG PO TABS
20.0000 mg | ORAL_TABLET | Freq: Three times a day (TID) | ORAL | Status: DC
  Administered 2013-05-10 – 2013-05-12 (×6): 20 mg via ORAL
  Filled 2013-05-10 (×6): qty 1

## 2013-05-10 MED ORDER — SODIUM CHLORIDE 0.9 % IJ SOLN: 3.0000 mL | INTRAMUSCULAR | Status: DC | PRN

## 2013-05-10 MED ORDER — LORAZEPAM 0.5 MG PO TABS
0.5000 mg | ORAL_TABLET | Freq: Once | ORAL | Status: AC
  Administered 2013-05-10: 0.5 mg via ORAL
  Filled 2013-05-10: qty 1

## 2013-05-10 MED ORDER — HEPARIN SODIUM (PORCINE) 5000 UNIT/ML IJ SOLN: 5000.0000 [IU] | Freq: Three times a day (TID) | INTRAMUSCULAR | Status: DC

## 2013-05-10 MED ORDER — PREDNISONE 10 MG PO TABS
5.0000 mg | ORAL_TABLET | Freq: Two times a day (BID) | ORAL | Status: DC
  Administered 2013-05-10 – 2013-05-12 (×4): 5 mg via ORAL
  Filled 2013-05-10 (×4): qty 1

## 2013-05-10 MED ORDER — ACETAMINOPHEN 325 MG PO TABS: 650.0000 mg | ORAL_TABLET | ORAL | Status: DC | PRN

## 2013-05-10 MED ORDER — INSULIN GLARGINE 100 UNIT/ML ~~LOC~~ SOLN
20.0000 [IU] | Freq: Every day | SUBCUTANEOUS | Status: DC
  Administered 2013-05-11 – 2013-05-12 (×2): 20 [IU] via SUBCUTANEOUS
  Filled 2013-05-10 (×4): qty 0.2

## 2013-05-10 MED ORDER — AMLODIPINE BESYLATE 5 MG PO TABS
10.0000 mg | ORAL_TABLET | Freq: Every day | ORAL | Status: DC
  Administered 2013-05-11 – 2013-05-12 (×2): 10 mg via ORAL
  Filled 2013-05-10 (×2): qty 2

## 2013-05-10 MED ORDER — HYDRALAZINE HCL 25 MG PO TABS
50.0000 mg | ORAL_TABLET | Freq: Three times a day (TID) | ORAL | Status: DC
  Administered 2013-05-10 – 2013-05-12 (×6): 50 mg via ORAL
  Filled 2013-05-10 (×6): qty 2

## 2013-05-10 MED ORDER — SODIUM CHLORIDE 0.9 % IV SOLN: 250.0000 mL | INTRAVENOUS | Status: DC | PRN

## 2013-05-10 MED ORDER — INSULIN ASPART 100 UNIT/ML ~~LOC~~ SOLN
0.0000 [IU] | Freq: Three times a day (TID) | SUBCUTANEOUS | Status: DC
  Administered 2013-05-11: 1 [IU] via SUBCUTANEOUS
  Administered 2013-05-11: 2 [IU] via SUBCUTANEOUS

## 2013-05-10 MED ORDER — FUROSEMIDE 10 MG/ML IJ SOLN
20.0000 mg | Freq: Once | INTRAMUSCULAR | Status: AC
  Administered 2013-05-10: 20 mg via INTRAVENOUS
  Filled 2013-05-10: qty 2

## 2013-05-10 MED ORDER — QUINAPRIL HCL 10 MG PO TABS
40.0000 mg | ORAL_TABLET | Freq: Every day | ORAL | Status: DC
  Filled 2013-05-10: qty 4

## 2013-05-10 MED ORDER — ONDANSETRON HCL 4 MG/2ML IJ SOLN: 4.0000 mg | Freq: Four times a day (QID) | INTRAMUSCULAR | Status: DC | PRN

## 2013-05-10 MED ORDER — IOHEXOL 350 MG/ML SOLN
100.0000 mL | Freq: Once | INTRAVENOUS | Status: AC | PRN
  Administered 2013-05-10: 100 mL via INTRAVENOUS

## 2013-05-10 MED ORDER — SODIUM CHLORIDE 0.9 % IJ SOLN
3.0000 mL | Freq: Two times a day (BID) | INTRAMUSCULAR | Status: DC
  Administered 2013-05-10 – 2013-05-12 (×5): 3 mL via INTRAVENOUS

## 2013-05-10 MED ORDER — ASPIRIN EC 81 MG PO TBEC
81.0000 mg | DELAYED_RELEASE_TABLET | Freq: Every day | ORAL | Status: DC
  Administered 2013-05-11 – 2013-05-12 (×2): 81 mg via ORAL
  Filled 2013-05-10 (×2): qty 1

## 2013-05-10 NOTE — Progress Notes (Signed)
Paged Dr. Elvera Lennox to let him know pt is in room 337.

## 2013-05-10 NOTE — ED Notes (Signed)
2D echo being performed at this time.

## 2013-05-10 NOTE — ED Provider Notes (Signed)
History    CSN: [id number] Arrival date & time 05/10/13  1221  First MD Initiated Contact with Patient 05/10/13 1233     Chief Complaint  Patient presents with  . Shortness of Breath   (Consider location/radiation/quality/duration/timing/severity/associated sxs/prior Treatment) Patient is a 72 y.o. male presenting with shortness of breath.  Shortness of Breath  Pt wit history of recurrent prostate cancer reports SOB today while walking to the mailbox. Has had some SOB off and on for a few days, but denies any CP. He reports occasional productive cough but no fever. He reports diffuse headache ongoing for several weeks. He has had ureteral obstruction and stent from prostate CA, but draining well. Reports small amount of blood in urine yesterday but no abdominal pain. No N/V/D.  Past Medical History  Diagnosis Date  . Skin infection     history  . HTN (hypertension) 11/23/2011  . High cholesterol   . Diabetes mellitus   . Chronic headache     daily  . Anxiety   . Osteoarthritis   . COPD (chronic obstructive pulmonary disease)     patient denies  . Peripheral neuropathy   . 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 CHF (congestive heart failure)   . Medical non-compliance   . Heart disease    Past Surgical History  Procedure Laterality Date  . Transurethral resection of prostate  03/2008  . Cardiac catheterization  02/2009    minimal non-obstructive CAD  . Cystoscopy w/ ureteral stent placement Right 04/06/2013    Procedure: CYSTOSCOPY WITH RETROGRADE PYELOGRAM/URETERAL STENT PLACEMENT;  Surgeon: Ky Barban, MD;  Location: [facility];  Service: Urology;  Laterality: Right;  . Cystoscopy w/ ureteral stent removal Right 04/06/2013    Procedure: CYSTOSCOPY WITH STENT REMOVAL;  Surgeon: Ky Barban, MD;  Location: [facility];  Service: Urology;  Laterality: Right;   Family History  Problem Relation Age of Onset  . Cancer Mother   . Cancer Father     History  Substance Use Topics  . Smoking status: Never Smoker   . Smokeless tobacco: Never Used  . Alcohol Use: No    Review of Systems  Respiratory: Positive for shortness of breath.    All other systems reviewed and are negative except as noted in HPI.   Allergies  Review of patient's allergies indicates no known allergies.  Home Medications   Current Outpatient Rx  Name  Route  Sig  Dispense  Refill  . abiraterone Acetate (ZYTIGA) 250 MG tablet   Oral   Take 4 tablets (1,000 mg total) by mouth daily. Take on an empty stomach 1 hour before or 2 hours after a meal   120 tablet   0   . amLODipine (NORVASC) 10 MG tablet   Oral   Take 10 mg by mouth daily.         Marland Kitchen aspirin EC 81 MG tablet   Oral   Take 81 mg by mouth daily.         . carbamide peroxide (DEBROX) 6.5 % otic solution   Both Ears   Place 5 drops into both ears 2 (two) times daily.   15 mL   0   . carvedilol (COREG) 25 MG tablet   Oral   Take 25 mg by mouth 2 (two) times daily with a meal.         . hydrALAZINE (APRESOLINE) 50 MG tablet   Oral  Take 50 mg by mouth 3 (three) times daily.         . insulin glargine (LANTUS) 100 UNIT/ML injection   Subcutaneous   Inject 24 Units into the skin daily.          . iron polysaccharides (NIFEREX) 150 MG capsule   Oral   Take 1 capsule (150 mg total) by mouth daily.   30 capsule   4   . isosorbide dinitrate (ISORDIL) 20 MG tablet   Oral   Take 20 mg by mouth 3 (three) times daily.          . metFORMIN (GLUCOPHAGE) 500 MG tablet   Oral   Take 500 mg by mouth 2 (two) times daily with a meal.           . potassium chloride (K-DUR) 10 MEQ tablet   Oral   Take 1 tablet (10 mEq total) by mouth daily.   30 tablet   0     Please deliver   . predniSONE (DELTASONE) 5 MG tablet   Oral   Take 1 tablet (5 mg total) by mouth 2 (two) times daily.   60 tablet   0     Deliver   . quinapril (ACCUPRIL) 40 MG tablet   Oral   Take 40  mg by mouth daily.            BP 144/85  Pulse 88  Temp(Src) 97.5 F (36.4 C) (Oral)  Resp 22  Ht 5\' 6"  (1.676 m)  Wt 204 lb (92.534 kg)  BMI 32.94 kg/m2  SpO2 99% Physical Exam  Nursing note and vitals reviewed. Constitutional: He is oriented to person, place, and time. He appears well-developed and well-nourished.  HENT:  Head: Normocephalic and atraumatic.  Eyes: EOM are normal. Pupils are equal, round, and reactive to light.  Neck: Normal range of motion. Neck supple.  Cardiovascular: Normal rate, normal heart sounds and intact distal pulses.   Pulmonary/Chest: Effort normal and breath sounds normal.  Abdominal: Bowel sounds are normal. He exhibits no distension. There is no tenderness.  Musculoskeletal: Normal range of motion. He exhibits no edema and no tenderness.  Neurological: He is alert and oriented to person, place, and time. He has normal strength. No cranial nerve deficit or sensory deficit.  Skin: Skin is warm and dry. No rash noted.  Psychiatric: He has a normal mood and affect.    ED Course  Procedures (including critical care time) Labs Reviewed  CBC WITH DIFFERENTIAL - Abnormal; Notable for the following:    RBC 3.92 (*)    Hemoglobin 10.6 (*)    HCT 33.1 (*)    RDW 18.3 (*)    Neutrophils Relative % 78 (*)    All other components within normal limits  BASIC METABOLIC PANEL - Abnormal; Notable for the following:    Glucose, Bld 152 (*)    GFR calc non Af Amer 81 (*)    All other components within normal limits  PRO B NATRIURETIC PEPTIDE - Abnormal; Notable for the following:    Pro B Natriuretic peptide (BNP) 1740.0 (*)    All other components within normal limits  URINALYSIS, ROUTINE W REFLEX MICROSCOPIC - Abnormal; Notable for the following:    Hgb urine dipstick LARGE (*)    Protein, ur TRACE (*)    Leukocytes, UA SMALL (*)    All other components within normal limits  TROPONIN I  PROTIME-INR  APTT  URINE MICROSCOPIC-ADD ON   Ct  Angio  Chest Pe W/cm &/or Wo Cm  05/10/2013   *RADIOLOGY REPORT*  Clinical Data: Shortness of breath.  Prostate cancer.  CT ANGIOGRAPHY CHEST  Technique:  Multidetector CT imaging of the chest using the standard protocol during bolus administration of intravenous contrast. Multiplanar reconstructed images including MIPs were obtained and reviewed to evaluate the vascular anatomy.  Contrast: OMNIPAQUE IOHEXOL 350 MG/ML SOLN  Comparison: Chest x-ray dated 04/06/2013 and a chest CT dated 04/26/2002  Findings: There are no pulmonary emboli, infiltrates, or effusions. No significant hilar or mediastinal adenopathy.  The patient has developed marked cardiomegaly since the prior CT scan of 2003.  There is chronic prominence of the main pulmonary arteries but this has increased substantially since the prior study.  No significant osseous abnormality.  IMPRESSION:  1.  No pulmonary emboli or other acute abnormalities. 2.  Marked cardiomegaly, new since 2003, with increased chronic prominence of the pulmonary arteries.   Original Report Authenticated By: Francene Boyers, M.D.   1. Dyspnea   2. Hematuria     MDM   Date: 05/10/2013  Rate: 88  Rhythm: normal sinus rhythm  QRS Axis: left  Intervals: QT prolonged  ST/T Wave abnormalities: nonspecific T wave changes  Conduction Disutrbances:none  Narrative Interpretation:   Old EKG Reviewed: unchanged  Pt with hypoxia to 88% while walking with tachypnea. He has known nonischemic cardiomyopathy, last echo was over a year ago. Will admit for dyspnea. Discussed with Hospitalist. Lasix ordered.    Jaycen Vercher B. Bernette Mayers, MD 05/10/13 1524

## 2013-05-10 NOTE — ED Notes (Signed)
States that he started getting short of breath today.  States that he walked to his mailbox and back to his house and was out of breath.

## 2013-05-10 NOTE — H&P (Signed)
[facility] History and Physical  Oscar Hickman ZOX:[id number] DOB: 1941-01-01 DOA: 05/10/2013  Referring physician: Dr. Bernette Mayers PCP: [name], MD  Specialists: none  Chief Complaint: shortness of breath  HPI: Oscar Hickman is a 72 y.o. male has a past medical history significant for NICM, systolic heart failure with most recent EF of 20-25% in April 2013, insulin dependent DM, HTN, HLD, prostate cancer, R hydronephrosis s/p stent placement presents with shortness of breath with minimal exertion that started this morning while walking to his mailbox. He also has a history of non compliance when it comes to his dietary habits and is not monitoring his salt intake and often eats fast foods.   He denies chest pain, has no lightheadedness or dizziness; denies cough/sputum production, fever or chills. He has no dysuria or burning with urination and is urinating well. Denies abdominal pain, nausea/vomiting or diarrhea. He is not sure whether he had any weight gain recently. Denies swelling in his legs however endorses some swelling in his abdomen.  In the ED patient has elevated BNP. He underwent a CTPA which was negative for PE.   Review of Systems: as per HPI otherwise negative.   Past Medical History  Diagnosis Date  . Skin infection     history  . HTN (hypertension) 11/23/2011  . High cholesterol   . Diabetes mellitus   . Chronic headache     daily  . Anxiety   . Osteoarthritis   . COPD (chronic obstructive pulmonary disease)     patient denies  . Peripheral neuropathy   . 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 CHF (congestive heart failure)   . Medical non-compliance   . Heart disease    Past Surgical History  Procedure Laterality Date  . Transurethral resection of prostate  03/2008  . Cardiac catheterization  02/2009    minimal non-obstructive CAD  . Cystoscopy w/ ureteral stent placement Right 04/06/2013    Procedure:  CYSTOSCOPY WITH RETROGRADE PYELOGRAM/URETERAL STENT PLACEMENT;  Surgeon: Ky Barban, MD;  Location: [facility];  Service: Urology;  Laterality: Right;  . Cystoscopy w/ ureteral stent removal Right 04/06/2013    Procedure: CYSTOSCOPY WITH STENT REMOVAL;  Surgeon: Ky Barban, MD;  Location: [facility];  Service: Urology;  Laterality: Right;   Social History:  reports that he has never smoked. He has never used smokeless tobacco. He reports that he does not drink alcohol or use illicit drugs.  No Known Allergies  Family History  Problem Relation Age of Onset  . Cancer Mother   . Cancer Father    Prior to Admission medications   Medication Sig Start Date End Date Taking? Authorizing Provider  abiraterone Acetate (ZYTIGA) 250 MG tablet Take 4 tablets (1,000 mg total) by mouth daily. Take on an empty stomach 1 hour before or 2 hours after a meal 05/08/13  Yes Maurine Minister [name], PA-C  amLODipine (NORVASC) 10 MG tablet Take 10 mg by mouth daily.   Yes Historical Provider, MD  aspirin EC 81 MG tablet Take 81 mg by mouth daily.   Yes Historical Provider, MD  carvedilol (COREG) 25 MG tablet Take 25 mg by mouth 2 (two) times daily with a meal.   Yes Historical Provider, MD  hydrALAZINE (APRESOLINE) 50 MG tablet Take 50 mg by mouth 3 (three) times daily.   Yes Historical Provider, MD  insulin glargine (LANTUS) 100 UNIT/ML injection Inject 24 Units into the skin daily.  Yes Historical Provider, MD  iron polysaccharides (NIFEREX) 150 MG capsule Take 1 capsule (150 mg total) by mouth daily. 04/09/13  Yes [name], MD  isosorbide dinitrate (ISORDIL) 20 MG tablet Take 20 mg by mouth 3 (three) times daily.  06/18/11  Yes Historical Provider, MD  metFORMIN (GLUCOPHAGE) 500 MG tablet Take 500 mg by mouth 2 (two) times daily with a meal.     Yes Historical Provider, MD  potassium chloride (K-DUR) 10 MEQ tablet Take 1 tablet (10 mEq total) by mouth daily. 04/20/13  Yes Maurine Minister [name], PA-C  predniSONE  (DELTASONE) 5 MG tablet Take 1 tablet (5 mg total) by mouth 2 (two) times daily. 05/08/13  Yes Maurine Minister [name], PA-C  quinapril (ACCUPRIL) 40 MG tablet Take 40 mg by mouth daily.     Yes Historical Provider, MD   Physical Exam: Filed Vitals:   05/10/13 1330 05/10/13 1345 05/10/13 1400 05/10/13 1500  BP:   161/88 174/94  Pulse: 85 80 81 81  Temp:      TempSrc:      Resp: 26 25 27 26   Height:      Weight:      SpO2: 96% 100% 97% 96%     General:  No apparent distress, mildly tachypneic  Eyes: PERRL, EOMI, no scleral icterus, arcus senilis  ENT: moist oropharynx  Neck: supple, + JVD up to earlobe  Cardiovascular: regular rate without MRG; 2+ peripheral pulses  Respiratory:  good air movement without wheezing, rhonchi; faint crackles at the bases  Abdomen: soft, non tender to palpation, positive bowel sounds, no guarding, no rebound.   Skin: no rashes  Musculoskeletal: trace pitting peripheral edema  Psychiatric: normal mood and affect  Neurologic: CN 2-12 grossly intact, MS 5/5 in all 4  Labs on Admission:  Basic Metabolic Panel:  Recent Labs Lab 05/10/13 1309  NA 139  K 4.1  CL 105  CO2 27  GLUCOSE 152*  BUN 19  CREATININE 0.98  CALCIUM 9.1   CBC:  Recent Labs Lab 05/10/13 1309  WBC 6.2  NEUTROABS 4.8  HGB 10.6*  HCT 33.1*  MCV 84.4  PLT 304   Cardiac Enzymes:  Recent Labs Lab 05/10/13 1309  TROPONINI <0.30    BNP (last 3 results)  Recent Labs  05/10/13 1309  PROBNP 1740.0*   Radiological Exams on Admission: Ct Angio Chest Pe W/cm &/or Wo Cm  05/10/2013   *RADIOLOGY REPORT*  Clinical Data: Shortness of breath.  Prostate cancer.  CT ANGIOGRAPHY CHEST  Technique:  Multidetector CT imaging of the chest using the standard protocol during bolus administration of intravenous contrast. Multiplanar reconstructed images including MIPs were obtained and reviewed to evaluate the vascular anatomy.  Contrast: OMNIPAQUE IOHEXOL 350 MG/ML SOLN   Comparison: Chest x-ray dated 04/06/2013 and a chest CT dated 04/26/2002  Findings: There are no pulmonary emboli, infiltrates, or effusions. No significant hilar or mediastinal adenopathy.  The patient has developed marked cardiomegaly since the prior CT scan of 2003.  There is chronic prominence of the main pulmonary arteries but this has increased substantially since the prior study.  No significant osseous abnormality.  IMPRESSION:  1.  No pulmonary emboli or other acute abnormalities. 2.  Marked cardiomegaly, new since 2003, with increased chronic prominence of the pulmonary arteries.   Original Report Authenticated By: Francene Boyers, M.D.    EKG: Independently reviewed.  Assessment/Plan  Active Problems:   DIABETES MELLITUS, TYPE II   HYPERLIPIDEMIA   HTN (  hypertension)   Cardiomyopathy, nonischemic   Acute on chronic systolic CHF (congestive heart failure)    Acute on chronic heart failure - last EF severely decreased. Patient also non compliant with his dietary salt intake - has JVD and some evidence of fluid overload - 20 IV lasix in the ED, monitor strict I&Os, consider repeating in am; recheck BMP in morning - daily weights.  - telemetry  IDDM - Lantus 20 U in am, already received his dose this morning. At home 24-26 U daily - SSI - last HBA1C 5.9 ~10 weeks ago  HTN - restart his home medications  NICM - restart home medcations  Poorly differentiated prostate carcinoma - followed up as an outpatient  History of R ureteral stent s/p revision per urology 1 months ago - urinating well, will monitor. No fever/chills signs of active infection.  DVT Prophylaxis - SCDs, holding heparin since patient with hematuria  Code Status: Full  Family Communication: wife in the room  Disposition Plan: Inpatient  Time spent: 36  Oscar Zelman M. Elvera Lennox, MD [facility] Pager [phone]  If 7PM-7AM, please contact night-coverage [email or web address] Password [facility] 05/10/2013, 3:46  PM

## 2013-05-10 NOTE — Progress Notes (Signed)
*  PRELIMINARY RESULTS* Echocardiogram 2D Echocardiogram has been performed.  [place] 05/10/2013, 4:31 PM

## 2013-05-11 LAB — GLUCOSE, CAPILLARY
Glucose-Capillary: 182 mg/dL — ABNORMAL HIGH (ref 70–99)
Glucose-Capillary: 134 mg/dL — ABNORMAL HIGH (ref 70–99)
Glucose-Capillary: 128 mg/dL — ABNORMAL HIGH (ref 70–99)

## 2013-05-11 LAB — CBC
HCT: 35.9 % — ABNORMAL LOW (ref 39.0–52.0)
Hemoglobin: 11.4 g/dL — ABNORMAL LOW (ref 13.0–17.0)
MCH: 26.8 pg (ref 26.0–34.0)
RBC: 4.25 MIL/uL (ref 4.22–5.81)

## 2013-05-11 LAB — BASIC METABOLIC PANEL
BUN: 14 mg/dL (ref 6–23)
Chloride: 103 mEq/L (ref 96–112)
GFR calc non Af Amer: >90 mL/min (ref >90)
Glucose, Bld: 109 mg/dL — ABNORMAL HIGH (ref 70–99)
Potassium: 3.7 mEq/L (ref 3.5–5.1)
Sodium: 141 mEq/L (ref 135–145)

## 2013-05-11 LAB — MAGNESIUM: Magnesium: 2 mg/dL (ref 1.5–2.5)

## 2013-05-11 LAB — PHOSPHORUS: Phosphorus: 3.9 mg/dL (ref 2.3–4.6)

## 2013-05-11 NOTE — Progress Notes (Signed)
[id number] 

## 2013-05-11 NOTE — Progress Notes (Signed)
[name], [name] NO.:  [id number]  MEDICAL RECORD NO.:  [id number]  LOCATION:  A337                          FACILITY:  [facility]  PHYSICIAN:  Melvyn Novas, MDDATE OF BIRTH:  1941/06/12  DATE OF PROCEDURE: DATE OF DISCHARGE:                                PROGRESS NOTE   SUBJECTIVE:  The patient admitted with some nondescript symptoms last night.  The patient has cognitive dysfunction chronically insulin- dependent diabetes, idiopathic cardiomyopathy, chronic congestive systolic and diastolic heart failure, hypertension, chronic noncompliance, basically came to the ER for some epigastric discomfort and some dizziness and some "not feeling well".  The patient appears hemodynamically stable today in hospital.  He states, he has been taking all of his medicines.  OBJECTIVE:  Blood pressure is 133/76, temperature 98.2 pulse 87 regular, respiratory rate is 20, O2 sat is 99%.  Lungs are clear.  No rales, wheeze, or rhonchi. Heart:  Regular rhythm.  No S3 audible.  No heaves, thrills, or rubs. Extremities:  Trace to 1+ pedal edema.  PLAN:  Right now is to get BMET in morning if patient's final discharge at earliest convenience.  His GI symptoms appeared to have subsided.     Melvyn Novas, MD     RMD/MEDQ  D:  05/11/2013  T:  05/11/2013  Job:  [id number]

## 2013-05-11 NOTE — Care Management Note (Unsigned)
    Page 1 of 1   05/11/2013     5:41:14 PM   CARE MANAGEMENT NOTE 05/11/2013  Patient:  Oscar Hickman, Oscar Hickman   Account Number:  [id number]  Date Initiated:  05/11/2013  Documentation initiated by:  Anibal Henderson  Subjective/Objective Assessment:   patient admitted with CHF. Patient lives at home, is fairly independent and will be returning home at discharge     Action/Plan:   patient would benefit from home health to follow for disease management. [facility] and would like for them to come again   Anticipated DC Date:  05/11/2013   Anticipated DC Plan:  HOME W HOME HEALTH SERVICES      DC Planning Services  CM consult      [facility] Choice  HOME HEALTH   Choice offered to / List presented to:          [facility] arranged  HH-1 RN  HH-10 DISEASE MANAGEMENT      HH agency  [facility]   Status of service:  In process, will continue to follow Medicare Important Message given?   (If response is "NO", the following Medicare IM given date fields will be blank) Date Medicare IM given:   Date Additional Medicare IM given:    Discharge Disposition:    Per UR Regulation:    If discussed at Long Length of Stay Meetings, dates discussed:    Comments:  05/11/13 1730 , Anibal Henderson RN/CM

## 2013-05-12 LAB — BASIC METABOLIC PANEL
Chloride: 101 mEq/L (ref 96–112)
GFR calc Af Amer: >90 mL/min (ref >90)
GFR calc non Af Amer: 87 mL/min — ABNORMAL LOW (ref >90)
Potassium: 4 mEq/L (ref 3.5–5.1)
Sodium: 138 mEq/L (ref 135–145)

## 2013-05-12 NOTE — Progress Notes (Signed)
Pt discharged with instructions and care notes.  The patient and his wife verbalized understanding.  The patient left the floor via w/c in stable condition.

## 2013-05-12 NOTE — Discharge Summary (Signed)
[id number] 

## 2013-05-13 NOTE — Discharge Summary (Signed)
NAME:  Oscar Hickman, Oscar Hickman NO.:  [id number]  MEDICAL RECORD NO.:  [id number]  LOCATION:  A337                          FACILITY:  [facility]  PHYSICIAN:  Melvyn Novas, MDDATE OF BIRTH:  15-Oct-1941  DATE OF ADMISSION:  05/10/2013 DATE OF DISCHARGE:  07/05/2014LH                              DISCHARGE SUMMARY   HOSPITAL COURSE:  The patient is 72 year old black male, history of hypertension, nonischemic cardiomyopathy, ejection fraction 20% to 25%, hypertension, hyperlipidemia, insulin dependent diabetes along with chronic noncompliance.  The patient came to the hospital for myriad of symptoms including ear discomfort, dyspnea, some epigastric discomfort, some increasing dyspnea, and not feeling well altogether.  He was subsequently admitted to treat his nausea, dyspepsia.  His sugar was uncontrolled in the ER, was subsequently with good control while in the hospital on split night regimen.  He was hemodynamically stable. Continued on all of his home blood pressure medicines and the patient symptoms resolved over 36-hour period, was subsequently discharged.  His enzymes were negative.  Chest x-ray revealed no evidence of significant volume overload over baseline.  He had no symptoms referable to angina, ischemia, dyspnea, or orthopnea.  It was felt safe for him to return home, and he was continued on all of his previous medicines which include, Zytiga 250 mg 4 tablets daily, Norvasc 10 mg p.o. daily, aspirin 81 mg p.o. daily, carvedilol 25 mg p.o. b.i.d., hydralazine 50 mg p.o. q.8 hours, Lantus insulin 24 units at bedtime, Isordil 20 mg p.o. daily in a.m., Glucophage 500 mg p.o. b.i.d.,  potassium chloride 10 mEq p.o. daily, and Accupril 40 mg p.o. daily.  He is instructed to stop taking Zofran, stop taking prednisone, and to follow up in my office in 3 days time to assess his symptomatology.     Melvyn Novas, MD     RMD/MEDQ  D:  05/12/2013   T:  05/13/2013  Job:  [id number]

## 2013-05-14 ENCOUNTER — Other Ambulatory Visit (HOSPITAL_COMMUNITY): Payer: Self-pay | Admitting: Oncology

## 2013-05-14 DIAGNOSIS — C61 Malignant neoplasm of prostate: Secondary | ICD-10-CM

## 2013-05-14 MED ORDER — ABIRATERONE ACETATE 250 MG PO TABS: 1000.0000 mg | ORAL_TABLET | Freq: Every day | ORAL | Status: DC

## 2013-05-14 NOTE — Progress Notes (Signed)
UR chart review completed.  

## 2013-06-15 ENCOUNTER — Other Ambulatory Visit (HOSPITAL_COMMUNITY): Payer: Self-pay | Admitting: Oncology

## 2013-06-15 DIAGNOSIS — C61 Malignant neoplasm of prostate: Secondary | ICD-10-CM

## 2013-06-18 ENCOUNTER — Other Ambulatory Visit (HOSPITAL_COMMUNITY): Payer: Self-pay | Admitting: Oncology

## 2013-06-18 DIAGNOSIS — C61 Malignant neoplasm of prostate: Secondary | ICD-10-CM

## 2013-06-18 MED ORDER — PREDNISONE 5 MG PO TABS: 5.0000 mg | ORAL_TABLET | Freq: Two times a day (BID) | ORAL | Status: AC

## 2013-07-13 ENCOUNTER — Other Ambulatory Visit (HOSPITAL_COMMUNITY): Payer: Self-pay | Admitting: Oncology

## 2013-07-13 DIAGNOSIS — C61 Malignant neoplasm of prostate: Secondary | ICD-10-CM

## 2013-07-13 MED ORDER — ABIRATERONE ACETATE 250 MG PO TABS: 1000.0000 mg | ORAL_TABLET | Freq: Every day | ORAL | Status: DC

## 2013-07-19 ENCOUNTER — Encounter (HOSPITAL_COMMUNITY): Payer: Self-pay | Admitting: Oncology

## 2013-07-19 ENCOUNTER — Encounter (HOSPITAL_BASED_OUTPATIENT_CLINIC_OR_DEPARTMENT_OTHER): Payer: Medicare Other | Admitting: Oncology

## 2013-07-19 ENCOUNTER — Encounter (HOSPITAL_COMMUNITY): Payer: Medicare Other | Attending: Hematology and Oncology

## 2013-07-19 ENCOUNTER — Encounter (HOSPITAL_BASED_OUTPATIENT_CLINIC_OR_DEPARTMENT_OTHER): Payer: Medicare Other

## 2013-07-19 VITALS — BP 150/84 | HR 83 | Temp 97.9°F | Resp 20 | Wt 202.0 lb

## 2013-07-19 DIAGNOSIS — H6123 Impacted cerumen, bilateral: Secondary | ICD-10-CM

## 2013-07-19 DIAGNOSIS — E119 Type 2 diabetes mellitus without complications: Secondary | ICD-10-CM

## 2013-07-19 DIAGNOSIS — C61 Malignant neoplasm of prostate: Secondary | ICD-10-CM

## 2013-07-19 DIAGNOSIS — F0390 Unspecified dementia without behavioral disturbance: Secondary | ICD-10-CM

## 2013-07-19 DIAGNOSIS — I10 Essential (primary) hypertension: Secondary | ICD-10-CM

## 2013-07-19 DIAGNOSIS — H6120 Impacted cerumen, unspecified ear: Secondary | ICD-10-CM | POA: Insufficient documentation

## 2013-07-19 DIAGNOSIS — Z5111 Encounter for antineoplastic chemotherapy: Secondary | ICD-10-CM

## 2013-07-19 LAB — CBC WITH DIFFERENTIAL/PLATELET
Eosinophils Absolute: 0.1 10*3/uL (ref 0.0–0.7)
Eosinophils Relative: 1 % (ref 0–5)
Hemoglobin: 10.7 g/dL — ABNORMAL LOW (ref 13.0–17.0)
Lymphocytes Relative: 13 % (ref 12–46)
Lymphs Abs: 0.9 10*3/uL (ref 0.7–4.0)
MCH: 26.8 pg (ref 26.0–34.0)
MCV: 83.3 fL (ref 78.0–100.0)
Monocytes Relative: 9 % (ref 3–12)
Neutrophils Relative %: 77 % (ref 43–77)
RBC: 4 MIL/uL — ABNORMAL LOW (ref 4.22–5.81)
WBC: 7.2 10*3/uL (ref 4.0–10.5)

## 2013-07-19 LAB — COMPREHENSIVE METABOLIC PANEL
ALT: 9 U/L (ref 0–53)
Alkaline Phosphatase: 84 U/L (ref 39–117)
BUN: 19 mg/dL (ref 6–23)
CO2: 26 mEq/L (ref 19–32)
GFR calc Af Amer: >90 mL/min (ref >90)
GFR calc non Af Amer: 81 mL/min — ABNORMAL LOW (ref >90)
Glucose, Bld: 139 mg/dL — ABNORMAL HIGH (ref 70–99)
Potassium: 3.6 mEq/L (ref 3.5–5.1)
Sodium: 139 mEq/L (ref 135–145)
Total Protein: 6.3 g/dL (ref 6.0–8.3)

## 2013-07-19 LAB — PSA: PSA: 0.17 ng/mL (ref <=4.00)

## 2013-07-19 MED ORDER — LEUPROLIDE ACETATE (3 MONTH) 22.5 MG IM KIT
22.5000 mg | PACK | Freq: Once | INTRAMUSCULAR | Status: AC
  Administered 2013-07-19: 22.5 mg via INTRAMUSCULAR
  Filled 2013-07-19: qty 22.5

## 2013-07-19 NOTE — Patient Instructions (Addendum)
[facility] Discharge Instructions  RECOMMENDATIONS MADE BY THE CONSULTANT AND ANY TEST RESULTS WILL BE SENT TO YOUR REFERRING PHYSICIAN.  EXAM FINDINGS BY THE PHYSICIAN TODAY AND SIGNS OR SYMPTOMS TO REPORT TO CLINIC OR PRIMARY PHYSICIAN:   Return in 3 months for bloodwork and Depu Lupron shot and to see MD    Thank you for choosing [facility] to provide your oncology and hematology care.  To afford each patient quality time with our providers, please arrive at least 15 minutes before your scheduled appointment time.  With your help, our goal is to use those 15 minutes to complete the necessary work-up to ensure our physicians have the information they need to help with your evaluation and healthcare recommendations.    Effective January 1st, 2014, we ask that you re-schedule your appointment with our physicians should you arrive 10 or more minutes late for your appointment.  We strive to give you quality time with our providers, and arriving late affects you and other patients whose appointments are after yours.    Again, thank you for choosing [facility].  Our hope is that these requests will decrease the amount of time that you wait before being seen by our physicians.       _____________________________________________________________  Should you have questions after your visit to [facility], please contact our office at [phone] between the hours of 8:30 a.m. and 5:00 p.m.  Voicemails left after 4:30 p.m. will not be returned until the following business day.  For prescription refill requests, have your pharmacy contact our office with your prescription refill request.

## 2013-07-19 NOTE — Progress Notes (Signed)
Oscar Hickman presents today for injection per MD orders. Depo Lupron 22.5mg  administered IM in left Gluteal. Administration without incident. Patient tolerated well.

## 2013-07-19 NOTE — Progress Notes (Signed)
Labs drawn today for cbc/diff,cmp,psa 

## 2013-07-19 NOTE — Progress Notes (Signed)
Oscar Stalling, MD [address] [place] [address]  ADENOCARCINOMA, PROSTATE - Plan: predniSONE (DELTASONE) 5 MG tablet, CBC with Differential, Comprehensive metabolic panel, PSA, Testosterone, SCHEDULING COMMUNICATION INJECTION, leuprolide (LUPRON) injection 22.5 mg  CURRENT THERAPY: Zytiga/Prednisone daily starting on 07/06/2012 and Depo-Lupron 22.5 mg every 12 weeks.   INTERVAL HISTORY: Oscar Hickman 72 y.o. 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 Indwelling Foley catheter removed. S/P Depo-Lupro and Casodex. 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 S/P Depo-Lupon 22.5 mg every 12 weeks and Casodex daily with failure in therapy which lead to a discontinuation of this therapy to switched to Zytiga/Prednisone starting on 07/06/2012. Depo-Lupron was appropriately restarted on 04/19/2013.  Oscar Hickman was in the hospital from 05/10/2013- 05/13/2013 for a myraid of complaints including ear discomfort, dyspnea, epigastric discomfort, and not feeling well.  He was noted to have uncontrolled glucose in the ED, but was well controlled during hospital stay.  Otherwise, his hospitalization course was benign according to discharge summary.    He is doing well from an oncology standpoint.  He definitely has an element of dementia.  His PCP is Dr. Janna Arch.  He reports a right axillary lesion.  On exam it appears to be a benign cyst.  It does not appear infected.  Edges are easily palpated.  It is in the subcutaneous skin and mobile.  Mildly tender to palpation.   He is tolerating Zytiga/Prednisone + Depo-Lupron well without any issues.  He denies any complaints and ROS questioning is negative.   Past Medical History  Diagnosis Date  . Skin infection     history  . HTN (hypertension)  11/23/2011  . High cholesterol   . Diabetes mellitus   . Chronic headache     daily  . Anxiety   . Osteoarthritis   . COPD (chronic obstructive pulmonary disease)     patient denies  . Peripheral neuropathy   . 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 CHF (congestive heart failure)   . Medical non-compliance   . Heart disease     has ADENOCARCINOMA, PROSTATE; DIABETES MELLITUS, TYPE II; HYPERLIPIDEMIA; HTN (hypertension); Cardiomyopathy, nonischemic; Pyelonephritis; Hydronephrosis of right kidney; and Acute on chronic systolic CHF (congestive heart failure) on his problem list.     has No Known Allergies.  Oscar Hickman does not currently have medications on file.  Past Surgical History  Procedure Laterality Date  . Transurethral resection of prostate  03/2008  . Cardiac catheterization  02/2009    minimal non-obstructive CAD  . Cystoscopy w/ ureteral stent placement Right 04/06/2013    Procedure: CYSTOSCOPY WITH RETROGRADE PYELOGRAM/URETERAL STENT PLACEMENT;  Surgeon: Ky Barban, MD;  Location: [facility];  Service: Urology;  Laterality: Right;  . Cystoscopy w/ ureteral stent removal Right 04/06/2013    Procedure: CYSTOSCOPY WITH STENT REMOVAL;  Surgeon: Ky Barban, MD;  Location: [facility];  Service: Urology;  Laterality: Right;    Denies any headaches, dizziness, double vision, fevers, chills, night sweats, nausea, vomiting, diarrhea, constipation, chest pain, heart palpitations, shortness of breath, blood in stool, black tarry stool, urinary pain, urinary burning, urinary frequency, hematuria.   PHYSICAL EXAMINATION  ECOG PERFORMANCE STATUS: 1 - Symptomatic but completely ambulatory  Filed Vitals:   07/19/13 1136  BP: 150/84  Pulse: 83  Temp: 97.9 F (36.6 C)  Resp: 20    GENERAL:alert, no distress, well nourished, well developed, comfortable, cooperative, obese and smiling, with [employer] baseball cap SKIN: skin color, texture,  turgor are normal, no rashes or significant lesions HEAD: Normocephalic, No masses, lesions, tenderness or abnormalities EYES: normal, PERRLA, EOMI, Conjunctiva are pink and non-injected EARS: External ears normal OROPHARYNX:mucous membranes are moist  NECK: supple, no adenopathy, thyroid normal size, non-tender, without nodularity, no stridor, non-tender, trachea midline LYMPH:  no palpable lymphadenopathy BREAST:breasts appear normal, no suspicious masses, no skin or nipple changes or axillary nodes, not examined LUNGS: clear to auscultation and percussion HEART: regular rate & rhythm, no murmurs, no gallops, S1 normal and S2 normal ABDOMEN:abdomen soft, non-tender, obese, normal bowel sounds, no masses or organomegaly and no hepatosplenomegaly BACK: Back symmetric, no curvature., No CVA tenderness EXTREMITIES:less then 2 second capillary refill, no joint deformities, effusion, or inflammation, no edema, no skin discoloration, no clubbing, no cyanosis, positive findings:  Right axilla subcutaneous lesion measuring 1 cm in size that is mildly tender to palpation, with erythema, mobile. NEURO: alert & oriented x 3 with fluent speech, no focal motor/sensory deficits, gait normal    LABORATORY DATA: CBC    Component Value Date/Time   WBC 7.2 07/19/2013 1137   RBC 4.00* 07/19/2013 1137   RBC 3.67* 04/06/2013 1357   HGB 10.7* 07/19/2013 1137   HCT 33.3* 07/19/2013 1137   PLT 304 07/19/2013 1137   MCV 83.3 07/19/2013 1137   MCH 26.8 07/19/2013 1137   MCHC 32.1 07/19/2013 1137   RDW 17.4* 07/19/2013 1137   LYMPHSABS 0.9 07/19/2013 1137   MONOABS 0.6 07/19/2013 1137   EOSABS 0.1 07/19/2013 1137   BASOSABS 0.0 07/19/2013 1137      Chemistry      Component Value Date/Time   NA 139 07/19/2013 1137   K 3.6 07/19/2013 1137   CL 105 07/19/2013 1137   CO2 26 07/19/2013 1137   BUN 19 07/19/2013 1137   CREATININE 0.96 07/19/2013 1137      Component Value Date/Time   CALCIUM 9.2 07/19/2013 1137   ALKPHOS  84 07/19/2013 1137   AST 14 07/19/2013 1137   ALT 9 07/19/2013 1137   BILITOT 0.4 07/19/2013 1137       ASSESSMENT:  1. 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 Indwelling Foley catheter removed. S/P Depo-Lupro and Casodex. 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 S/P Depo-Lupon 22.5 mg every 12 weeks and Casodex daily with failure in therapy which lead to a discontinuation of this therapy to switched to Zytiga/Prednisone starting on 07/06/2012. Depo-Lupron was appropriately restarted on 04/19/2013. 2. 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 DM  4. HTN  5. Mild dementia  6. Xray evidence of arterial hypertension of chest x-ray on 11/22/2012, will defer to PCP. 7. Right axilla cyst  Patient Active Problem List   Diagnosis Date Noted  . Acute on chronic systolic CHF (congestive heart failure) 05/10/2013  . Pyelonephritis 04/05/2013  . Hydronephrosis of right kidney 04/05/2013  . Cardiomyopathy, nonischemic 02/28/2012  . HTN (hypertension) 11/23/2011  . DIABETES MELLITUS, TYPE II 09/21/2010  . HYPERLIPIDEMIA 09/21/2010  . ADENOCARCINOMA, PROSTATE 06/22/2010     PLAN:  1. I personally reviewed and went over laboratory results with the patient. 2. Labs in 3 months: CBC diff, CMET, PSA, Testosterone 3. Depo-Lupron in 3 months, 22.5 mg 4.  Warm compresses to right axilla 5. Continue Zytiga/Prednisone daily. 6. Return in 3 months for follow-up   THERAPY PLAN:  Will continue with daily Zytiga/Prednisone with Depo-Lupron injection every 3 months.  Will check testosterone level in 3 months to make sure he is fully castrated with present dose of Depo-Lupron.    All questions were answered. The patient knows to call the clinic with any problems, questions or concerns. We can certainly see the patient much sooner if necessary.  Patient and plan discussed  with Dr. Erline Hau and he is in agreement with the aforementioned.   More than 50% of the time spent with the patient was utilized for counseling and coordination of care.   Neill Jurewicz

## 2013-08-13 ENCOUNTER — Other Ambulatory Visit (HOSPITAL_COMMUNITY): Payer: Self-pay | Admitting: Oncology

## 2013-08-20 ENCOUNTER — Other Ambulatory Visit (HOSPITAL_COMMUNITY): Payer: Self-pay | Admitting: Oncology

## 2013-09-10 ENCOUNTER — Other Ambulatory Visit (HOSPITAL_COMMUNITY): Payer: Self-pay | Admitting: Oncology

## 2013-09-10 DIAGNOSIS — C61 Malignant neoplasm of prostate: Secondary | ICD-10-CM

## 2013-09-10 MED ORDER — ABIRATERONE ACETATE 250 MG PO TABS: 1000.0000 mg | ORAL_TABLET | Freq: Every day | ORAL | Status: DC

## 2013-09-20 ENCOUNTER — Other Ambulatory Visit (HOSPITAL_COMMUNITY): Payer: Self-pay | Admitting: Oncology

## 2013-09-20 DIAGNOSIS — C61 Malignant neoplasm of prostate: Secondary | ICD-10-CM

## 2013-09-20 MED ORDER — PREDNISONE 5 MG PO TABS: 5.0000 mg | ORAL_TABLET | Freq: Two times a day (BID) | ORAL | Status: DC

## 2013-09-22 ENCOUNTER — Emergency Department (HOSPITAL_COMMUNITY)
Admission: EM | Admit: 2013-09-22 | Discharge: 2013-09-22 | Disposition: A | Discharge status: Home / Self Care | Payer: Medicare Other | Attending: Emergency Medicine | Admitting: Emergency Medicine

## 2013-09-22 ENCOUNTER — Encounter (HOSPITAL_COMMUNITY): Payer: Self-pay | Admitting: Emergency Medicine

## 2013-09-22 ENCOUNTER — Emergency Department (HOSPITAL_COMMUNITY): Payer: Medicare Other

## 2013-09-22 DIAGNOSIS — I10 Essential (primary) hypertension: Secondary | ICD-10-CM | POA: Insufficient documentation

## 2013-09-22 DIAGNOSIS — IMO0002 Reserved for concepts with insufficient information to code with codable children: Secondary | ICD-10-CM | POA: Insufficient documentation

## 2013-09-22 DIAGNOSIS — Z872 Personal history of diseases of the skin and subcutaneous tissue: Secondary | ICD-10-CM | POA: Insufficient documentation

## 2013-09-22 DIAGNOSIS — M546 Pain in thoracic spine: Secondary | ICD-10-CM | POA: Insufficient documentation

## 2013-09-22 DIAGNOSIS — J441 Chronic obstructive pulmonary disease with (acute) exacerbation: Secondary | ICD-10-CM | POA: Insufficient documentation

## 2013-09-22 DIAGNOSIS — Z91199 Patient's noncompliance with other medical treatment and regimen due to unspecified reason: Secondary | ICD-10-CM | POA: Insufficient documentation

## 2013-09-22 DIAGNOSIS — Z794 Long term (current) use of insulin: Secondary | ICD-10-CM | POA: Insufficient documentation

## 2013-09-22 DIAGNOSIS — Z7982 Long term (current) use of aspirin: Secondary | ICD-10-CM | POA: Insufficient documentation

## 2013-09-22 DIAGNOSIS — Z79899 Other long term (current) drug therapy: Secondary | ICD-10-CM | POA: Insufficient documentation

## 2013-09-22 DIAGNOSIS — M549 Dorsalgia, unspecified: Secondary | ICD-10-CM

## 2013-09-22 DIAGNOSIS — Z9119 Patient's noncompliance with other medical treatment and regimen: Secondary | ICD-10-CM | POA: Insufficient documentation

## 2013-09-22 DIAGNOSIS — E119 Type 2 diabetes mellitus without complications: Secondary | ICD-10-CM | POA: Insufficient documentation

## 2013-09-22 DIAGNOSIS — I509 Heart failure, unspecified: Secondary | ICD-10-CM | POA: Insufficient documentation

## 2013-09-22 DIAGNOSIS — Z8546 Personal history of malignant neoplasm of prostate: Secondary | ICD-10-CM | POA: Insufficient documentation

## 2013-09-22 DIAGNOSIS — G8929 Other chronic pain: Secondary | ICD-10-CM | POA: Insufficient documentation

## 2013-09-22 DIAGNOSIS — M1990 Unspecified osteoarthritis, unspecified site: Secondary | ICD-10-CM | POA: Insufficient documentation

## 2013-09-22 DIAGNOSIS — Z9889 Other specified postprocedural states: Secondary | ICD-10-CM | POA: Insufficient documentation

## 2013-09-22 DIAGNOSIS — R1084 Generalized abdominal pain: Secondary | ICD-10-CM | POA: Insufficient documentation

## 2013-09-22 DIAGNOSIS — R51 Headache: Secondary | ICD-10-CM | POA: Insufficient documentation

## 2013-09-22 DIAGNOSIS — Z8659 Personal history of other mental and behavioral disorders: Secondary | ICD-10-CM | POA: Insufficient documentation

## 2013-09-22 LAB — COMPREHENSIVE METABOLIC PANEL
ALT: 8 U/L (ref 0–53)
AST: 11 U/L (ref 0–37)
Albumin: 3.1 g/dL — ABNORMAL LOW (ref 3.5–5.2)
CO2: 27 mEq/L (ref 19–32)
Calcium: 8.9 mg/dL (ref 8.4–10.5)
Sodium: 139 mEq/L (ref 135–145)
Total Protein: 6.2 g/dL (ref 6.0–8.3)

## 2013-09-22 LAB — CBC WITH DIFFERENTIAL/PLATELET
Basophils Absolute: 0 10*3/uL (ref 0.0–0.1)
Basophils Relative: 0 % (ref 0–1)
Eosinophils Absolute: 0.1 10*3/uL (ref 0.0–0.7)
Eosinophils Relative: 2 % (ref 0–5)
HCT: 33.2 % — ABNORMAL LOW (ref 39.0–52.0)
Lymphocytes Relative: 12 % (ref 12–46)
MCHC: 32.2 g/dL (ref 30.0–36.0)
MCV: 82.8 fL (ref 78.0–100.0)
Monocytes Absolute: 0.5 10*3/uL (ref 0.1–1.0)
Neutrophils Relative %: 76 % (ref 43–77)
Platelets: 304 10*3/uL (ref 150–400)
RDW: 18.1 % — ABNORMAL HIGH (ref 11.5–15.5)
WBC: 5.4 10*3/uL (ref 4.0–10.5)

## 2013-09-22 LAB — D-DIMER, QUANTITATIVE: D-Dimer, Quant: <0.27 ug/mL-FEU (ref 0.00–0.48)

## 2013-09-22 MED ORDER — ONDANSETRON HCL 4 MG/2ML IJ SOLN
4.0000 mg | Freq: Once | INTRAMUSCULAR | Status: AC
  Administered 2013-09-22: 4 mg via INTRAVENOUS
  Filled 2013-09-22: qty 2

## 2013-09-22 MED ORDER — HYDROMORPHONE HCL PF 1 MG/ML IJ SOLN
1.0000 mg | Freq: Once | INTRAMUSCULAR | Status: AC
  Administered 2013-09-22: 1 mg via INTRAVENOUS
  Filled 2013-09-22: qty 1

## 2013-09-22 MED ORDER — ONDANSETRON HCL 4 MG/2ML IJ SOLN
INTRAMUSCULAR | Status: AC
  Filled 2013-09-22: qty 2

## 2013-09-22 MED ORDER — HYDROCODONE-ACETAMINOPHEN 5-325 MG PO TABS: 1.0000 | ORAL_TABLET | Freq: Four times a day (QID) | ORAL | Status: DC | PRN

## 2013-09-22 MED ORDER — ONDANSETRON HCL 4 MG/2ML IJ SOLN
4.0000 mg | Freq: Once | INTRAMUSCULAR | Status: AC
  Administered 2013-09-22: 4 mg via INTRAVENOUS

## 2013-09-22 MED ORDER — ONDANSETRON 4 MG PO TBDP: ORAL_TABLET | ORAL | Status: DC

## 2013-09-22 NOTE — ED Provider Notes (Signed)
CSN: [id number]     Arrival date & time 09/22/13  1041 History  This chart was scribed for Oscar Lennert, MD by Ardelia Mems, ED Scribe. This patient was seen in room APA07/[facility]07 and the patient's care was started at 11:23 AM.   Chief Complaint  Patient presents with  . Headache  . Abdominal Pain  . Back Pain    Patient is a 72 y.o. male presenting with back pain. The history is provided by the patient. No language interpreter was used.  Back Pain Location:  Thoracic spine Quality:  Unable to specify Radiates to:  Does not radiate Pain severity:  Moderate Onset quality:  Gradual Duration:  3 days Timing:  Intermittent Progression:  Worsening Chronicity:  New Context: recent illness (cold)   Relieved by:  None tried Worsened by:  Bending Ineffective treatments:  None tried Associated symptoms: abdominal pain and headaches   Associated symptoms: no bladder incontinence, no bowel incontinence, no chest pain and no fever     HPI Comments: Oscar Hickman is a 72 y.o. male with a history of CHF, COPD, HTN and DM who presents to the Emergency Department complaining of gradually worsening, constant, moderate upper middle back pain onset 3 days ago. He states that he has no history of similar pain. Pt states that he has had a cold recently, with a cough productive of sputum. He also reports intermittent SOB recently. He also states that he has been having a generalized headache and generalized abdominal pain recenty. He denies fever, chills or any other symptoms.  PCP- Dr. Oval Linsey   Past Medical History  Diagnosis Date  . Skin infection     history  . HTN (hypertension) 11/23/2011  . High cholesterol   . Diabetes mellitus   . Chronic headache     daily  . Anxiety   . Osteoarthritis   . COPD (chronic obstructive pulmonary disease)     patient denies  . Peripheral neuropathy   . 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 CHF (congestive  heart failure)   . Medical non-compliance   . Heart disease    Past Surgical History  Procedure Laterality Date  . Transurethral resection of prostate  03/2008  . Cardiac catheterization  02/2009    minimal non-obstructive CAD  . Cystoscopy w/ ureteral stent placement Right 04/06/2013    Procedure: CYSTOSCOPY WITH RETROGRADE PYELOGRAM/URETERAL STENT PLACEMENT;  Surgeon: Ky Barban, MD;  Location: [facility];  Service: Urology;  Laterality: Right;  . Cystoscopy w/ ureteral stent removal Right 04/06/2013    Procedure: CYSTOSCOPY WITH STENT REMOVAL;  Surgeon: Ky Barban, MD;  Location: [facility];  Service: Urology;  Laterality: Right;   Family History  Problem Relation Age of Onset  . Cancer Mother   . Cancer Father    History  Substance Use Topics  . Smoking status: Never Smoker   . Smokeless tobacco: Never Used  . Alcohol Use: No    Review of Systems  Constitutional: Negative for fever and appetite change.  HENT: Negative for ear discharge.   Eyes: Negative for discharge.  Respiratory: Positive for cough and shortness of breath.   Cardiovascular: Negative for chest pain.  Gastrointestinal: Positive for abdominal pain. Negative for bowel incontinence.  Genitourinary: Negative for bladder incontinence and frequency.  Musculoskeletal: Positive for back pain.  Skin: Negative for rash.  Neurological: Positive for headaches.  Psychiatric/Behavioral: Negative for hallucinations.    Allergies  Review  of patient's allergies indicates no known allergies.  Home Medications   Current Outpatient Rx  Name  Route  Sig  Dispense  Refill  . abiraterone Acetate (ZYTIGA) 250 MG tablet   Oral   Take 4 tablets (1,000 mg total) by mouth daily. Take on an empty stomach 1 hour before or 2 hours after a meal   120 tablet   0   . acetaminophen (TYLENOL) 500 MG tablet   Oral   Take 500 mg by mouth every 6 (six) hours as needed.         Marland Kitchen amLODipine (NORVASC) 10 MG tablet   Oral    Take 10 mg by mouth daily.         Marland Kitchen aspirin EC 81 MG tablet   Oral   Take 81 mg by mouth daily.         . carvedilol (COREG) 25 MG tablet   Oral   Take 25 mg by mouth 2 (two) times daily with a meal.         . hydrALAZINE (APRESOLINE) 50 MG tablet   Oral   Take 50 mg by mouth 3 (three) times daily.         Marland Kitchen ibuprofen (ADVIL,MOTRIN) 200 MG tablet   Oral   Take 400 mg by mouth every 6 (six) hours as needed.         . insulin glargine (LANTUS) 100 UNIT/ML injection   Subcutaneous   Inject 25-26 Units into the skin daily.          . isosorbide dinitrate (ISORDIL) 20 MG tablet   Oral   Take 20 mg by mouth 3 (three) times daily.          . metFORMIN (GLUCOPHAGE) 500 MG tablet   Oral   Take 500 mg by mouth 2 (two) times daily with a meal.           . predniSONE (DELTASONE) 5 MG tablet   Oral   Take 1 tablet (5 mg total) by mouth 2 (two) times daily with a meal.   60 tablet   0   . quinapril (ACCUPRIL) 40 MG tablet   Oral   Take 40 mg by mouth daily.           Marland Kitchen HYDROcodone-acetaminophen (NORCO/VICODIN) 5-325 MG per tablet   Oral   Take 1 tablet by mouth every 6 (six) hours as needed for moderate pain.   20 tablet   0   . ondansetron (ZOFRAN ODT) 4 MG disintegrating tablet      4mg  ODT q4 hours prn nausea/vomit   12 tablet   0    Triage Vitals: BP 149/95  Pulse 84  Temp(Src) 98.2 F (36.8 C) (Oral)  Resp 18  Ht 5\' 8"  (1.727 m)  Wt 202 lb (91.627 kg)  BMI 30.72 kg/m2  SpO2 100%  Physical Exam  Constitutional: He is oriented to person, place, and time. He appears well-developed.  HENT:  Head: Normocephalic.  Eyes: Conjunctivae and EOM are normal. No scleral icterus.  Neck: Neck supple. No thyromegaly present.  Cardiovascular: Normal rate and regular rhythm.  Exam reveals no gallop and no friction rub.   No murmur heard. Pulmonary/Chest: No stridor. He has no wheezes. He has no rales. He exhibits no tenderness.  Abdominal: He exhibits  no distension. There is no tenderness. There is no rebound.  Musculoskeletal: Normal range of motion. He exhibits tenderness. He exhibits no edema.  Mild mid-thoracic tenderness to  palpation.  Lymphadenopathy:    He has no cervical adenopathy.  Neurological: He is oriented to person, place, and time. He exhibits normal muscle tone. Coordination normal.  Skin: No rash noted. No erythema.  Psychiatric: He has a normal mood and affect. His behavior is normal.    ED Course  Procedures (including critical care time)  DIAGNOSTIC STUDIES: Oxygen Saturation is 100% on RA, normal by my interpretation.    COORDINATION OF CARE: 11:27AM- Will order Dilaudid and Zofran. Will obtain diagnostic lab work and a CXR. Pt advised of plan for treatment and pt agrees.  Medications  ondansetron (ZOFRAN) 4 MG/2ML injection (not administered)  HYDROmorphone (DILAUDID) injection 1 mg (1 mg Intravenous Given 09/22/13 1143)  ondansetron (ZOFRAN) injection 4 mg (4 mg Intravenous Given 09/22/13 1143)  ondansetron (ZOFRAN) injection 4 mg (4 mg Intravenous Given 09/22/13 1423)   Labs Review Labs Reviewed  CBC WITH DIFFERENTIAL - Abnormal; Notable for the following:    RBC 4.01 (*)    Hemoglobin 10.7 (*)    HCT 33.2 (*)    RDW 18.1 (*)    All other components within normal limits  COMPREHENSIVE METABOLIC PANEL - Abnormal; Notable for the following:    Potassium 3.0 (*)    Glucose, Bld 134 (*)    Albumin 3.1 (*)    GFR calc non Af Amer 88 (*)    All other components within normal limits  PRO B NATRIURETIC PEPTIDE - Abnormal; Notable for the following:    Pro B Natriuretic peptide (BNP) 1233.0 (*)    All other components within normal limits  TROPONIN I  D-DIMER, QUANTITATIVE   Imaging Review Dg Chest 2 View  09/22/2013   CLINICAL DATA:  Abdominal pain, chest pain and headache  EXAM: CHEST  2 VIEW  COMPARISON:  CT chest 05/10/2013, chest x-ray 04/06/2013.  FINDINGS: There is cardiomegaly. There is  enlargement of the central pulmonary vasculature. There is no focal consolidation, pleural effusion or pneumothorax. The osseous structures are normal.  IMPRESSION: No active cardiopulmonary disease.  Cardiomegaly.   Electronically Signed   By: Elige Ko   On: 09/22/2013 11:18    EKG Interpretation   None       MDM   1. Back pain    The chart was scribed for me under my direct supervision.  I personally performed the history, physical, and medical decision making and all procedures in the evaluation of this patient.Oscar Lennert, MD 09/25/13 [phone]

## 2013-09-22 NOTE — ED Notes (Signed)
Patient with no complaints at this time. Respirations even and unlabored. Skin warm/dry. Discharge instructions reviewed with patient at this time by Victorino Dike, RN. Patient given opportunity to voice concerns/ask questions. IV removed per policy and band-aid applied to site. Patient discharged at this time and left Emergency Department via wheelchair.

## 2013-09-22 NOTE — ED Notes (Addendum)
Patient cannot tell me when HA started, but it began in back of head and now radiates bilaterally to front of head. Denies photophobia.  No slurring of speech noted.  C/O mid thoracic back pain and abdominal pain.  States pain in back started 3 days ago and hurts more when bending over.  Abdomen is "uncomfortable" with deep breath.  Relates some SOB. Denies n/v/d, fever or chills.  Coughing up white sputum.

## 2013-10-15 ENCOUNTER — Other Ambulatory Visit (HOSPITAL_COMMUNITY): Payer: Medicare Other

## 2013-10-16 ENCOUNTER — Other Ambulatory Visit (HOSPITAL_COMMUNITY): Payer: Self-pay | Admitting: Oncology

## 2013-10-16 DIAGNOSIS — C61 Malignant neoplasm of prostate: Secondary | ICD-10-CM

## 2013-10-16 MED ORDER — ABIRATERONE ACETATE 250 MG PO TABS: 1000.0000 mg | ORAL_TABLET | Freq: Every day | ORAL | Status: DC

## 2013-10-18 NOTE — Progress Notes (Signed)
[name], MD [address] [place] [address]  ADENOCARCINOMA, PROSTATE  CURRENT THERAPY:Zytiga/Prednisone daily starting on 07/06/2012 and Depo-Lupron 22.5 mg every 12 weeks.   INTERVAL HISTORY: Oscar Hickman 72 y.o. 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 Indwelling Foley catheter removed. S/P Depo-Lupro and Casodex. 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 S/P Depo-Lupon 22.5 mg every 12 weeks and Casodex daily with failure in therapy which lead to a discontinuation of this therapy to switched to Zytiga/Prednisone starting on 07/06/2012. Depo-Lupron was appropriately restarted on 04/19/2013.  Oscar Hickman is doing well.  He denies any complaints during conversation today.  He denies any complaints.  He is tolerating therapy well without any toxicities.   We drew labs today, but I reviewed his most recent lab results with him.  I personally reviewed and went over laboratory results with the patient.   Lab Results  Component Value Date   PSA 0.17 07/19/2013   PSA 0.27 04/19/2013   PSA 2.37 01/17/2013   His PSA is stable.  Oncologically, he denies any complaints and ROS questioning is negative.   Past Medical History  Diagnosis Date  . Skin infection     history  . HTN (hypertension) 11/23/2011  . High cholesterol   . Diabetes mellitus   . Chronic headache     daily  . Anxiety   . Osteoarthritis   . COPD (chronic obstructive pulmonary disease)     patient denies  . Peripheral neuropathy   . 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 CHF (congestive heart failure)   . Medical non-compliance   . Heart disease     has ADENOCARCINOMA, PROSTATE; DIABETES MELLITUS, TYPE II; HYPERLIPIDEMIA; HTN (hypertension); Cardiomyopathy, nonischemic;  Pyelonephritis; Hydronephrosis of right kidney; and Acute on chronic systolic CHF (congestive heart failure) on his problem list.     has No Known Allergies.  Oscar Hickman does not currently have medications on file.  Past Surgical History  Procedure Laterality Date  . Transurethral resection of prostate  03/2008  . Cardiac catheterization  02/2009    minimal non-obstructive CAD  . Cystoscopy w/ ureteral stent placement Right 04/06/2013    Procedure: CYSTOSCOPY WITH RETROGRADE PYELOGRAM/URETERAL STENT PLACEMENT;  Surgeon: Ky Barban, MD;  Location: [facility];  Service: Urology;  Laterality: Right;  . Cystoscopy w/ ureteral stent removal Right 04/06/2013    Procedure: CYSTOSCOPY WITH STENT REMOVAL;  Surgeon: Ky Barban, MD;  Location: [facility];  Service: Urology;  Laterality: Right;    Denies any headaches, dizziness, double vision, fevers, chills, night sweats, nausea, vomiting, diarrhea, constipation, chest pain, heart palpitations, shortness of breath, blood in stool, black tarry stool, urinary pain, urinary burning, urinary frequency, hematuria.   PHYSICAL EXAMINATION  ECOG PERFORMANCE STATUS: 1 - Symptomatic but completely ambulatory  Filed Vitals:   10/19/13 1124  BP: 157/88  Pulse: 84  Temp: 97.9 F (36.6 C)  Resp: 16    GENERAL:alert, no distress, well nourished, well developed, comfortable, cooperative, obese and smiling SKIN: skin color, texture, turgor are normal, no rashes or significant lesions HEAD: Normocephalic, No masses, lesions, tenderness or abnormalities EYES: normal, PERRLA, EOMI, Conjunctiva are pink and non-injected EARS: External ears normal OROPHARYNX:mucous membranes are moist  NECK: supple, trachea midline LYMPH:  no palpable lymphadenopathy BREAST:not  examined LUNGS: clear to auscultation and percussion HEART: regular rate & rhythm, no murmurs, no gallops, S1 normal and S2 normal ABDOMEN:abdomen soft, non-tender, obese and normal bowel  sounds BACK: Back symmetric, no curvature. EXTREMITIES:less then 2 second capillary refill, no joint deformities, effusion, or inflammation, no edema, no skin discoloration, no clubbing, no cyanosis  NEURO: alert & oriented x 3 with fluent speech, no focal motor/sensory deficits, gait normal    LABORATORY DATA: CBC    Component Value Date/Time   WBC 5.8 10/19/2013 1111   RBC 4.15* 10/19/2013 1111   RBC 3.67* 04/06/2013 1357   HGB 11.0* 10/19/2013 1111   HCT 34.9* 10/19/2013 1111   PLT 313 10/19/2013 1111   MCV 84.1 10/19/2013 1111   MCH 26.5 10/19/2013 1111   MCHC 31.5 10/19/2013 1111   RDW 18.2* 10/19/2013 1111   LYMPHSABS 1.0 10/19/2013 1111   MONOABS 0.6 10/19/2013 1111   EOSABS 0.1 10/19/2013 1111   BASOSABS 0.0 10/19/2013 1111      Chemistry      Component Value Date/Time   NA 139 09/22/2013 1150   K 3.0* 09/22/2013 1150   CL 103 09/22/2013 1150   CO2 27 09/22/2013 1150   BUN 18 09/22/2013 1150   CREATININE 0.80 09/22/2013 1150      Component Value Date/Time   CALCIUM 8.9 09/22/2013 1150   ALKPHOS 81 09/22/2013 1150   AST 11 09/22/2013 1150   ALT 8 09/22/2013 1150   BILITOT 0.5 09/22/2013 1150     Lab Results  Component Value Date   PSA 0.17 07/19/2013   PSA 0.27 04/19/2013   PSA 2.37 01/17/2013      RADIOGRAPHIC STUDIES:  09/22/2013  CLINICAL DATA: Abdominal pain, chest pain and headache  EXAM:  CHEST 2 VIEW  COMPARISON: CT chest 05/10/2013, chest x-ray 04/06/2013.  FINDINGS:  There is cardiomegaly. There is enlargement of the central pulmonary  vasculature. There is no focal consolidation, pleural effusion or  pneumothorax. The osseous structures are normal.  IMPRESSION:  No active cardiopulmonary disease.  Cardiomegaly.  Electronically Signed  By: Elige Ko  On: 09/22/2013 11:18     ASSESSMENT:  1. 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 Indwelling  Foley catheter removed. S/P Depo-Lupro and Casodex. 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 S/P Depo-Lupon 22.5 mg every 12 weeks and Casodex daily with failure in therapy which lead to a discontinuation of this therapy to switched to Zytiga/Prednisone starting on 07/06/2012. Depo-Lupron was appropriately restarted on 04/19/2013.  2. 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 DM  4. HTN  5. Mild dementia  6. Xray evidence of arterial hypertension of chest x-ray on 11/22/2012, will defer to PCP.    Patient Active Problem List   Diagnosis Date Noted  . Acute on chronic systolic CHF (congestive heart failure) 05/10/2013  . Pyelonephritis 04/05/2013  . Hydronephrosis of right kidney 04/05/2013  . Cardiomyopathy, nonischemic 02/28/2012  . HTN (hypertension) 11/23/2011  . DIABETES MELLITUS, TYPE II 09/21/2010  . HYPERLIPIDEMIA 09/21/2010  . ADENOCARCINOMA, PROSTATE 06/22/2010     PLAN:  1. I personally reviewed and went over laboratory results with the patient.  2. Labs in 3 months: CBC diff, CMET, PSA 3. Depo-Lupron injection today 4. Depo-Lupron in 3 months, 22.5 mg  5. Continue Zytiga/Prednisone daily.  6. Return in 3 months for follow-up   THERAPY PLAN:  His PSA is well-controlled on  current regimen of Depo-Lupron every 3 months and Zytiga/Prednisone daily.    All questions were answered. The patient knows to call the clinic with any problems, questions or concerns. We can certainly see the patient much sooner if necessary.  Patient and plan discussed with Dr. Alla German and he is in agreement with the aforementioned.   Tyriq Moragne

## 2013-10-19 ENCOUNTER — Encounter (HOSPITAL_COMMUNITY): Payer: Self-pay | Admitting: Oncology

## 2013-10-19 ENCOUNTER — Encounter (HOSPITAL_BASED_OUTPATIENT_CLINIC_OR_DEPARTMENT_OTHER): Payer: Medicare Other | Admitting: Oncology

## 2013-10-19 ENCOUNTER — Encounter (HOSPITAL_COMMUNITY): Payer: Medicare Other

## 2013-10-19 ENCOUNTER — Encounter (HOSPITAL_COMMUNITY): Payer: Medicare Other | Attending: Hematology and Oncology

## 2013-10-19 VITALS — BP 157/88 | HR 84 | Temp 97.9°F | Resp 16

## 2013-10-19 DIAGNOSIS — Z5111 Encounter for antineoplastic chemotherapy: Secondary | ICD-10-CM

## 2013-10-19 DIAGNOSIS — C61 Malignant neoplasm of prostate: Secondary | ICD-10-CM

## 2013-10-19 DIAGNOSIS — I10 Essential (primary) hypertension: Secondary | ICD-10-CM

## 2013-10-19 DIAGNOSIS — E119 Type 2 diabetes mellitus without complications: Secondary | ICD-10-CM

## 2013-10-19 DIAGNOSIS — C7919 Secondary malignant neoplasm of other urinary organs: Secondary | ICD-10-CM

## 2013-10-19 LAB — COMPREHENSIVE METABOLIC PANEL
AST: 13 U/L (ref 0–37)
BUN: 18 mg/dL (ref 6–23)
CO2: 30 mEq/L (ref 19–32)
Calcium: 9.1 mg/dL (ref 8.4–10.5)
Chloride: 104 mEq/L (ref 96–112)
Creatinine, Ser: 0.93 mg/dL (ref 0.50–1.35)
GFR calc non Af Amer: 83 mL/min — ABNORMAL LOW (ref >90)
Glucose, Bld: 143 mg/dL — ABNORMAL HIGH (ref 70–99)
Total Bilirubin: 0.4 mg/dL (ref 0.3–1.2)

## 2013-10-19 LAB — CBC WITH DIFFERENTIAL/PLATELET
Eosinophils Relative: 2 % (ref 0–5)
HCT: 34.9 % — ABNORMAL LOW (ref 39.0–52.0)
Hemoglobin: 11 g/dL — ABNORMAL LOW (ref 13.0–17.0)
Lymphocytes Relative: 18 % (ref 12–46)
Lymphs Abs: 1 10*3/uL (ref 0.7–4.0)
Monocytes Absolute: 0.6 10*3/uL (ref 0.1–1.0)
Monocytes Relative: 11 % (ref 3–12)
Neutro Abs: 4 10*3/uL (ref 1.7–7.7)
RBC: 4.15 MIL/uL — ABNORMAL LOW (ref 4.22–5.81)
WBC: 5.8 10*3/uL (ref 4.0–10.5)

## 2013-10-19 LAB — TESTOSTERONE: Testosterone: <10 ng/dL — ABNORMAL LOW (ref 300–890)

## 2013-10-19 MED ORDER — LEUPROLIDE ACETATE (3 MONTH) 22.5 MG IM KIT
22.5000 mg | PACK | Freq: Once | INTRAMUSCULAR | Status: AC
  Administered 2013-10-19: 22.5 mg via INTRAMUSCULAR
  Filled 2013-10-19: qty 22.5

## 2013-10-19 NOTE — Progress Notes (Signed)
Labs drawn today for cbc/diff,ccmp,psa,testosterone

## 2013-10-19 NOTE — Patient Instructions (Signed)
[facility] Discharge Instructions  RECOMMENDATIONS MADE BY THE CONSULTANT AND ANY TEST RESULTS WILL BE SENT TO YOUR REFERRING PHYSICIAN.  EXAM FINDINGS BY THE PHYSICIAN TODAY AND SIGNS OR SYMPTOMS TO REPORT TO CLINIC OR PRIMARY PHYSICIAN: Exam and findings as discussed by Dellis Anes, PA-C.  Will give you your lupron injection today.  Report any new lumps, bone pain or other problems.  MEDICATIONS PRESCRIBED:  none  INSTRUCTIONS/FOLLOW-UP: Follow-up in 3 months with blood work, injection and office visit.  Thank you for choosing [facility] to provide your oncology and hematology care.  To afford each patient quality time with our providers, please arrive at least 15 minutes before your scheduled appointment time.  With your help, our goal is to use those 15 minutes to complete the necessary work-up to ensure our physicians have the information they need to help with your evaluation and healthcare recommendations.    Effective January 1st, 2014, we ask that you re-schedule your appointment with our physicians should you arrive 10 or more minutes late for your appointment.  We strive to give you quality time with our providers, and arriving late affects you and other patients whose appointments are after yours.    Again, thank you for choosing [facility].  Our hope is that these requests will decrease the amount of time that you wait before being seen by our physicians.       _____________________________________________________________  Should you have questions after your visit to [facility], please contact our office at [phone] between the hours of 8:30 a.m. and 5:00 p.m.  Voicemails left after 4:30 p.m. will not be returned until the following business day.  For prescription refill requests, have your pharmacy contact our office with your prescription refill request.

## 2013-10-19 NOTE — Progress Notes (Signed)
Oscar Hickman presents today for injection per MD orders. Lupron 22.5 mg administered IM Z-track in right Gluteal. Administration without incident. Patient tolerated well.

## 2013-10-20 LAB — PSA: PSA: 0.29 ng/mL (ref <=4.00)

## 2013-10-22 ENCOUNTER — Other Ambulatory Visit (HOSPITAL_COMMUNITY): Payer: Self-pay | Admitting: Oncology

## 2013-10-22 DIAGNOSIS — C61 Malignant neoplasm of prostate: Secondary | ICD-10-CM

## 2013-10-22 MED ORDER — PREDNISONE 5 MG PO TABS: 5.0000 mg | ORAL_TABLET | Freq: Two times a day (BID) | ORAL | Status: DC

## 2013-10-30 ENCOUNTER — Emergency Department (HOSPITAL_COMMUNITY)
Admission: EM | Admit: 2013-10-30 | Discharge: 2013-10-30 | Disposition: A | Discharge status: Home / Self Care | Payer: Medicare Other | Attending: Emergency Medicine | Admitting: Emergency Medicine

## 2013-10-30 ENCOUNTER — Emergency Department (HOSPITAL_COMMUNITY): Payer: Medicare Other

## 2013-10-30 ENCOUNTER — Encounter (HOSPITAL_COMMUNITY): Payer: Self-pay | Admitting: Emergency Medicine

## 2013-10-30 DIAGNOSIS — R0600 Dyspnea, unspecified: Secondary | ICD-10-CM

## 2013-10-30 DIAGNOSIS — Z7982 Long term (current) use of aspirin: Secondary | ICD-10-CM | POA: Insufficient documentation

## 2013-10-30 DIAGNOSIS — Z794 Long term (current) use of insulin: Secondary | ICD-10-CM | POA: Insufficient documentation

## 2013-10-30 DIAGNOSIS — Z91199 Patient's noncompliance with other medical treatment and regimen due to unspecified reason: Secondary | ICD-10-CM | POA: Insufficient documentation

## 2013-10-30 DIAGNOSIS — Z8669 Personal history of other diseases of the nervous system and sense organs: Secondary | ICD-10-CM | POA: Insufficient documentation

## 2013-10-30 DIAGNOSIS — Z79899 Other long term (current) drug therapy: Secondary | ICD-10-CM | POA: Insufficient documentation

## 2013-10-30 DIAGNOSIS — Z8546 Personal history of malignant neoplasm of prostate: Secondary | ICD-10-CM | POA: Insufficient documentation

## 2013-10-30 DIAGNOSIS — IMO0002 Reserved for concepts with insufficient information to code with codable children: Secondary | ICD-10-CM | POA: Insufficient documentation

## 2013-10-30 DIAGNOSIS — M1990 Unspecified osteoarthritis, unspecified site: Secondary | ICD-10-CM | POA: Insufficient documentation

## 2013-10-30 DIAGNOSIS — J441 Chronic obstructive pulmonary disease with (acute) exacerbation: Secondary | ICD-10-CM | POA: Insufficient documentation

## 2013-10-30 DIAGNOSIS — R609 Edema, unspecified: Secondary | ICD-10-CM | POA: Insufficient documentation

## 2013-10-30 DIAGNOSIS — Z95818 Presence of other cardiac implants and grafts: Secondary | ICD-10-CM | POA: Insufficient documentation

## 2013-10-30 DIAGNOSIS — Z872 Personal history of diseases of the skin and subcutaneous tissue: Secondary | ICD-10-CM | POA: Insufficient documentation

## 2013-10-30 DIAGNOSIS — Z8659 Personal history of other mental and behavioral disorders: Secondary | ICD-10-CM | POA: Insufficient documentation

## 2013-10-30 DIAGNOSIS — I509 Heart failure, unspecified: Secondary | ICD-10-CM | POA: Insufficient documentation

## 2013-10-30 DIAGNOSIS — E119 Type 2 diabetes mellitus without complications: Secondary | ICD-10-CM | POA: Insufficient documentation

## 2013-10-30 DIAGNOSIS — Z9119 Patient's noncompliance with other medical treatment and regimen: Secondary | ICD-10-CM | POA: Insufficient documentation

## 2013-10-30 DIAGNOSIS — I10 Essential (primary) hypertension: Secondary | ICD-10-CM | POA: Insufficient documentation

## 2013-10-30 LAB — BASIC METABOLIC PANEL
Calcium: 8.8 mg/dL (ref 8.4–10.5)
Chloride: 102 mEq/L (ref 96–112)
Creatinine, Ser: 0.94 mg/dL (ref 0.50–1.35)
GFR calc Af Amer: >90 mL/min (ref >90)
GFR calc non Af Amer: 82 mL/min — ABNORMAL LOW (ref >90)
Glucose, Bld: 242 mg/dL — ABNORMAL HIGH (ref 70–99)
Potassium: 3.7 mEq/L (ref 3.5–5.1)

## 2013-10-30 LAB — CBC WITH DIFFERENTIAL/PLATELET
Basophils Absolute: 0 10*3/uL (ref 0.0–0.1)
Basophils Relative: 0 % (ref 0–1)
Eosinophils Absolute: 0.1 10*3/uL (ref 0.0–0.7)
Eosinophils Relative: 1 % (ref 0–5)
HCT: 33.1 % — ABNORMAL LOW (ref 39.0–52.0)
Hemoglobin: 10.5 g/dL — ABNORMAL LOW (ref 13.0–17.0)
Lymphs Abs: 0.6 10*3/uL — ABNORMAL LOW (ref 0.7–4.0)
MCH: 26.5 pg (ref 26.0–34.0)
MCHC: 31.7 g/dL (ref 30.0–36.0)
Monocytes Absolute: 0.4 10*3/uL (ref 0.1–1.0)
Monocytes Relative: 7 % (ref 3–12)
Neutro Abs: 5.3 10*3/uL (ref 1.7–7.7)
Platelets: 334 10*3/uL (ref 150–400)
RDW: 18.2 % — ABNORMAL HIGH (ref 11.5–15.5)

## 2013-10-30 LAB — D-DIMER, QUANTITATIVE (NOT AT ARMC): D-Dimer, Quant: <0.27 ug/mL-FEU (ref 0.00–0.48)

## 2013-10-30 LAB — TROPONIN I: Troponin I: <0.3 ng/mL (ref <0.30)

## 2013-10-30 MED ORDER — ALBUTEROL SULFATE (5 MG/ML) 0.5% IN NEBU
5.0000 mg | INHALATION_SOLUTION | Freq: Once | RESPIRATORY_TRACT | Status: AC
  Administered 2013-10-30: 5 mg via RESPIRATORY_TRACT
  Filled 2013-10-30: qty 1

## 2013-10-30 MED ORDER — ALBUTEROL SULFATE HFA 108 (90 BASE) MCG/ACT IN AERS: 2.0000 | INHALATION_SPRAY | RESPIRATORY_TRACT | Status: DC | PRN

## 2013-10-30 MED ORDER — IPRATROPIUM BROMIDE 0.02 % IN SOLN
0.5000 mg | Freq: Once | RESPIRATORY_TRACT | Status: AC
  Administered 2013-10-30: 0.5 mg via RESPIRATORY_TRACT
  Filled 2013-10-30: qty 2.5

## 2013-10-30 MED ORDER — IBUPROFEN 800 MG PO TABS
800.0000 mg | ORAL_TABLET | Freq: Once | ORAL | Status: AC
  Administered 2013-10-30: 800 mg via ORAL
  Filled 2013-10-30: qty 1

## 2013-10-30 NOTE — ED Notes (Signed)
Family at bedside. Patient states that he is not having any pain at this time. States that its hard to breath.

## 2013-10-30 NOTE — ED Provider Notes (Signed)
CSN: [id number]     Arrival date & time 10/30/13  1627 History  This chart was scribed for Lyanne Co, MD by Dorothey Baseman, ED Scribe. This patient was seen in room [facility]02/[facility]02 and the patient's care was started at 4:54 PM.    Chief Complaint  Patient presents with  . Shortness of Breath   The history is provided by the patient and the spouse. No language interpreter was used.   HPI Comments: Oscar Hickman is a 72 y.o. Male with a history of COPD, CHF, and prostate cancer who presents to the Emergency Department complaining of intermittent shortness of breath with associated, mild, dry cough and intermittent bilateral leg swelling onset yesterday that began progressively worsening today. He states that the shortness of breath is exacerbated with exertion and alleviated with sitting upright. He reports that the leg swelling is alleviated with elevation. Patient reports that he has been treated with breathing treatments in the past with relief. He reports that he is currently on an aspirin regimen. Patient also has a history of HTN, hyperlipidemia, and DM. He denies history of DVT/PE.   Past Medical History  Diagnosis Date  . Skin infection     history  . HTN (hypertension) 11/23/2011  . High cholesterol   . Diabetes mellitus   . Chronic headache     daily  . Anxiety   . Osteoarthritis   . COPD (chronic obstructive pulmonary disease)     patient denies  . Peripheral neuropathy   . 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 CHF (congestive heart failure)   . Medical non-compliance   . Heart disease    Past Surgical History  Procedure Laterality Date  . Transurethral resection of prostate  03/2008  . Cardiac catheterization  02/2009    minimal non-obstructive CAD  . Cystoscopy w/ ureteral stent placement Right 04/06/2013    Procedure: CYSTOSCOPY WITH RETROGRADE PYELOGRAM/URETERAL STENT PLACEMENT;  Surgeon: Ky Barban, MD;  Location: [facility];   Service: Urology;  Laterality: Right;  . Cystoscopy w/ ureteral stent removal Right 04/06/2013    Procedure: CYSTOSCOPY WITH STENT REMOVAL;  Surgeon: Ky Barban, MD;  Location: [facility];  Service: Urology;  Laterality: Right;   Family History  Problem Relation Age of Onset  . Cancer Mother   . Cancer Father    History  Substance Use Topics  . Smoking status: Never Smoker   . Smokeless tobacco: Never Used  . Alcohol Use: No    Review of Systems  A complete 10 system review of systems was obtained and all systems are negative except as noted in the HPI and PMH.   Allergies  Review of patient's allergies indicates no known allergies.  Home Medications   Current Outpatient Rx  Name  Route  Sig  Dispense  Refill  . abiraterone Acetate (ZYTIGA) 250 MG tablet   Oral   Take 4 tablets (1,000 mg total) by mouth daily. Take on an empty stomach 1 hour before or 2 hours after a meal   120 tablet   0   . acetaminophen (TYLENOL) 500 MG tablet   Oral   Take 500 mg by mouth every 6 (six) hours as needed.         Marland Kitchen amLODipine (NORVASC) 10 MG tablet   Oral   Take 10 mg by mouth daily.         Marland Kitchen aspirin EC 81 MG tablet   Oral  Take 81 mg by mouth daily.         . carvedilol (COREG) 25 MG tablet   Oral   Take 25 mg by mouth 2 (two) times daily with a meal.         . hydrALAZINE (APRESOLINE) 50 MG tablet   Oral   Take 50 mg by mouth 3 (three) times daily.         Marland Kitchen ibuprofen (ADVIL,MOTRIN) 200 MG tablet   Oral   Take 200 mg by mouth every 6 (six) hours as needed.         . insulin glargine (LANTUS) 100 UNIT/ML injection   Subcutaneous   Inject 25-26 Units into the skin daily.          . isosorbide dinitrate (ISORDIL) 20 MG tablet   Oral   Take 20 mg by mouth 3 (three) times daily.          . metFORMIN (GLUCOPHAGE) 500 MG tablet   Oral   Take 500 mg by mouth 2 (two) times daily with a meal.           . predniSONE (DELTASONE) 5 MG tablet   Oral    Take 1 tablet (5 mg total) by mouth 2 (two) times daily with a meal.   60 tablet   0   . quinapril (ACCUPRIL) 40 MG tablet   Oral   Take 40 mg by mouth daily.           Marland Kitchen albuterol (PROVENTIL HFA;VENTOLIN HFA) 108 (90 BASE) MCG/ACT inhaler   Inhalation   Inhale 2 puffs into the lungs every 4 (four) hours as needed for wheezing or shortness of breath.   1 Inhaler   0    Triage Vitals: BP 145/76  Pulse 79  Temp(Src) 97.7 F (36.5 C) (Oral)  Resp 16  Wt 190 lb (86.183 kg)  SpO2 94%  Physical Exam  Nursing note and vitals reviewed. Constitutional: He is oriented to person, place, and time. He appears well-developed and well-nourished.  HENT:  Head: Normocephalic and atraumatic.  Eyes: EOM are normal.  Neck: Normal range of motion.  Cardiovascular: Normal rate, regular rhythm, normal heart sounds and intact distal pulses.   Pulmonary/Chest: Effort normal. No respiratory distress. He has no wheezes. He has rales.  Rales in the bases. No rhonchi or wheezes.   Abdominal: Soft. He exhibits no distension. There is no tenderness.  Musculoskeletal: Normal range of motion. He exhibits edema.  Trace edema in the legs bilaterally.   Neurological: He is alert and oriented to person, place, and time.  Skin: Skin is warm and dry.  Psychiatric: He has a normal mood and affect. Judgment normal.    ED Course  Procedures (including critical care time)  DIAGNOSTIC STUDIES: Oxygen Saturation is 94% on room air, adequate by my interpretation.    COORDINATION OF CARE: 5:00 PM- Will order a chest x-ray and blood labs. Discussed treatment plan with patient at bedside and patient verbalized agreement.   7:29 PM- Patient reports that his breathing has somewhat improved. Discussed that x-ray and lab results were normal and do not indicate a PE. Will order a breathing treatment to manage symptoms. Discussed treatment plan with patient at bedside and patient verbalized agreement.   8:28 PM-  Patient reports feeling somewhat better after receiving the breathing treatment. Will walk the patient around to ensure his symptoms do not worsen with activity. Discussed treatment plan with patient at bedside and patient verbalized agreement.  Labs Review Labs Reviewed  CBC WITH DIFFERENTIAL - Abnormal; Notable for the following:    RBC 3.96 (*)    Hemoglobin 10.5 (*)    HCT 33.1 (*)    RDW 18.2 (*)    Neutrophils Relative % 82 (*)    Lymphocytes Relative 10 (*)    Lymphs Abs 0.6 (*)    All other components within normal limits  BASIC METABOLIC PANEL - Abnormal; Notable for the following:    Glucose, Bld 242 (*)    GFR calc non Af Amer 82 (*)    All other components within normal limits  PRO B NATRIURETIC PEPTIDE - Abnormal; Notable for the following:    Pro B Natriuretic peptide (BNP) 2771.0 (*)    All other components within normal limits  TROPONIN I  D-DIMER, QUANTITATIVE   Imaging Review Dg Chest 2 View  10/30/2013   CLINICAL DATA:  Shortness of breath.  EXAM: CHEST  2 VIEW  COMPARISON:  09/22/2013 it  FINDINGS: The cardiac silhouette is enlarged. There are no focal regions of consolidation or focal infiltrates. There is enlargement of the central pulmonary vasculature. The aorta is tortuous. Atherosclerotic calcifications identified within the aorta. The osseous structures are unremarkable.  IMPRESSION: No evidence of acute cardiopulmonary disease. There are findings which may reflect sequela of pulmonary arterial hypertension. Clinical correlation recommended. Atherosclerotic calcifications are identified.   Electronically Signed   By: Salome Holmes M.D.   On: 10/30/2013 17:26    EKG Interpretation    Date/Time:  Tuesday October 30 2013 17:50:36 EST Ventricular Rate:  79 PR Interval:  160 QRS Duration: 116 QT Interval:  428 QTC Calculation: 490 R Axis:   -76 Text Interpretation:  Normal sinus rhythm Left anterior fascicular block Prolonged QT Abnormal ECG When  compared with ECG of 22-Sep-2013 11:31, No significant change was found Confirmed by Taiylor Virden  MD, Akiba Melfi ([id number]) on 10/30/2013 6:31:41 PM            MDM   1. Dyspnea    8:53 PM Pt feels much better at this time.  Discharge him in good condition.  Feels better after albuterol.  He has no orthopnea.  There is no evidence of CHF on his chest x-ray.  No hypoxia.  Discharge him in good condition.  I personally performed the services described in this documentation, which was scribed in my presence. The recorded information has been reviewed and is accurate.      Lyanne Co, MD 10/30/13 [phone]

## 2013-10-30 NOTE — ED Notes (Signed)
Pt reports sob off and on for the last week, pt thinks he is swollen into his ab area and bil legs.  Denies any cp, no cough or fever.

## 2013-11-12 ENCOUNTER — Other Ambulatory Visit (HOSPITAL_COMMUNITY): Payer: Self-pay | Admitting: Oncology

## 2013-11-12 DIAGNOSIS — C61 Malignant neoplasm of prostate: Secondary | ICD-10-CM

## 2013-11-12 MED ORDER — ABIRATERONE ACETATE 250 MG PO TABS: 1000.0000 mg | ORAL_TABLET | Freq: Every day | ORAL | Status: DC

## 2013-11-13 ENCOUNTER — Inpatient Hospital Stay (HOSPITAL_COMMUNITY)
Admission: EM | Admit: 2013-11-13 | Discharge: 2013-11-17 | DRG: 292 | Disposition: A | Discharge status: Home Health | Payer: Medicare Other | Attending: Family Medicine | Admitting: Family Medicine

## 2013-11-13 ENCOUNTER — Emergency Department (HOSPITAL_COMMUNITY): Payer: Medicare Other

## 2013-11-13 ENCOUNTER — Encounter (HOSPITAL_COMMUNITY): Payer: Self-pay | Admitting: Emergency Medicine

## 2013-11-13 DIAGNOSIS — E7800 Pure hypercholesterolemia, unspecified: Secondary | ICD-10-CM | POA: Diagnosis present

## 2013-11-13 DIAGNOSIS — J441 Chronic obstructive pulmonary disease with (acute) exacerbation: Secondary | ICD-10-CM | POA: Diagnosis present

## 2013-11-13 DIAGNOSIS — E876 Hypokalemia: Secondary | ICD-10-CM | POA: Diagnosis present

## 2013-11-13 DIAGNOSIS — I5023 Acute on chronic systolic (congestive) heart failure: Principal | ICD-10-CM

## 2013-11-13 DIAGNOSIS — I509 Heart failure, unspecified: Secondary | ICD-10-CM | POA: Diagnosis present

## 2013-11-13 DIAGNOSIS — I428 Other cardiomyopathies: Secondary | ICD-10-CM

## 2013-11-13 DIAGNOSIS — G609 Hereditary and idiopathic neuropathy, unspecified: Secondary | ICD-10-CM | POA: Diagnosis present

## 2013-11-13 DIAGNOSIS — Z794 Long term (current) use of insulin: Secondary | ICD-10-CM

## 2013-11-13 DIAGNOSIS — Z91199 Patient's noncompliance with other medical treatment and regimen due to unspecified reason: Secondary | ICD-10-CM

## 2013-11-13 DIAGNOSIS — M1990 Unspecified osteoarthritis, unspecified site: Secondary | ICD-10-CM | POA: Diagnosis present

## 2013-11-13 DIAGNOSIS — Z8546 Personal history of malignant neoplasm of prostate: Secondary | ICD-10-CM

## 2013-11-13 DIAGNOSIS — E119 Type 2 diabetes mellitus without complications: Secondary | ICD-10-CM

## 2013-11-13 DIAGNOSIS — I10 Essential (primary) hypertension: Secondary | ICD-10-CM

## 2013-11-13 DIAGNOSIS — Z9119 Patient's noncompliance with other medical treatment and regimen: Secondary | ICD-10-CM

## 2013-11-13 DIAGNOSIS — F411 Generalized anxiety disorder: Secondary | ICD-10-CM | POA: Diagnosis present

## 2013-11-13 DIAGNOSIS — I2510 Atherosclerotic heart disease of native coronary artery without angina pectoris: Secondary | ICD-10-CM | POA: Diagnosis present

## 2013-11-13 DIAGNOSIS — R0600 Dyspnea, unspecified: Secondary | ICD-10-CM

## 2013-11-13 LAB — URINALYSIS, ROUTINE W REFLEX MICROSCOPIC
BILIRUBIN URINE: NEGATIVE
Glucose, UA: NEGATIVE mg/dL
Ketones, ur: 15 mg/dL — AB
Leukocytes, UA: NEGATIVE
NITRITE: NEGATIVE
PH: 6 (ref 5.0–8.0)
SPECIFIC GRAVITY, URINE: 1.02 (ref 1.005–1.030)
Urobilinogen, UA: 0.2 mg/dL (ref 0.0–1.0)

## 2013-11-13 LAB — BASIC METABOLIC PANEL
BUN: 16 mg/dL (ref 6–23)
CALCIUM: 9.2 mg/dL (ref 8.4–10.5)
CO2: 27 meq/L (ref 19–32)
CREATININE: 0.75 mg/dL (ref 0.50–1.35)
Chloride: 102 mEq/L (ref 96–112)
GFR calc Af Amer: >90 mL/min (ref >90)
GFR calc non Af Amer: >90 mL/min (ref >90)
Glucose, Bld: 125 mg/dL — ABNORMAL HIGH (ref 70–99)
Potassium: 3.4 mEq/L — ABNORMAL LOW (ref 3.7–5.3)
Sodium: 142 mEq/L (ref 137–147)

## 2013-11-13 LAB — CBC WITH DIFFERENTIAL/PLATELET
BASOS PCT: 1 % (ref 0–1)
Basophils Absolute: 0 10*3/uL (ref 0.0–0.1)
EOS ABS: 0.1 10*3/uL (ref 0.0–0.7)
Eosinophils Relative: 3 % (ref 0–5)
HEMATOCRIT: 34.9 % — AB (ref 39.0–52.0)
HEMOGLOBIN: 11.2 g/dL — AB (ref 13.0–17.0)
LYMPHS ABS: 0.7 10*3/uL (ref 0.7–4.0)
Lymphocytes Relative: 18 % (ref 12–46)
MCH: 26.8 pg (ref 26.0–34.0)
MCHC: 32.1 g/dL (ref 30.0–36.0)
MCV: 83.5 fL (ref 78.0–100.0)
MONO ABS: 0.5 10*3/uL (ref 0.1–1.0)
Monocytes Relative: 12 % (ref 3–12)
NEUTROS ABS: 2.7 10*3/uL (ref 1.7–7.7)
Neutrophils Relative %: 66 % (ref 43–77)
Platelets: 88 10*3/uL — ABNORMAL LOW (ref 150–400)
RBC: 4.18 MIL/uL — ABNORMAL LOW (ref 4.22–5.81)
RDW: 18.1 % — ABNORMAL HIGH (ref 11.5–15.5)
WBC: 4 10*3/uL (ref 4.0–10.5)

## 2013-11-13 LAB — PRO B NATRIURETIC PEPTIDE: Pro B Natriuretic peptide (BNP): 3238 pg/mL — ABNORMAL HIGH (ref 0–125)

## 2013-11-13 LAB — URINE MICROSCOPIC-ADD ON

## 2013-11-13 LAB — TROPONIN I: Troponin I: <0.3 ng/mL (ref <0.30)

## 2013-11-13 MED ORDER — NITROGLYCERIN 2 % TD OINT
1.0000 [in_us] | TOPICAL_OINTMENT | Freq: Once | TRANSDERMAL | Status: AC
  Administered 2013-11-13: 1 [in_us] via TOPICAL
  Filled 2013-11-13: qty 1

## 2013-11-13 MED ORDER — ALBUTEROL SULFATE (2.5 MG/3ML) 0.083% IN NEBU
2.5000 mg | INHALATION_SOLUTION | Freq: Once | RESPIRATORY_TRACT | Status: AC
Start: 2013-11-13 — End: 2013-11-13
  Administered 2013-11-13: 2.5 mg via RESPIRATORY_TRACT
  Filled 2013-11-13: qty 3

## 2013-11-13 MED ORDER — FUROSEMIDE 10 MG/ML IJ SOLN: 40.0000 mg | Freq: Once | INTRAMUSCULAR | Status: DC

## 2013-11-13 MED ORDER — ASPIRIN EC 81 MG PO TBEC
81.0000 mg | DELAYED_RELEASE_TABLET | Freq: Every day | ORAL | Status: DC
  Administered 2013-11-14 – 2013-11-17 (×4): 81 mg via ORAL
  Filled 2013-11-13 (×6): qty 1

## 2013-11-13 MED ORDER — FUROSEMIDE 10 MG/ML IJ SOLN
20.0000 mg | Freq: Once | INTRAMUSCULAR | Status: AC
  Administered 2013-11-13: 20 mg via INTRAVENOUS
  Filled 2013-11-13: qty 2

## 2013-11-13 MED ORDER — INSULIN GLARGINE 100 UNIT/ML ~~LOC~~ SOLN
25.0000 [IU] | Freq: Every day | SUBCUTANEOUS | Status: DC
  Administered 2013-11-13 – 2013-11-17 (×5): 25 [IU] via SUBCUTANEOUS
  Filled 2013-11-13 (×7): qty 0.25

## 2013-11-13 MED ORDER — ALBUTEROL (5 MG/ML) CONTINUOUS INHALATION SOLN: 5.0000 mg/h | INHALATION_SOLUTION | Freq: Once | RESPIRATORY_TRACT | Status: DC

## 2013-11-13 MED ORDER — HYDRALAZINE HCL 25 MG PO TABS
ORAL_TABLET | ORAL | Status: AC
  Filled 2013-11-13: qty 2

## 2013-11-13 MED ORDER — NITROGLYCERIN 2 % TD OINT: 1.0000 [in_us] | TOPICAL_OINTMENT | Freq: Once | TRANSDERMAL | Status: DC

## 2013-11-13 MED ORDER — AMLODIPINE BESYLATE 5 MG PO TABS
ORAL_TABLET | ORAL | Status: AC
  Filled 2013-11-13: qty 2

## 2013-11-13 MED ORDER — PREDNISONE 10 MG PO TABS
5.0000 mg | ORAL_TABLET | Freq: Two times a day (BID) | ORAL | Status: DC
  Administered 2013-11-14 (×2): 5 mg via ORAL
  Administered 2013-11-15: 08:00:00 via ORAL
  Administered 2013-11-15 – 2013-11-17 (×4): 5 mg via ORAL
  Filled 2013-11-13 (×7): qty 1

## 2013-11-13 MED ORDER — AMLODIPINE BESYLATE 5 MG PO TABS
10.0000 mg | ORAL_TABLET | Freq: Every day | ORAL | Status: DC
  Administered 2013-11-13 – 2013-11-17 (×5): 10 mg via ORAL
  Filled 2013-11-13 (×4): qty 2

## 2013-11-13 MED ORDER — ENOXAPARIN SODIUM 40 MG/0.4ML ~~LOC~~ SOLN
40.0000 mg | SUBCUTANEOUS | Status: DC
  Administered 2013-11-13 – 2013-11-16 (×4): 40 mg via SUBCUTANEOUS
  Filled 2013-11-13 (×4): qty 0.4

## 2013-11-13 MED ORDER — METFORMIN HCL 500 MG PO TABS
500.0000 mg | ORAL_TABLET | Freq: Two times a day (BID) | ORAL | Status: DC
  Administered 2013-11-14 – 2013-11-17 (×7): 500 mg via ORAL
  Filled 2013-11-13 (×7): qty 1

## 2013-11-13 MED ORDER — POTASSIUM CHLORIDE CRYS ER 20 MEQ PO TBCR
40.0000 meq | EXTENDED_RELEASE_TABLET | Freq: Every day | ORAL | Status: DC
  Administered 2013-11-14: 40 meq via ORAL
  Filled 2013-11-13: qty 2

## 2013-11-13 MED ORDER — LISINOPRIL 10 MG PO TABS
40.0000 mg | ORAL_TABLET | Freq: Every day | ORAL | Status: DC
  Administered 2013-11-14 – 2013-11-17 (×4): 40 mg via ORAL
  Filled 2013-11-13 (×4): qty 4

## 2013-11-13 MED ORDER — HYDRALAZINE HCL 25 MG PO TABS
50.0000 mg | ORAL_TABLET | Freq: Three times a day (TID) | ORAL | Status: DC
  Administered 2013-11-13 – 2013-11-17 (×11): 50 mg via ORAL
  Filled 2013-11-13 (×15): qty 2

## 2013-11-13 MED ORDER — ALBUTEROL SULFATE (2.5 MG/3ML) 0.083% IN NEBU
2.5000 mg | INHALATION_SOLUTION | Freq: Once | RESPIRATORY_TRACT | Status: AC
  Administered 2013-11-13: 2.5 mg via RESPIRATORY_TRACT
  Filled 2013-11-13: qty 3

## 2013-11-13 MED ORDER — ABIRATERONE ACETATE 250 MG PO TABS
1000.0000 mg | ORAL_TABLET | Freq: Every day | ORAL | Status: DC
  Administered 2013-11-17: 1000 mg via ORAL
  Filled 2013-11-13 (×3): qty 4

## 2013-11-13 MED ORDER — QUINAPRIL HCL 10 MG PO TABS: 40.0000 mg | ORAL_TABLET | Freq: Every day | ORAL | Status: DC

## 2013-11-13 MED ORDER — CARVEDILOL 12.5 MG PO TABS
25.0000 mg | ORAL_TABLET | Freq: Two times a day (BID) | ORAL | Status: DC
  Administered 2013-11-14 – 2013-11-17 (×7): 25 mg via ORAL
  Filled 2013-11-13: qty 1
  Filled 2013-11-13 (×4): qty 2
  Filled 2013-11-13 (×2): qty 1
  Filled 2013-11-13 (×3): qty 2

## 2013-11-13 NOTE — ED Notes (Signed)
Pt used urinal in triage exam room.

## 2013-11-13 NOTE — ED Notes (Signed)
Dr.fanta on line and stated not to give lasix40mg  ivp.

## 2013-11-13 NOTE — ED Notes (Signed)
Pt reports being sob all the time, but his "breathing is worse today" and feet/legs more swollen than normal

## 2013-11-14 LAB — GLUCOSE, CAPILLARY
Glucose-Capillary: 143 mg/dL — ABNORMAL HIGH (ref 70–99)
Glucose-Capillary: 135 mg/dL — ABNORMAL HIGH (ref 70–99)
Glucose-Capillary: 62 mg/dL — ABNORMAL LOW (ref 70–99)
Glucose-Capillary: 197 mg/dL — ABNORMAL HIGH (ref 70–99)
Glucose-Capillary: 328 mg/dL — ABNORMAL HIGH (ref 70–99)

## 2013-11-14 LAB — HEPATIC FUNCTION PANEL
ALBUMIN: 3.3 g/dL — AB (ref 3.5–5.2)
ALT: 16 U/L (ref 0–53)
AST: 19 U/L (ref 0–37)
Alkaline Phosphatase: 94 U/L (ref 39–117)
Bilirubin, Direct: 0.2 mg/dL (ref 0.0–0.3)
Indirect Bilirubin: 0.3 mg/dL (ref 0.3–0.9)
Total Bilirubin: 0.5 mg/dL (ref 0.3–1.2)
Total Protein: 6.6 g/dL (ref 6.0–8.3)

## 2013-11-14 LAB — BASIC METABOLIC PANEL
BUN: 11 mg/dL (ref 6–23)
CALCIUM: 9.3 mg/dL (ref 8.4–10.5)
CO2: 34 mEq/L — ABNORMAL HIGH (ref 19–32)
Chloride: 99 mEq/L (ref 96–112)
Creatinine, Ser: 0.75 mg/dL (ref 0.50–1.35)
GLUCOSE: 44 mg/dL — AB (ref 70–99)
Potassium: 2.5 mEq/L — CL (ref 3.7–5.3)
Sodium: 145 mEq/L (ref 137–147)

## 2013-11-14 LAB — CBC
HEMATOCRIT: 36.7 % — AB (ref 39.0–52.0)
HEMOGLOBIN: 11.7 g/dL — AB (ref 13.0–17.0)
MCH: 26.7 pg (ref 26.0–34.0)
MCHC: 31.9 g/dL (ref 30.0–36.0)
MCV: 83.8 fL (ref 78.0–100.0)
Platelets: 324 10*3/uL (ref 150–400)
RBC: 4.38 MIL/uL (ref 4.22–5.81)
RDW: 18.2 % — AB (ref 11.5–15.5)
WBC: 7 10*3/uL (ref 4.0–10.5)

## 2013-11-14 LAB — MAGNESIUM: Magnesium: 1.6 mg/dL (ref 1.5–2.5)

## 2013-11-14 MED ORDER — LORAZEPAM 0.5 MG PO TABS
0.5000 mg | ORAL_TABLET | Freq: Four times a day (QID) | ORAL | Status: DC | PRN
  Administered 2013-11-17: 0.5 mg via ORAL
  Filled 2013-11-14: qty 1

## 2013-11-14 MED ORDER — POTASSIUM CHLORIDE CRYS ER 20 MEQ PO TBCR
20.0000 meq | EXTENDED_RELEASE_TABLET | Freq: Every day | ORAL | Status: DC
  Administered 2013-11-15 – 2013-11-17 (×3): 20 meq via ORAL
  Filled 2013-11-14 (×3): qty 1

## 2013-11-14 MED ORDER — IPRATROPIUM-ALBUTEROL 0.5-2.5 (3) MG/3ML IN SOLN
3.0000 mL | RESPIRATORY_TRACT | Status: DC
  Administered 2013-11-14 – 2013-11-17 (×15): 3 mL via RESPIRATORY_TRACT
  Filled 2013-11-14 (×17): qty 3

## 2013-11-14 MED ORDER — FUROSEMIDE 20 MG PO TABS
20.0000 mg | ORAL_TABLET | Freq: Two times a day (BID) | ORAL | Status: DC
  Administered 2013-11-14 – 2013-11-16 (×4): 20 mg via ORAL
  Filled 2013-11-14 (×4): qty 1

## 2013-11-14 MED ORDER — GUAIFENESIN ER 600 MG PO TB12
600.0000 mg | ORAL_TABLET | Freq: Two times a day (BID) | ORAL | Status: DC
  Administered 2013-11-14 – 2013-11-17 (×7): 600 mg via ORAL
  Filled 2013-11-14 (×7): qty 1

## 2013-11-14 MED ORDER — ISOSORBIDE MONONITRATE ER 60 MG PO TB24
30.0000 mg | ORAL_TABLET | Freq: Every day | ORAL | Status: DC
  Administered 2013-11-14: 30 mg via ORAL
  Administered 2013-11-15: 08:00:00 via ORAL
  Administered 2013-11-16 – 2013-11-17 (×2): 30 mg via ORAL
  Filled 2013-11-14 (×4): qty 1

## 2013-11-14 MED ORDER — POTASSIUM CHLORIDE 10 MEQ/100ML IV SOLN
10.0000 meq | INTRAVENOUS | Status: AC
  Administered 2013-11-14 (×4): 10 meq via INTRAVENOUS
  Filled 2013-11-14 (×3): qty 100

## 2013-11-14 MED ORDER — METHYLPREDNISOLONE SODIUM SUCC 125 MG IJ SOLR
125.0000 mg | Freq: Three times a day (TID) | INTRAMUSCULAR | Status: DC
  Administered 2013-11-14 – 2013-11-15 (×4): 125 mg via INTRAVENOUS
  Filled 2013-11-14 (×4): qty 2

## 2013-11-14 MED ORDER — TAMSULOSIN HCL 0.4 MG PO CAPS
0.4000 mg | ORAL_CAPSULE | Freq: Every day | ORAL | Status: DC
Start: 2013-11-14 — End: 2013-11-17
  Administered 2013-11-14 – 2013-11-17 (×4): 0.4 mg via ORAL
  Filled 2013-11-14 (×4): qty 1

## 2013-11-14 NOTE — Progress Notes (Signed)
Inpatient Diabetes Program Recommendations  AACE/ADA: New Consensus Statement on Inpatient Glycemic Control (2013)  Target Ranges:  Prepandial:   less than 140 mg/dL      Peak postprandial:   less than 180 mg/dL (1-2 hours)      Critically ill patients:  140 - 180 mg/dL   Results for Oscar Hickman, Oscar Hickman (MRN [id number]) as of 11/14/2013 13:03  Ref. Range 11/13/2013 16:46 11/14/2013 05:30  Glucose Latest Range: 70-99 mg/dL 125 (H) 44 (LL)  Results for Oscar Hickman, Oscar Hickman (MRN [id number]) as of 11/14/2013 13:03  Ref. Range 11/14/2013 08:56  Glucose-Capillary Latest Range: 70-99 mg/dL 143 (H)    Inpatient Diabetes Program Recommendations Insulin - Basal: Please consider decreasing Lantus to 12 units daily and adjust accordingly. Correction (SSI): While inpatient, please order CBGs with Novolog correction ACHS. HgbA1C: Please consider ordering an A1C to evaluate glycemic control over the past 2-3 months.  Note: In reviewing the chart, noted patient received Lantus 25 units on 11/13/13 @ 23:04 and blood glucose at 5:30 am on labs was 44 mg/dl.  Patient was given Lantus 25 units again this morning at 10:39 (which is less than 12 hours from previous dose of Lantus).  Anticipate blood glucose to be low over the next 12 hours due to receiving Lantus doses so closely together.  According to the home medication list, patient takes Lantus 25-26 units daily and Metformin 500 mg BID as an outpatient for diabetes control.  Talked with Bevely Palmer, RN (bedside RN) to make her aware that patient received Lantus doses within 12 hours of each other and patient is at high risk of becoming hypoglycemic.  Please consider decreasing Lantus to half of home dose, order CBGs with Novolog correction ACHS, and order an A1C.  Will continue to follow.  Thanks, Barnie Alderman, RN, MSN, CCRN Diabetes Coordinator Inpatient Diabetes Program [phone] (Team Pager) [phone] ([facility] office) [phone] [facility] office)

## 2013-11-14 NOTE — Progress Notes (Signed)
Pharmacy Consult: Lovenox for VTE prophylaxis.  Patient Measurements: Height: 5\' 7"  (170.2 cm) Weight: 190 lb 4.1 oz (86.3 kg) IBW/kg (Calculated) : 66.1 Body mass index is 29.79 kg/(m^2).   VITALS Filed Vitals:   11/14/13 0617  BP: 178/92  Pulse: 93  Temp: 98.9 F (37.2 C)  Resp: 24    INR Last Three Days: No results found for this basename: INR,  in the last 72 hours  CBC:    Component Value Date/Time   WBC 7.0 11/14/2013 0530   RBC 4.38 11/14/2013 0530   RBC 3.67* 04/06/2013 1357   HGB 11.7* 11/14/2013 0530   HCT 36.7* 11/14/2013 0530   PLT 324 11/14/2013 0530   MCV 83.8 11/14/2013 0530   MCH 26.7 11/14/2013 0530   MCHC 31.9 11/14/2013 0530   RDW 18.2* 11/14/2013 0530   LYMPHSABS 0.7 11/13/2013 1646   MONOABS 0.5 11/13/2013 1646   EOSABS 0.1 11/13/2013 1646   BASOSABS 0.0 11/13/2013 1646    RENAL FUNCTION: Estimated Creatinine Clearance: 88.9 ml/min (by C-G formula based on Cr of 0.75).  Assessment: Dose stable for age, weight, renal function and indication.  Plan: Sign off.  Pricilla Larsson, [facility] 11/14/2013 8:21 AM

## 2013-11-14 NOTE — H&P (Signed)
[id number] 

## 2013-11-14 NOTE — ED Provider Notes (Signed)
CSN: [id number]     Arrival date & time 11/13/13  1344 History   First MD Initiated Contact with Patient 11/13/13 2047     Chief Complaint  Patient presents with  . Shortness of Breath   (Consider location/radiation/quality/duration/timing/severity/associated sxs/prior Treatment) HPI Comments: 73 yo male with cardiomyopathy, prostate CA, lipids, HTN presents with sob.  Gradually worsening sob worse with exertion and lying flat the past 3 days.  Pt had appt tomorrow but feels too unwell to wait that long.  No O2 at home.  Unsure weight gain.  Mild leg swelling.  No recent surgery or PE hx.    Patient is a 73 y.o. male presenting with shortness of breath. The history is provided by the patient.  Shortness of Breath Associated symptoms: cough   Associated symptoms: no abdominal pain, no chest pain, no fever, no headaches, no neck pain, no rash and no vomiting     Past Medical History  Diagnosis Date  . Skin infection     history  . HTN (hypertension) 11/23/2011  . High cholesterol   . Diabetes mellitus   . Chronic headache     daily  . Anxiety   . Osteoarthritis   . COPD (chronic obstructive pulmonary disease)     patient denies  . Peripheral neuropathy   . 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 CHF (congestive heart failure)   . Medical non-compliance   . Heart disease    Past Surgical History  Procedure Laterality Date  . Transurethral resection of prostate  03/2008  . Cardiac catheterization  02/2009    minimal non-obstructive CAD  . Cystoscopy w/ ureteral stent placement Right 04/06/2013    Procedure: CYSTOSCOPY WITH RETROGRADE PYELOGRAM/URETERAL STENT PLACEMENT;  Surgeon: Marissa Nestle, MD;  Location: [facility];  Service: Urology;  Laterality: Right;  . Cystoscopy w/ ureteral stent removal Right 04/06/2013    Procedure: CYSTOSCOPY WITH STENT REMOVAL;  Surgeon: Marissa Nestle, MD;  Location: [facility];  Service: Urology;  Laterality: Right;    Family History  Problem Relation Age of Onset  . Cancer Mother   . Cancer Father    History  Substance Use Topics  . Smoking status: Never Smoker   . Smokeless tobacco: Never Used  . Alcohol Use: No    Review of Systems  Constitutional: Positive for appetite change and fatigue. Negative for fever and chills.  HENT: Negative for congestion.   Eyes: Negative for visual disturbance.  Respiratory: Positive for cough and shortness of breath.   Cardiovascular: Positive for leg swelling. Negative for chest pain.  Gastrointestinal: Negative for vomiting and abdominal pain.  Genitourinary: Negative for dysuria and flank pain.  Musculoskeletal: Positive for back pain. Negative for neck pain and neck stiffness.  Skin: Negative for rash.  Neurological: Negative for light-headedness and headaches.    Allergies  Review of patient's allergies indicates no known allergies.  Home Medications  No current outpatient prescriptions on file. BP 178/92  Pulse 93  Temp(Src) 98.9 F (37.2 C) (Oral)  Resp 24  Ht 5\' 7"  (1.702 m)  Wt 190 lb 4.1 oz (86.3 kg)  BMI 29.79 kg/m2  SpO2 96% Physical Exam  Nursing note and vitals reviewed. Constitutional: He is oriented to person, place, and time. He appears well-developed and well-nourished.  HENT:  Head: Normocephalic and atraumatic.  Eyes: Right eye exhibits no discharge. Left eye exhibits no discharge.  Neck: Normal range of motion. Neck supple. JVD (mild)  present. No tracheal deviation present.  Cardiovascular: Normal rate and regular rhythm.   Pulmonary/Chest: Effort normal. He has rales (at bases bilateral crackles).  Abdominal: Soft. He exhibits no distension. There is no tenderness. There is no guarding.  Musculoskeletal: He exhibits edema (bilteral LE mild).  Neurological: He is alert and oriented to person, place, and time.  Skin: Skin is warm. No rash noted.  Psychiatric: He has a normal mood and affect.    ED Course  Procedures  (including critical care time) Labs Review Labs Reviewed  CBC WITH DIFFERENTIAL - Abnormal; Notable for the following:    RBC 4.18 (*)    Hemoglobin 11.2 (*)    HCT 34.9 (*)    RDW 18.1 (*)    Platelets 88 (*)    All other components within normal limits  BASIC METABOLIC PANEL - Abnormal; Notable for the following:    Potassium 3.4 (*)    Glucose, Bld 125 (*)    All other components within normal limits  PRO B NATRIURETIC PEPTIDE - Abnormal; Notable for the following:    Pro B Natriuretic peptide (BNP) 3238.0 (*)    All other components within normal limits  URINALYSIS, ROUTINE W REFLEX MICROSCOPIC - Abnormal; Notable for the following:    Hgb urine dipstick LARGE (*)    Ketones, ur 15 (*)    Protein, ur TRACE (*)    All other components within normal limits  URINE MICROSCOPIC-ADD ON - Abnormal; Notable for the following:    Squamous Epithelial / LPF FEW (*)    All other components within normal limits  CBC - Abnormal; Notable for the following:    Hemoglobin 11.7 (*)    HCT 36.7 (*)    RDW 18.2 (*)    All other components within normal limits  BASIC METABOLIC PANEL - Abnormal; Notable for the following:    Potassium 2.5 (*)    CO2 34 (*)    Glucose, Bld 44 (*)    All other components within normal limits  TROPONIN I   Imaging Review Dg Chest 2 View  11/13/2013   CLINICAL DATA:  Shortness of breath, hypertension, COPD, CHF, diabetes, smoker, prostate cancer  EXAM: CHEST  2 VIEW  COMPARISON:  10/30/2013  FINDINGS: Enlargement of cardiac silhouette with pulmonary vascular congestion.  Calcified tortuous aorta.  Persistent prominence of the central pulmonary arteries which may reflect pulmonary arterial hypertension.  Lungs clear.  No pleural effusion or pneumothorax.  Bones demineralized.  IMPRESSION: Enlargement of cardiac silhouette with pulmonary venous hypertension and question pulmonary arterial hypertension.  No definite acute infiltrate.   Electronically Signed   By: Lavonia Dana M.D.   On: 11/13/2013 15:48    EKG Interpretation    Date/Time:    Ventricular Rate:    PR Interval:    QRS Duration:   QT Interval:    QTC Calculation:   R Axis:     Text Interpretation:              MDM   1. Acute on chronic systolic CHF (congestive heart failure)   2. Dyspnea   Hypokalemia HTN  Clinically CHF exac and htn. Nitro and lasix given. Spoke with on call PCP who agreed with admission. Pt not requiring O2 if not exerting himself in ED.   The patients results and plan were reviewed and discussed.   Any x-rays performed were personally reviewed by myself.   Differential diagnosis were considered with the presenting HPI.  Diagnosis:  above  Admission/ observation were discussed with the admitting physician, patient and/or family and they are comfortable with the plan.    Mariea Clonts, MD 11/14/13 0900

## 2013-11-14 NOTE — Care Management Note (Signed)
    Page 1 of 1   11/14/2013     1:07:02 PM   CARE MANAGEMENT NOTE 11/14/2013  Patient:  Oscar Hickman, Oscar Hickman   Account Number:  [id number]  Date Initiated:  11/14/2013  Documentation initiated by:  Theophilus Kinds  Subjective/Objective Assessment:   Pt admitted from home with CHF. Pt lives with his wife and will return home at discharge. Pt is fairly independent with ADL's. Pt would like [place] RN and will need to assess for home O2.     Action/Plan:   Will continue to follow for discharge planning needs.   Anticipated DC Date:  11/19/2013   Anticipated DC Plan:  [place]  CM consult      Choice offered to / List presented to:             Status of service:  Completed, signed off Medicare Important Message given?   (If response is "NO", the following Medicare IM given date fields will be blank) Date Medicare IM given:   Date Additional Medicare IM given:    Discharge Disposition:  [place]  Per UR Regulation:    If discussed at Long Length of Stay Meetings, dates discussed:    Comments:  11/14/13 [place], RN BSN CM

## 2013-11-15 LAB — TSH: TSH: 0.177 u[IU]/mL — ABNORMAL LOW (ref 0.350–4.500)

## 2013-11-15 LAB — GLUCOSE, CAPILLARY
GLUCOSE-CAPILLARY: 213 mg/dL — AB (ref 70–99)
GLUCOSE-CAPILLARY: 267 mg/dL — AB (ref 70–99)
GLUCOSE-CAPILLARY: 304 mg/dL — AB (ref 70–99)
GLUCOSE-CAPILLARY: 322 mg/dL — AB (ref 70–99)
Glucose-Capillary: 213 mg/dL — ABNORMAL HIGH (ref 70–99)
Glucose-Capillary: 274 mg/dL — ABNORMAL HIGH (ref 70–99)

## 2013-11-15 LAB — BASIC METABOLIC PANEL
BUN: 18 mg/dL (ref 6–23)
CO2: 32 meq/L (ref 19–32)
CREATININE: 0.97 mg/dL (ref 0.50–1.35)
Calcium: 9.3 mg/dL (ref 8.4–10.5)
Chloride: 101 mEq/L (ref 96–112)
GFR calc Af Amer: >90 mL/min (ref >90)
GFR calc non Af Amer: 81 mL/min — ABNORMAL LOW (ref >90)
GLUCOSE: 231 mg/dL — AB (ref 70–99)
Potassium: 4 mEq/L (ref 3.7–5.3)
Sodium: 143 mEq/L (ref 137–147)

## 2013-11-15 MED ORDER — METHYLPREDNISOLONE SODIUM SUCC 125 MG IJ SOLR
80.0000 mg | Freq: Three times a day (TID) | INTRAMUSCULAR | Status: DC
  Administered 2013-11-15 – 2013-11-16 (×2): 80 mg via INTRAVENOUS
  Filled 2013-11-15 (×2): qty 2

## 2013-11-15 NOTE — Progress Notes (Signed)
Inpatient Diabetes Program Recommendations  AACE/ADA: New Consensus Statement on Inpatient Glycemic Control (2013)  Target Ranges:  Prepandial:   less than 140 mg/dL      Peak postprandial:   less than 180 mg/dL (1-2 hours)      Critically ill patients:  140 - 180 mg/dL   Results for XAVYER, STEENSON (MRN [id number]) as of 11/15/2013 12:36  Ref. Range 11/14/2013 13:34 11/14/2013 16:50 11/14/2013 18:48 11/14/2013 19:46 11/14/2013 23:53 11/15/2013 04:44 11/15/2013 07:34 11/15/2013 11:52  Glucose-Capillary Latest Range: 70-99 mg/dL 135 (H) 62 (L) 197 (H) 328 (H) 267 (H) 213 (H) 213 (H) 304 (H)   Inpatient Diabetes Program Recommendations Correction (SSI): Please order CBGs with Novolog correction ACHS. HgbA1C: Please consider ordering an A1C to evaluate glycemic control over the past 2-3 months.  Note: Noted hypoglycemia with blood glucose of 62 mg/dl on 11/14/13 at 16:50.  Also noted patient was started on Solumedrol 125 mg Q8H in addition to ordered Prednisone 5mg  QAM which is contributing to hyperglycemia.  Blood glucose over the past 18 hours has ranged from 213-328 mg/dl.  Please order CBGs with Novolog correction ACHS and an A1C.  Will continue to follow.  Thanks, Barnie Alderman, RN, MSN, CCRN Diabetes Coordinator Inpatient Diabetes Program [phone] (Team Pager) [phone] ([facility] office) [phone] [facility] office)

## 2013-11-15 NOTE — Progress Notes (Signed)
[id number] 

## 2013-11-15 NOTE — Progress Notes (Signed)
UR chart review completed.  

## 2013-11-15 NOTE — H&P (Signed)
NAME:  Oscar Hickman, Oscar Hickman NO.:  [id number]  MEDICAL RECORD NO.:  [id number]  LOCATION:  D357                          FACILITY:  [facility]  PHYSICIAN:  Unk Lightning, MDDATE OF BIRTH:  05-12-1941  DATE OF ADMISSION:  11/13/2013 DATE OF DISCHARGE:  LH                             HISTORY & PHYSICAL   HISTORY OF PRESENT ILLNESS:  A 73 year old black male with non-insulin- dependent diabetes, hypertension, and prostate carcinoma, who is notorious for chronic noncompliance, complains of 3-day history of increasing dyspnea, wheezing and presents with an exacerbation of COPD and/or possible CHF.  He has an ejection fraction of 45-50% based on echo 6 months ago with an nonischemic cardiomyopathy, nonobstructive coronary artery disease.  His troponins are negative.  The patient is hypertensive with a systolic of 017/79, and will aggressively pursue blood pressure reduction and diuresis as well as nebulizer therapy for COPD component, and we will monitor renal function carefully.  PAST MEDICAL HISTORY:  Significant for: 1. Hypertension. 2. Insulin-dependent diabetes. 3. Anxiety specifically at medical facilities. 4. Osteoarthritis. 5. COPD. 6. Peripheral neuropathy. 7. Prostate carcinoma with resection and radiation therapy. 8. Nonischemic cardiomyopathy. 9. Chronic medical noncompliance.  PAST SURGICAL HISTORY:  Remarkable for TURP in 2009, cardiac cath, which reveals minimal nonobstructive CAD, cystoscopy with stent placement in 2014.  SOCIAL HISTORY:  The patient currently does not smoke.  Lives at home with his wife but did smoke for 35 years packs per day average.  He does not imbibe alcohol.  CURRENT MEDICINES:  Unknown at present.  We will look them up in the office chart.  I do know he takes insulin and several blood pressure medicines as well as Flomax.  REVIEW OF SYSTEMS:  Negative for seizures, tremors, polyuria, polydipsia, nausea, vomiting,  hematemesis, melena, hematochezia.  PHYSICAL EXAMINATION:  VITAL SIGNS:  Blood pressure at present is 178/92, temperature 98.9, pulse is 93 and regular, respiratory rate is 24, currently in sinus rhythm, rate of about 100 beats per minute. HEENT:  Eyes, PERRLA and intact.  Sclerae clear.  Conjunctivae pink. NECK:  No JVD.  No carotid bruits.  No thyromegaly.  No thyroid bruits. LUNGS:  Accessory use of muscles of respiration and sternocleidomastoids.  Lungs show minimal diminished breath sounds in the bases.  Prolonged inspiratory and expiratory phase.  Scattered rhonchi and mild expiratory wheeze. HEART:  Regular rhythm.  No S3, S4.  No heaves, thrills, or rubs. ABDOMEN:  Soft, nontender.  Bowel sounds normoactive.  No guarding, rebound, mass, or megaly. EXTREMITIES:  Trace to 1+ pedal edema. NEUROLOGIC:  Cranial nerves II-XII grossly intact.  The patient moves all 4 extremities.  Plantars are downgoing.  IMAGING:  Chest x-ray reveals no definite acute infiltrate, just some cardiomegaly with pulmonary venous hypertension.  PLAN:  To admit, place on Norvasc.  Continue his carvedilol, hydralazine, Lantus 25 units daily.  Monitor renal function, monitor electrolytes.  We will give DuoNeb nebulizer q.4 h. p.r.n., and perhaps intravenous Solu-Medrol depending on clinical course.     Unk Lightning, MD     RMD/MEDQ  D:  11/14/2013  T:  11/15/2013  Job:  [id number]

## 2013-11-16 LAB — BASIC METABOLIC PANEL
BUN: 36 mg/dL — ABNORMAL HIGH (ref 6–23)
CO2: 30 meq/L (ref 19–32)
Calcium: 9 mg/dL (ref 8.4–10.5)
Chloride: 101 mEq/L (ref 96–112)
Creatinine, Ser: 1.02 mg/dL (ref 0.50–1.35)
GFR calc Af Amer: 83 mL/min — ABNORMAL LOW (ref >90)
GFR, EST NON AFRICAN AMERICAN: 72 mL/min — AB (ref >90)
Glucose, Bld: 206 mg/dL — ABNORMAL HIGH (ref 70–99)
POTASSIUM: 4.2 meq/L (ref 3.7–5.3)
Sodium: 141 mEq/L (ref 137–147)

## 2013-11-16 LAB — GLUCOSE, CAPILLARY
GLUCOSE-CAPILLARY: 198 mg/dL — AB (ref 70–99)
GLUCOSE-CAPILLARY: 283 mg/dL — AB (ref 70–99)
GLUCOSE-CAPILLARY: 249 mg/dL — AB (ref 70–99)
GLUCOSE-CAPILLARY: 206 mg/dL — AB (ref 70–99)
Glucose-Capillary: 187 mg/dL — ABNORMAL HIGH (ref 70–99)
Glucose-Capillary: 159 mg/dL — ABNORMAL HIGH (ref 70–99)
Glucose-Capillary: 222 mg/dL — ABNORMAL HIGH (ref 70–99)

## 2013-11-16 MED ORDER — PREDNISONE 20 MG PO TABS
20.0000 mg | ORAL_TABLET | Freq: Two times a day (BID) | ORAL | Status: DC
  Administered 2013-11-16 – 2013-11-17 (×2): 20 mg via ORAL
  Filled 2013-11-16 (×2): qty 1

## 2013-11-16 NOTE — Progress Notes (Signed)
[id number] 

## 2013-11-16 NOTE — Progress Notes (Signed)
NAMEDARELL, SAPUTO NO.:  [id number]  MEDICAL RECORD NO.:  [id number]  LOCATION:  K998                          FACILITY:  [facility]  PHYSICIAN:  Unk Lightning, MDDATE OF BIRTH:  03/13/1941  DATE OF PROCEDURE: DATE OF DISCHARGE:                                PROGRESS NOTE   The patient has COPD/accelerated hypertension, CHF exacerbation.  No infiltrate per chest x-ray, some evidence of pulmonary arterial and venous hypertension.  Blood pressure was 180, 24 hours ago with aggressive therapy now down to a systolic of 338, less dyspnea.  Patient was also placed on DuoNeb nebulizer as well as Solu-Medrol 125 IV.  PHYSICAL EXAMINATION:  VITAL SIGNS:  Afebrile, pulse 79, sinus rhythm, 131/77, O2 sat 94% on room air. LUNGS:  Show minimal inspiratory and expiratory wheeze much improved. No rales, no rhonchi. HEART:  Regular rhythm.  No S3, S4.  No heaves, thrills, or rubs. ABDOMEN:  Soft, nontender.  Bowel sounds normoactive.  Creatinine 0.97, potassium 4.0, hemoglobin 11.7.  PLAN:  Right now is to decrease Solu-Medrol to 80 IV q.8 h.  Continue DuoNeb.  Continue long-acting insulin with fair glycemic control and monitor renal function and electrolytes.     Unk Lightning, MD     RMD/MEDQ  D:  11/15/2013  T:  11/16/2013  Job:  [id number]

## 2013-11-16 NOTE — Progress Notes (Signed)
Blood sugar reported to Dr Cindie Laroche

## 2013-11-16 NOTE — Progress Notes (Signed)
Inpatient Diabetes Program Recommendations  AACE/ADA: New Consensus Statement on Inpatient Glycemic Control (2013)  Target Ranges:  Prepandial:   less than 140 mg/dL      Peak postprandial:   less than 180 mg/dL (1-2 hours)      Critically ill patients:  140 - 180 mg/dL   Results for SPYROS, WINCH (MRN [id number]) as of 11/16/2013 11:14  Ref. Range 11/15/2013 11:52 11/15/2013 16:38 11/15/2013 20:07 11/16/2013 01:01 11/16/2013 04:00 11/16/2013 08:05  Glucose-Capillary Latest Range: 70-99 mg/dL 304 (H) 274 (H) 322 (H) 198 (H) 187 (H) 159 (H)   Inpatient Diabetes Program Recommendations Correction (SSI): While inpatient, please order CBGs with Novolog correction ACHS. HgbA1C: Please consider ordering an A1C to evaluate glycemic control over the past 2-3 months.  Thanks, Barnie Alderman, RN, MSN, CCRN Diabetes Coordinator Inpatient Diabetes Program [phone] (Team Pager) [phone] ([facility] office) [phone] [facility] office)

## 2013-11-17 LAB — GLUCOSE, CAPILLARY
GLUCOSE-CAPILLARY: 116 mg/dL — AB (ref 70–99)
Glucose-Capillary: 165 mg/dL — ABNORMAL HIGH (ref 70–99)
Glucose-Capillary: 154 mg/dL — ABNORMAL HIGH (ref 70–99)
Glucose-Capillary: 115 mg/dL — ABNORMAL HIGH (ref 70–99)

## 2013-11-17 LAB — BASIC METABOLIC PANEL
BUN: 29 mg/dL — ABNORMAL HIGH (ref 6–23)
CALCIUM: 9 mg/dL (ref 8.4–10.5)
CO2: 31 mEq/L (ref 19–32)
CREATININE: 0.85 mg/dL (ref 0.50–1.35)
Chloride: 100 mEq/L (ref 96–112)
GFR, EST NON AFRICAN AMERICAN: 86 mL/min — AB (ref >90)
Glucose, Bld: 146 mg/dL — ABNORMAL HIGH (ref 70–99)
Potassium: 4 mEq/L (ref 3.7–5.3)
Sodium: 140 mEq/L (ref 137–147)

## 2013-11-17 MED ORDER — CLONIDINE HCL 0.2 MG PO TABS: 0.2000 mg | ORAL_TABLET | Freq: Two times a day (BID) | ORAL | Status: DC

## 2013-11-17 MED ORDER — LISINOPRIL 40 MG PO TABS: 40.0000 mg | ORAL_TABLET | Freq: Every day | ORAL | Status: DC

## 2013-11-17 MED ORDER — TAMSULOSIN HCL 0.4 MG PO CAPS: 0.4000 mg | ORAL_CAPSULE | Freq: Every day | ORAL | Status: AC

## 2013-11-17 MED ORDER — PREDNISONE 20 MG PO TABS: 20.0000 mg | ORAL_TABLET | Freq: Two times a day (BID) | ORAL | Status: DC

## 2013-11-17 NOTE — Progress Notes (Addendum)
[facility]. The agency has been called and order has been faxed over. Pt is in NAD, IV is out, all paperwork has been reviewed/discussed with patient, and there are no questions/concerns at this time. Assessment is unchanged from this morning. Pt is to be accompanied downstairs by staff and family via wheelchair.

## 2013-11-17 NOTE — Progress Notes (Signed)
Oscar Hickman, GOZA NO.:  [id number]  MEDICAL RECORD NO.:  [id number]  LOCATION:  Q683                          FACILITY:  [facility]  PHYSICIAN:  Unk Lightning, MDDATE OF BIRTH:  05-Oct-1941  DATE OF PROCEDURE: DATE OF DISCHARGE:                                PROGRESS NOTE   The patient has diabetes, had accelerated hypertension, dyspnea on exertion upon admission now hopefully resolved.  Her blood pressure was controlled at 123/74, sinus rhythm, respiratory rate is 20, afebrile, pulse is 73, O2 sat is 94% on room air.  The patient has good glycemic control.  Steroids being reduced for his bronchospastic component. Creatinine 1.02.  Lungs show prolonged expiratory phase, minimal end- expiratory wheeze.  No rales, no rhonchi appreciable.  Heart, regular rate and rhythm.  No S3-S4.  No heaves, thrills, or rubs.  PLAN:  Right now is to pursue oral steroids, discharge in 24 hours if the patient continues to improve.     Unk Lightning, MD     RMD/MEDQ  D:  11/16/2013  T:  11/17/2013  Job:  [id number]

## 2013-11-17 NOTE — Discharge Summary (Signed)
NAME:  Oscar Hickman, Oscar Hickman NO.:  [id number]  MEDICAL RECORD NO.:  [id number]  LOCATION:  S341                          FACILITY:  [facility]  PHYSICIAN:  Unk Lightning, MDDATE OF BIRTH:  [birth year]  DATE OF ADMISSION:  11/13/2013 DATE OF DISCHARGE:  01/10/2015LH                              DISCHARGE SUMMARY   The patient is a 73 year old black male with cognitive dysfunction, insulin-dependent diabetes, significant hypertension, hyperlipidemia, status post bladder carcinoma with bladder outlet obstruction, rectified with Surgery and with hormonal mediation every 3 months, Lupron and chronic noncompliance with medicines despite supervision by his wife. The patient came in with increasing dyspnea with accelerated hypertension, systolics in the range of 180/100.  He was felt to have a bronchospastic component.  He was treated for both accelerated hypertension with minimal nonischemic cardiomyopathy, ejection fraction 45-50% and COPD, was given intravenous steroids in the form of Solu- Medrol and tapered over several days, DuoNeb nebulizer, aggressive antihypertensive regimen.  His systolics came down between 120 and 130. He was no longer dyspneic and was doing well over the last 3 days in the hospital.  We monitored his renal function which was fine, his electrolytes looked good.  His bladder outlet issues were totally controlled.  The patient had good glycemic control, on Lantus 25 and was subsequently discharged on the following medicines; clonidine 0.2 mg p.o. b.i.d., lisinopril 40 mg p.o. daily, prednisone 20 mg p.o. daily for 7 days and then discontinue, Flomax 0.4 mg p.o. daily, Zytiga 250 mg tablet as directed by Urology, Norvasc 10 mg p.o. daily, aspirin 81 mg p.o. daily, Coreg 25 mg p.o. b.i.d., hydralazine 50 mg p.o. t.i.d., Lantus insulin 25 units subcu at bedtime, Isordil 20 mg p.o. t.i.d. as prescribed by his cardiologist, Glucophage 500 mg p.o. b.i.d.   The patient was instructed on the importance of taking all antihypertensive medicines as well as his insulin.  He verbalizes understanding of this with his wife in the room.  She does organize his medicines.  It was felt safe to discharge the patient to follow up in the office in 3-7 days' time for assessment of glycemic control, electrolytes, renal function, and hemodynamics.     Unk Lightning, MD     RMD/MEDQ  D:  11/17/2013  T:  11/17/2013  Job:  [id number]

## 2013-11-17 NOTE — Discharge Summary (Signed)
[id number] 

## 2013-11-17 NOTE — Plan of Care (Signed)
Problem: Phase I Progression Outcomes Goal: Dyspnea controlled at rest (HF) Outcome: Completed/Met Date Met:  11/17/13 Pt still has some expiratory wheezing, but is no loner experiencing dyspnea at rest

## 2013-11-23 ENCOUNTER — Other Ambulatory Visit (HOSPITAL_COMMUNITY): Payer: Self-pay | Admitting: Oncology

## 2013-11-23 DIAGNOSIS — C61 Malignant neoplasm of prostate: Secondary | ICD-10-CM

## 2013-11-23 MED ORDER — PREDNISONE 5 MG PO TABS: 5.0000 mg | ORAL_TABLET | Freq: Two times a day (BID) | ORAL | Status: DC

## 2013-11-24 ENCOUNTER — Encounter (HOSPITAL_COMMUNITY): Payer: Self-pay | Admitting: Emergency Medicine

## 2013-11-24 ENCOUNTER — Inpatient Hospital Stay (HOSPITAL_COMMUNITY)
Admission: EM | Admit: 2013-11-24 | Discharge: 2013-11-29 | DRG: 292 | Disposition: A | Discharge status: Home Health | Payer: Medicare Other | Attending: Family Medicine | Admitting: Family Medicine

## 2013-11-24 ENCOUNTER — Emergency Department (HOSPITAL_COMMUNITY): Payer: Medicare Other

## 2013-11-24 DIAGNOSIS — M1990 Unspecified osteoarthritis, unspecified site: Secondary | ICD-10-CM | POA: Diagnosis present

## 2013-11-24 DIAGNOSIS — D649 Anemia, unspecified: Secondary | ICD-10-CM | POA: Diagnosis present

## 2013-11-24 DIAGNOSIS — F411 Generalized anxiety disorder: Secondary | ICD-10-CM | POA: Diagnosis present

## 2013-11-24 DIAGNOSIS — N320 Bladder-neck obstruction: Secondary | ICD-10-CM | POA: Diagnosis present

## 2013-11-24 DIAGNOSIS — Z91199 Patient's noncompliance with other medical treatment and regimen due to unspecified reason: Secondary | ICD-10-CM

## 2013-11-24 DIAGNOSIS — Z9079 Acquired absence of other genital organ(s): Secondary | ICD-10-CM

## 2013-11-24 DIAGNOSIS — Z9119 Patient's noncompliance with other medical treatment and regimen: Secondary | ICD-10-CM

## 2013-11-24 DIAGNOSIS — Z923 Personal history of irradiation: Secondary | ICD-10-CM

## 2013-11-24 DIAGNOSIS — I509 Heart failure, unspecified: Secondary | ICD-10-CM | POA: Diagnosis present

## 2013-11-24 DIAGNOSIS — J449 Chronic obstructive pulmonary disease, unspecified: Secondary | ICD-10-CM | POA: Diagnosis present

## 2013-11-24 DIAGNOSIS — I2589 Other forms of chronic ischemic heart disease: Secondary | ICD-10-CM | POA: Diagnosis present

## 2013-11-24 DIAGNOSIS — J4489 Other specified chronic obstructive pulmonary disease: Secondary | ICD-10-CM | POA: Diagnosis present

## 2013-11-24 DIAGNOSIS — Z794 Long term (current) use of insulin: Secondary | ICD-10-CM

## 2013-11-24 DIAGNOSIS — E119 Type 2 diabetes mellitus without complications: Secondary | ICD-10-CM | POA: Diagnosis present

## 2013-11-24 DIAGNOSIS — Z9221 Personal history of antineoplastic chemotherapy: Secondary | ICD-10-CM

## 2013-11-24 DIAGNOSIS — E876 Hypokalemia: Secondary | ICD-10-CM | POA: Diagnosis present

## 2013-11-24 DIAGNOSIS — E785 Hyperlipidemia, unspecified: Secondary | ICD-10-CM | POA: Diagnosis present

## 2013-11-24 DIAGNOSIS — F09 Unspecified mental disorder due to known physiological condition: Secondary | ICD-10-CM | POA: Diagnosis present

## 2013-11-24 DIAGNOSIS — I10 Essential (primary) hypertension: Secondary | ICD-10-CM | POA: Diagnosis present

## 2013-11-24 DIAGNOSIS — G609 Hereditary and idiopathic neuropathy, unspecified: Secondary | ICD-10-CM | POA: Diagnosis present

## 2013-11-24 DIAGNOSIS — I5023 Acute on chronic systolic (congestive) heart failure: Principal | ICD-10-CM | POA: Diagnosis present

## 2013-11-24 DIAGNOSIS — C61 Malignant neoplasm of prostate: Secondary | ICD-10-CM | POA: Diagnosis present

## 2013-11-24 HISTORY — DX: Unspecified mental disorder due to known physiological condition: F09

## 2013-11-24 LAB — CBC WITH DIFFERENTIAL/PLATELET
Basophils Absolute: 0 10*3/uL (ref 0.0–0.1)
Basophils Relative: 0 % (ref 0–1)
Eosinophils Absolute: 0.1 10*3/uL (ref 0.0–0.7)
Eosinophils Relative: 1 % (ref 0–5)
HCT: 29.9 % — ABNORMAL LOW (ref 39.0–52.0)
HEMOGLOBIN: 9.7 g/dL — AB (ref 13.0–17.0)
LYMPHS ABS: 0.8 10*3/uL (ref 0.7–4.0)
Lymphocytes Relative: 10 % — ABNORMAL LOW (ref 12–46)
MCH: 27 pg (ref 26.0–34.0)
MCHC: 32.4 g/dL (ref 30.0–36.0)
MCV: 83.3 fL (ref 78.0–100.0)
MONOS PCT: 8 % (ref 3–12)
Monocytes Absolute: 0.6 10*3/uL (ref 0.1–1.0)
NEUTROS PCT: 80 % — AB (ref 43–77)
Neutro Abs: 6.1 10*3/uL (ref 1.7–7.7)
Platelets: 310 10*3/uL (ref 150–400)
RBC: 3.59 MIL/uL — AB (ref 4.22–5.81)
RDW: 17.5 % — ABNORMAL HIGH (ref 11.5–15.5)
WBC: 7.6 10*3/uL (ref 4.0–10.5)

## 2013-11-24 LAB — BASIC METABOLIC PANEL
BUN: 20 mg/dL (ref 6–23)
CO2: 29 mEq/L (ref 19–32)
Calcium: 8.7 mg/dL (ref 8.4–10.5)
Chloride: 104 mEq/L (ref 96–112)
Creatinine, Ser: 0.78 mg/dL (ref 0.50–1.35)
GFR calc Af Amer: >90 mL/min (ref >90)
GFR calc non Af Amer: 89 mL/min — ABNORMAL LOW (ref >90)
GLUCOSE: 145 mg/dL — AB (ref 70–99)
Potassium: 4.1 mEq/L (ref 3.7–5.3)
Sodium: 141 mEq/L (ref 137–147)

## 2013-11-24 LAB — TROPONIN I: Troponin I: <0.3 ng/mL (ref <0.30)

## 2013-11-24 LAB — PRO B NATRIURETIC PEPTIDE: Pro B Natriuretic peptide (BNP): 3815 pg/mL — ABNORMAL HIGH (ref 0–125)

## 2013-11-24 LAB — GLUCOSE, CAPILLARY
GLUCOSE-CAPILLARY: 200 mg/dL — AB (ref 70–99)
Glucose-Capillary: 114 mg/dL — ABNORMAL HIGH (ref 70–99)

## 2013-11-24 MED ORDER — ONDANSETRON HCL 4 MG PO TABS: 4.0000 mg | ORAL_TABLET | Freq: Four times a day (QID) | ORAL | Status: DC | PRN

## 2013-11-24 MED ORDER — ONDANSETRON HCL 4 MG/2ML IJ SOLN: 4.0000 mg | Freq: Four times a day (QID) | INTRAMUSCULAR | Status: DC | PRN

## 2013-11-24 MED ORDER — ACETAMINOPHEN 325 MG PO TABS
650.0000 mg | ORAL_TABLET | Freq: Four times a day (QID) | ORAL | Status: DC | PRN
  Administered 2013-11-28: 650 mg via ORAL
  Filled 2013-11-24 (×2): qty 2

## 2013-11-24 MED ORDER — CLONIDINE HCL 0.2 MG PO TABS
0.2000 mg | ORAL_TABLET | Freq: Two times a day (BID) | ORAL | Status: DC
  Administered 2013-11-24 – 2013-11-29 (×10): 0.2 mg via ORAL
  Filled 2013-11-24 (×11): qty 1

## 2013-11-24 MED ORDER — INSULIN GLARGINE 100 UNIT/ML ~~LOC~~ SOLN: 25.0000 [IU] | Freq: Every day | SUBCUTANEOUS | Status: DC

## 2013-11-24 MED ORDER — CARVEDILOL 12.5 MG PO TABS
25.0000 mg | ORAL_TABLET | Freq: Two times a day (BID) | ORAL | Status: DC
  Administered 2013-11-24 – 2013-11-29 (×11): 25 mg via ORAL
  Filled 2013-11-24 (×6): qty 2
  Filled 2013-11-24: qty 1
  Filled 2013-11-24 (×3): qty 2
  Filled 2013-11-24: qty 1
  Filled 2013-11-24: qty 2
  Filled 2013-11-24 (×2): qty 1
  Filled 2013-11-24: qty 2

## 2013-11-24 MED ORDER — IPRATROPIUM BROMIDE 0.02 % IN SOLN: 0.5000 mg | Freq: Four times a day (QID) | RESPIRATORY_TRACT | Status: DC

## 2013-11-24 MED ORDER — PREDNISONE 10 MG PO TABS
5.0000 mg | ORAL_TABLET | Freq: Two times a day (BID) | ORAL | Status: DC
  Administered 2013-11-25 – 2013-11-29 (×10): 5 mg via ORAL
  Filled 2013-11-24 (×10): qty 1

## 2013-11-24 MED ORDER — HYDRALAZINE HCL 25 MG PO TABS
50.0000 mg | ORAL_TABLET | Freq: Three times a day (TID) | ORAL | Status: DC
  Administered 2013-11-24 – 2013-11-29 (×16): 50 mg via ORAL
  Filled 2013-11-24 (×2): qty 2
  Filled 2013-11-24: qty 1
  Filled 2013-11-24: qty 2
  Filled 2013-11-24 (×2): qty 1
  Filled 2013-11-24 (×2): qty 2
  Filled 2013-11-24 (×2): qty 1
  Filled 2013-11-24: qty 2
  Filled 2013-11-24: qty 1
  Filled 2013-11-24 (×10): qty 2

## 2013-11-24 MED ORDER — INSULIN GLARGINE 100 UNIT/ML ~~LOC~~ SOLN
25.0000 [IU] | Freq: Every day | SUBCUTANEOUS | Status: DC
  Administered 2013-11-25 – 2013-11-26 (×2): 25 [IU] via SUBCUTANEOUS
  Filled 2013-11-24 (×3): qty 0.25

## 2013-11-24 MED ORDER — FUROSEMIDE 10 MG/ML IJ SOLN
40.0000 mg | Freq: Once | INTRAMUSCULAR | Status: AC
  Administered 2013-11-24: 40 mg via INTRAVENOUS
  Filled 2013-11-24: qty 4

## 2013-11-24 MED ORDER — ALUM & MAG HYDROXIDE-SIMETH 200-200-20 MG/5ML PO SUSP: 30.0000 mL | Freq: Four times a day (QID) | ORAL | Status: DC | PRN

## 2013-11-24 MED ORDER — ENOXAPARIN SODIUM 40 MG/0.4ML ~~LOC~~ SOLN
40.0000 mg | SUBCUTANEOUS | Status: DC
  Administered 2013-11-24 – 2013-11-28 (×5): 40 mg via SUBCUTANEOUS
  Filled 2013-11-24 (×6): qty 0.4

## 2013-11-24 MED ORDER — SODIUM CHLORIDE 0.9 % IV SOLN
250.0000 mL | INTRAVENOUS | Status: DC | PRN
Start: 2013-11-24 — End: 2013-11-29

## 2013-11-24 MED ORDER — IPRATROPIUM-ALBUTEROL 0.5-2.5 (3) MG/3ML IN SOLN
3.0000 mL | Freq: Four times a day (QID) | RESPIRATORY_TRACT | Status: DC
  Administered 2013-11-24: 3 mL via RESPIRATORY_TRACT
  Filled 2013-11-24: qty 3

## 2013-11-24 MED ORDER — INSULIN ASPART 100 UNIT/ML ~~LOC~~ SOLN
0.0000 [IU] | Freq: Three times a day (TID) | SUBCUTANEOUS | Status: DC
  Administered 2013-11-25: 5 [IU] via SUBCUTANEOUS
  Administered 2013-11-26 – 2013-11-27 (×2): 3 [IU] via SUBCUTANEOUS
  Administered 2013-11-28: 2 [IU] via SUBCUTANEOUS
  Administered 2013-11-28: 3 [IU] via SUBCUTANEOUS
  Administered 2013-11-29: 2 [IU] via SUBCUTANEOUS

## 2013-11-24 MED ORDER — IPRATROPIUM BROMIDE 0.02 % IN SOLN
0.5000 mg | Freq: Once | RESPIRATORY_TRACT | Status: AC
  Administered 2013-11-24: 0.5 mg via RESPIRATORY_TRACT
  Filled 2013-11-24: qty 2.5

## 2013-11-24 MED ORDER — TAMSULOSIN HCL 0.4 MG PO CAPS
0.4000 mg | ORAL_CAPSULE | Freq: Every day | ORAL | Status: DC
Start: 2013-11-25 — End: 2013-11-29
  Administered 2013-11-25 – 2013-11-29 (×5): 0.4 mg via ORAL
  Filled 2013-11-24 (×7): qty 1

## 2013-11-24 MED ORDER — ABIRATERONE ACETATE 250 MG PO TABS
1000.0000 mg | ORAL_TABLET | ORAL | Status: DC
  Administered 2013-11-25 – 2013-11-29 (×5): 1000 mg via ORAL

## 2013-11-24 MED ORDER — ISOSORBIDE DINITRATE 20 MG PO TABS
20.0000 mg | ORAL_TABLET | Freq: Three times a day (TID) | ORAL | Status: DC
Start: 2013-11-24 — End: 2013-11-29
  Administered 2013-11-24 – 2013-11-29 (×15): 20 mg via ORAL
  Filled 2013-11-24 (×21): qty 1

## 2013-11-24 MED ORDER — LISINOPRIL 10 MG PO TABS
40.0000 mg | ORAL_TABLET | Freq: Every day | ORAL | Status: DC
  Administered 2013-11-25 – 2013-11-29 (×5): 40 mg via ORAL
  Filled 2013-11-24 (×5): qty 4

## 2013-11-24 MED ORDER — AMLODIPINE BESYLATE 5 MG PO TABS
10.0000 mg | ORAL_TABLET | Freq: Every day | ORAL | Status: DC
  Administered 2013-11-25 – 2013-11-29 (×5): 10 mg via ORAL
  Filled 2013-11-24 (×5): qty 2

## 2013-11-24 MED ORDER — ASPIRIN EC 81 MG PO TBEC
81.0000 mg | DELAYED_RELEASE_TABLET | Freq: Every day | ORAL | Status: DC
  Administered 2013-11-25 – 2013-11-29 (×5): 81 mg via ORAL
  Filled 2013-11-24 (×7): qty 1

## 2013-11-24 MED ORDER — SODIUM CHLORIDE 0.9 % IJ SOLN
3.0000 mL | Freq: Two times a day (BID) | INTRAMUSCULAR | Status: DC
Start: 2013-11-24 — End: 2013-11-29
  Administered 2013-11-25 – 2013-11-29 (×8): 3 mL via INTRAVENOUS

## 2013-11-24 MED ORDER — ACETAMINOPHEN 650 MG RE SUPP: 650.0000 mg | Freq: Four times a day (QID) | RECTAL | Status: DC | PRN

## 2013-11-24 MED ORDER — FUROSEMIDE 10 MG/ML IJ SOLN
40.0000 mg | Freq: Three times a day (TID) | INTRAMUSCULAR | Status: DC
  Administered 2013-11-24 – 2013-11-25 (×2): 40 mg via INTRAVENOUS
  Filled 2013-11-24 (×2): qty 4

## 2013-11-24 MED ORDER — SODIUM CHLORIDE 0.9 % IJ SOLN
3.0000 mL | Freq: Two times a day (BID) | INTRAMUSCULAR | Status: DC
  Administered 2013-11-24 – 2013-11-27 (×4): 3 mL via INTRAVENOUS

## 2013-11-24 MED ORDER — SODIUM CHLORIDE 0.9 % IJ SOLN: 3.0000 mL | INTRAMUSCULAR | Status: DC | PRN

## 2013-11-24 MED ORDER — ALBUTEROL SULFATE (5 MG/ML) 0.5% IN NEBU
2.5000 mg | INHALATION_SOLUTION | Freq: Four times a day (QID) | RESPIRATORY_TRACT | Status: DC | PRN
Start: 2013-11-24 — End: 2013-11-24

## 2013-11-24 MED ORDER — HOME MED STORE IN PYXIS: 1.0000 | Freq: Every day | Status: DC

## 2013-11-24 MED ORDER — ALBUTEROL SULFATE (2.5 MG/3ML) 0.083% IN NEBU
2.5000 mg | INHALATION_SOLUTION | Freq: Once | RESPIRATORY_TRACT | Status: AC
  Administered 2013-11-24: 2.5 mg via RESPIRATORY_TRACT
  Filled 2013-11-24: qty 3

## 2013-11-24 MED ORDER — INSULIN ASPART 100 UNIT/ML ~~LOC~~ SOLN: 0.0000 [IU] | Freq: Every day | SUBCUTANEOUS | Status: DC

## 2013-11-24 NOTE — ED Notes (Signed)
Urinal at bedside for pt use

## 2013-11-24 NOTE — ED Provider Notes (Signed)
CSN: [id number]     Arrival date & time 11/24/13  1344 History   First MD Initiated Contact with Patient 11/24/13 1408     Chief Complaint  Patient presents with  . Shortness of Breath    HPI Pt was seen at 1410. Per pt, c/o gradual onset and worsening of persistent SOB and "wheezing" since last night. Pt states he was "up all night" because he felt SOB. States he was recently admitted for CHF, denies hx COPD. Unclear if pt has been taking his meds a prescribed since hospital discharge. Denies CP/palpitations, no cough, no abd pain, no N/V/D, no fevers.    Past Medical History  Diagnosis Date  . Skin infection     history  . HTN (hypertension) 11/23/2011  . High cholesterol   . Diabetes mellitus   . Chronic headache     daily  . Anxiety   . Osteoarthritis   . COPD (chronic obstructive pulmonary disease)     patient denies  . Peripheral neuropathy   . 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 CHF (congestive heart failure)   . Medical non-compliance   . Heart disease   . Cognitive dysfunction    Past Surgical History  Procedure Laterality Date  . Transurethral resection of prostate  03/2008  . Cardiac catheterization  02/2009    minimal non-obstructive CAD  . Cystoscopy w/ ureteral stent placement Right 04/06/2013    Procedure: CYSTOSCOPY WITH RETROGRADE PYELOGRAM/URETERAL STENT PLACEMENT;  Surgeon: Ky Barban, MD;  Location: [facility];  Service: Urology;  Laterality: Right;  . Cystoscopy w/ ureteral stent removal Right 04/06/2013    Procedure: CYSTOSCOPY WITH STENT REMOVAL;  Surgeon: Ky Barban, MD;  Location: [facility];  Service: Urology;  Laterality: Right;   Family History  Problem Relation Age of Onset  . Cancer Mother   . Cancer Father    History  Substance Use Topics  . Smoking status: Never Smoker   . Smokeless tobacco: Never Used  . Alcohol Use: No    Review of Systems ROS: Statement: All systems negative except as marked or  noted in the HPI; Constitutional: Negative for fever and chills. ; ; Eyes: Negative for eye pain, redness and discharge. ; ; ENMT: Negative for ear pain, hoarseness, nasal congestion, sinus pressure and sore throat. ; ; Cardiovascular: Negative for chest pain, palpitations, diaphoresis and peripheral edema. ; ; Respiratory: +SOB, wheezing. Negative for cough and stridor. ; ; Gastrointestinal: Negative for nausea, vomiting, diarrhea, abdominal pain, blood in stool, hematemesis, jaundice and rectal bleeding. . ; ; Genitourinary: Negative for dysuria, flank pain and hematuria. ; ; Musculoskeletal: Negative for back pain and neck pain. Negative for swelling and trauma.; ; Skin: Negative for pruritus, rash, abrasions, blisters, bruising and skin lesion.; ; Neuro: Negative for headache, lightheadedness and neck stiffness. Negative for weakness, altered level of consciousness , altered mental status, extremity weakness, paresthesias, involuntary movement, seizure and syncope.       Allergies  Review of patient's allergies indicates no known allergies.  Home Medications   Current Outpatient Rx  Name  Route  Sig  Dispense  Refill  . abiraterone Acetate (ZYTIGA) 250 MG tablet   Oral   Take 4 tablets (1,000 mg total) by mouth daily. Take on an empty stomach 1 hour before or 2 hours after a meal   120 tablet   0   . amLODipine (NORVASC) 10 MG tablet   Oral  Take 10 mg by mouth daily.         Marland Kitchen aspirin EC 81 MG tablet   Oral   Take 81 mg by mouth daily.         . carvedilol (COREG) 25 MG tablet   Oral   Take 25 mg by mouth 2 (two) times daily with a meal.         . cloNIDine (CATAPRES) 0.2 MG tablet   Oral   Take 1 tablet (0.2 mg total) by mouth 2 (two) times daily.   60 tablet   2   . hydrALAZINE (APRESOLINE) 50 MG tablet   Oral   Take 50 mg by mouth 3 (three) times daily.         . insulin glargine (LANTUS) 100 UNIT/ML injection   Subcutaneous   Inject 25-26 Units into the  skin daily.          . isosorbide dinitrate (ISORDIL) 20 MG tablet   Oral   Take 20 mg by mouth 3 (three) times daily.          Marland Kitchen lisinopril (PRINIVIL,ZESTRIL) 40 MG tablet   Oral   Take 1 tablet (40 mg total) by mouth daily.   30 tablet   2   . metFORMIN (GLUCOPHAGE) 500 MG tablet   Oral   Take 500 mg by mouth 2 (two) times daily with a meal.           . predniSONE (DELTASONE) 20 MG tablet   Oral   Take 1 tablet (20 mg total) by mouth 2 (two) times daily with a meal.   7 tablet   0   . predniSONE (DELTASONE) 5 MG tablet   Oral   Take 1 tablet (5 mg total) by mouth 2 (two) times daily with a meal.   60 tablet   0   . tamsulosin (FLOMAX) 0.4 MG CAPS capsule   Oral   Take 1 capsule (0.4 mg total) by mouth daily.   30 capsule   1    BP 150/85  Pulse 79  Temp(Src) 97.8 F (36.6 C) (Oral)  Resp 25  Ht 5\' 6"  (1.676 m)  Wt 190 lb (86.183 kg)  BMI 30.68 kg/m2  SpO2 100% Physical Exam 1415: Physical examination:  Nursing notes reviewed; Vital signs and O2 SAT reviewed;  Constitutional: Well developed, Well nourished, In no acute distress; Head:  Normocephalic, atraumatic; Eyes: EOMI, PERRL, No scleral icterus; ENMT: Mouth and pharynx normal, Mucous membranes dry; Neck: Supple, Full range of motion, No lymphadenopathy; Cardiovascular: Regular rate and rhythm, No gallop; Respiratory: Breath sounds coarse & equal bilaterally, exp wheezes bilat. No audible wheezing. Speaking full sentences, sitting upright. No retrax or access mm use. Normal respiratory effort/excursion; Chest: Nontender, Movement normal; Abdomen: Soft, Nontender, Nondistended, Normal bowel sounds; Genitourinary: No CVA tenderness; Extremities: Pulses normal, No tenderness, +1 pedal edema bilat without calf asymmetry.; Neuro: AA&Ox3, vague historian. Major CN grossly intact.  Speech clear. No gross focal motor or sensory deficits in extremities.; Skin: Color normal, Warm, Dry.   ED Course  Procedures    EKG Interpretation    Date/Time:  Saturday November 24 2013 13:55:44 EST Ventricular Rate:  84 PR Interval:  154 QRS Duration: 114 QT Interval:  392 QTC Calculation: 463 R Axis:   -43 Text Interpretation:  Normal sinus rhythm Left axis deviation Nonspecific ST and T wave abnormality Prolonged QT Abnormal ECG When compared with ECG of 13-Nov-2013 15:07, Abberant conduction is no longer Present  Confirmed by [facility]  MD, Nunzio Cory [phone]) on 11/24/2013 2:35:45 PM            MDM  MDM Reviewed: previous chart, nursing note and vitals Reviewed previous: labs and ECG Interpretation: labs, x-ray and ECG     Results for orders placed during the hospital encounter of 11/24/13  CBC WITH DIFFERENTIAL      Result Value Range   WBC 7.6  4.0 - 10.5 K/uL   RBC 3.59 (*) 4.22 - 5.81 MIL/uL   Hemoglobin 9.7 (*) 13.0 - 17.0 g/dL   HCT 29.9 (*) 39.0 - 52.0 %   MCV 83.3  78.0 - 100.0 fL   MCH 27.0  26.0 - 34.0 pg   MCHC 32.4  30.0 - 36.0 g/dL   RDW 17.5 (*) 11.5 - 15.5 %   Platelets 310  150 - 400 K/uL   Neutrophils Relative % 80 (*) 43 - 77 %   Neutro Abs 6.1  1.7 - 7.7 K/uL   Lymphocytes Relative 10 (*) 12 - 46 %   Lymphs Abs 0.8  0.7 - 4.0 K/uL   Monocytes Relative 8  3 - 12 %   Monocytes Absolute 0.6  0.1 - 1.0 K/uL   Eosinophils Relative 1  0 - 5 %   Eosinophils Absolute 0.1  0.0 - 0.7 K/uL   Basophils Relative 0  0 - 1 %   Basophils Absolute 0.0  0.0 - 0.1 K/uL  BASIC METABOLIC PANEL      Result Value Range   Sodium 141  137 - 147 mEq/L   Potassium 4.1  3.7 - 5.3 mEq/L   Chloride 104  96 - 112 mEq/L   CO2 29  19 - 32 mEq/L   Glucose, Bld 145 (*) 70 - 99 mg/dL   BUN 20  6 - 23 mg/dL   Creatinine, Ser 0.78  0.50 - 1.35 mg/dL   Calcium 8.7  8.4 - 10.5 mg/dL   GFR calc non Af Amer 89 (*) >90 mL/min   GFR calc Af Amer >90  >90 mL/min  TROPONIN I      Result Value Range   Troponin I <0.30  <0.30 ng/mL  PRO B NATRIURETIC PEPTIDE      Result Value Range   Pro B Natriuretic  peptide (BNP) 3815.0 (*) 0 - 125 pg/mL   Dg Chest Portable 1 View 11/24/2013   CLINICAL DATA:  Congestion and dyspnea and weakness.  EXAM: PORTABLE CHEST - 1 VIEW  COMPARISON:  PA and lateral chest x-ray at November 13, 2012  FINDINGS: The cardiopericardial silhouette has increased further in size. The pulmonary vascularity is engorged and indistinct. The pulmonary interstitial markings are increased bilaterally. The left hemidiaphragm is partially obscured. There is tortuosity of the descending thoracic aorta. The observed portions of the bony thorax exhibit no acute abnormalities.  IMPRESSION: Enlargement of the cardiopericardial silhouette, pulmonary vascular congestion, and pulmonary interstitial edema are consistent with congestive heart failure. There is no pleural effusion demonstrated and no evidence of an alveolar infiltrate. There has been a deterioration in the appearance of the chest since the previous study.   Electronically Signed   By: David  Martinique   On: 11/24/2013 14:15    Results for ISRAEL, WERTS (MRN [id number]) as of 11/24/2013 15:06  Ref. Range 05/10/2013 13:09 09/22/2013 11:50 10/30/2013 17:19 11/13/2013 16:46 11/24/2013 14:06  Pro B Natriuretic peptide (BNP) Latest Range: 0-125 pg/mL 1740.0 (H) 1233.0 (H) 2771.0 (H) 3238.0 (H) 3815.0 (H)  1450:  Short neb given on arrival for wheezing. IV lasix given for CHF on CXR. Sats 100% on O2 1L N/C, lungs coarse bilat, speaking in full sentences without distress. Dx and testing d/w pt and family.  Questions answered.  Verb understanding, agreeable to admit. T/C to Dr. Willey Blade, case discussed, including:  HPI, pertinent PM/SHx, VS/PE, dx testing, ED course and treatment:  Agreeable to admit.         Alfonzo Feller, DO 11/26/13 1235

## 2013-11-24 NOTE — ED Notes (Signed)
Recent admission and d/c'd for CHF, c/o SOB and denies pain

## 2013-11-25 LAB — GLUCOSE, CAPILLARY
GLUCOSE-CAPILLARY: 96 mg/dL (ref 70–99)
GLUCOSE-CAPILLARY: 204 mg/dL — AB (ref 70–99)
Glucose-Capillary: 104 mg/dL — ABNORMAL HIGH (ref 70–99)
Glucose-Capillary: 64 mg/dL — ABNORMAL LOW (ref 70–99)
Glucose-Capillary: 172 mg/dL — ABNORMAL HIGH (ref 70–99)

## 2013-11-25 LAB — BASIC METABOLIC PANEL
BUN: 17 mg/dL (ref 6–23)
CALCIUM: 8.6 mg/dL (ref 8.4–10.5)
CO2: 34 mEq/L — ABNORMAL HIGH (ref 19–32)
Chloride: 102 mEq/L (ref 96–112)
Creatinine, Ser: 0.87 mg/dL (ref 0.50–1.35)
GFR calc Af Amer: >90 mL/min (ref >90)
GFR calc non Af Amer: 85 mL/min — ABNORMAL LOW (ref >90)
Glucose, Bld: 98 mg/dL (ref 70–99)
Potassium: 3.5 mEq/L — ABNORMAL LOW (ref 3.7–5.3)
Sodium: 143 mEq/L (ref 137–147)

## 2013-11-25 MED ORDER — POTASSIUM CHLORIDE CRYS ER 20 MEQ PO TBCR
20.0000 meq | EXTENDED_RELEASE_TABLET | Freq: Three times a day (TID) | ORAL | Status: AC
  Administered 2013-11-25 – 2013-11-26 (×6): 20 meq via ORAL
  Filled 2013-11-25 (×6): qty 1

## 2013-11-25 MED ORDER — IPRATROPIUM-ALBUTEROL 0.5-2.5 (3) MG/3ML IN SOLN
3.0000 mL | Freq: Four times a day (QID) | RESPIRATORY_TRACT | Status: DC
  Administered 2013-11-25 – 2013-11-27 (×8): 3 mL via RESPIRATORY_TRACT
  Filled 2013-11-25 (×8): qty 3

## 2013-11-25 MED ORDER — FUROSEMIDE 10 MG/ML IJ SOLN
40.0000 mg | Freq: Two times a day (BID) | INTRAMUSCULAR | Status: DC
  Administered 2013-11-25 – 2013-11-29 (×7): 40 mg via INTRAVENOUS
  Filled 2013-11-25 (×7): qty 4

## 2013-11-25 NOTE — H&P (Signed)
NAME:  Oscar Hickman, Oscar Hickman             ACCOUNT NO.:  [id number]  MEDICAL RECORD NO.:  [id number]  LOCATION:  [facility]                         FACILITY:  [facility]  PHYSICIAN:  Paula Compton. Willey Blade, MD       DATE OF BIRTH:  1941/04/16  DATE OF ADMISSION:  11/24/2013 DATE OF DISCHARGE:  LH                             HISTORY & PHYSICAL   CHIEF COMPLAINT:  Shortness of breath.  HISTORY OF PRESENT ILLNESS:  This patient is a 73 year old, African- American male patient of Dr. Lorriane Shire, who presented with shortness of breath at rest and dyspnea on exertion, has a history of congestive heart failure.  He also has a history of COPD.  He had recently been hospitalized and treated with bronchodilators and corticosteroids.  It appears he has not been on diuretic recently.  His last echocardiogram was in July 2014.  He had an ejection fraction of 45% with posterior hypokinesis.  The patient has noticed some swelling in his legs.  He is unsure about possible weight gain.  He denies orthopnea.  PAST MEDICAL HISTORY: 1. Congestive heart failure. 2. Hypertension. 3. Hyperlipidemia. 4. Diabetes. 5. Chronic headache. 6. Anxiety. 7. Osteoarthritis. 8. Chronic obstructive pulmonary disease. 9. Peripheral neuropathy. 10.Carcinoma of the prostate status post prostatectomy and radiation     therapy, now on chemotherapy. 11.Cognitive dysfunction.  MEDICATIONS:  Zytiga 1000 mg daily, amlodipine 10 mg daily, aspirin 81 mg daily, carvedilol 25 mg b.i.d., clonidine 0.2 mg b.i.d., hydralazine 50 mg t.i.d., Lantus 25 units daily, isosorbide 20 mg t.i.d., lisinopril 40 mg daily, metformin 500 mg b.i.d., prednisone 5 mg b.i.d., tamsulosin 0.4 mg daily.  ALLERGIES:  No known drug allergies.  SOCIAL HISTORY:  He denies tobacco, alcohol, or recreational substance use.  FAMILY HISTORY:  Remarkable for cancer in both parents.  REVIEW OF SYSTEMS:  No fever, purulent sputum production, angina, syncope, abdominal pain, or  change in bowel habits.  PHYSICAL EXAMINATION:  VITAL SIGNS:  Temperature 97.3, pulse 75, respirations 21, blood pressure 116/64, oxygen saturation 94%. GENERAL:  Alert and mildly dyspneic appearing. HEENT:  Eyes, nose, and oropharynx are unremarkable.  The neck reveals 6 cm JVD. LUNGS:  Bibasilar rales. HEART:  Regular with no murmurs. ABDOMEN:  Soft and nontender with no palpable organomegaly. EXTREMITIES:  Reveal 1+ edema in the lower legs. NEUROLOGIC:  No focal weakness. LYMPH NODES:  No cervical or supraclavicular enlargement.  LABORATORY DATA:  Sodium 141, potassium 4.1, bicarb 29, BUN 20, creatinine 0.78, glucose 145.  ProBNP 3815.  Troponin I less than 0.30. White count 7.6, hemoglobin 9.7 with an MCV of 83, platelets 310,000. The chest x-ray reveals pulmonary edema with enlargement of the cardio- pericardial silhouette.  Edema changes are increased since his chest x- ray of November 13, 2013.  EKG reveals normal sinus rhythm with left axis deviation, and nonspecific ST-T wave changes, and a prolonged QT interval.  IMPRESSION AND PLAN: 1. Acute on chronic systolic heart failure.  We will treat with IV     Lasix.  Continue carvedilol, lisinopril, and hydralazine. 2. Chronic obstructive pulmonary disease.  He was given albuterol in     the emergency room, but has no wheezing currently.  He has been on     prednisone which will be continued for now. 3. Diabetes.  Continue Lantus.  Treat with sliding scale NovoLog as     needed.  Hold metformin for now. 4. Carcinoma of the prostate. 5. Hypertension.     Paula Compton. Willey Blade, MD     ROF/MEDQ  D:  11/25/2013  T:  11/25/2013  Job:  [id number]

## 2013-11-25 NOTE — Progress Notes (Signed)
NAME:  Oscar Hickman, Oscar Hickman             ACCOUNT NO.:  [id number]  MEDICAL RECORD NO.:  [id number]  LOCATION:  [facility]                         FACILITY:  [facility]  PHYSICIAN:  Paula Compton. Willey Blade, MD       DATE OF BIRTH:  05/12/1941  DATE OF PROCEDURE: DATE OF DISCHARGE:                                PROGRESS NOTE   SUBJECTIVE:  Mr. Human is feeling better.  He is much less short of breath.  His swelling has gone down.  He has diuresed 1900 mL.  OBJECTIVE:  VITAL SIGNS:  Temperature 98.4, pulse 83, respirations 22, blood pressure 134/73, oxygen saturation 94%. LUNGS:  Decreased basilar rales. HEART:  Regular with no murmurs. ABDOMEN:  Soft and nontender. EXTREMITIES:  Lower extremity edema has resolved.  IMPRESSION/PLAN: 1. Acute on chronic systolic heart failure, improved with IV Lasix.     We will decrease his Lasix dose to 40 mg IV every 12 hours. 2. Hypokalemia.  Serum potassium is dropped from 4.1-3.5.  We will     supplement with oral potassium. 3. Normocytic anemia.  Hemoglobin 9.7. 4. Diabetes.  Glucose is 96 this morning. 5. Chronic obstructive pulmonary disease.  Stable.  Continue nebs     p.r.n. 6. Prostate carcinoma.     Paula Compton. Willey Blade, MD     ROF/MEDQ  D:  11/25/2013  T:  11/25/2013  Job:  [id number]

## 2013-11-26 LAB — COMPREHENSIVE METABOLIC PANEL
ALK PHOS: 84 U/L (ref 39–117)
ALT: 9 U/L (ref 0–53)
AST: 8 U/L (ref 0–37)
Albumin: 2.2 g/dL — ABNORMAL LOW (ref 3.5–5.2)
BUN: 19 mg/dL (ref 6–23)
CALCIUM: 8.7 mg/dL (ref 8.4–10.5)
CO2: 34 mEq/L — ABNORMAL HIGH (ref 19–32)
Chloride: 102 mEq/L (ref 96–112)
Creatinine, Ser: 0.86 mg/dL (ref 0.50–1.35)
GFR calc non Af Amer: 85 mL/min — ABNORMAL LOW (ref >90)
GLUCOSE: 89 mg/dL (ref 70–99)
POTASSIUM: 3.8 meq/L (ref 3.7–5.3)
Sodium: 143 mEq/L (ref 137–147)
Total Bilirubin: 0.3 mg/dL (ref 0.3–1.2)
Total Protein: 5.5 g/dL — ABNORMAL LOW (ref 6.0–8.3)

## 2013-11-26 LAB — GLUCOSE, CAPILLARY
GLUCOSE-CAPILLARY: 189 mg/dL — AB (ref 70–99)
GLUCOSE-CAPILLARY: 109 mg/dL — AB (ref 70–99)
GLUCOSE-CAPILLARY: 111 mg/dL — AB (ref 70–99)
Glucose-Capillary: 90 mg/dL (ref 70–99)

## 2013-11-26 NOTE — Progress Notes (Signed)
Utilization Review Complete  

## 2013-11-26 NOTE — Progress Notes (Signed)
Golf ball size hematoma to right anterior forearm noted after lab drew blood. Patient states he does not want the same lab tech to draw his blood and that this has never happened to him before. I assessed the cite and asked if it hurt and he stated it did not. I gave him an ice pack to hold on his arm. Elmo Putt RN

## 2013-11-26 NOTE — Progress Notes (Signed)
Inpatient Diabetes Program Recommendations  AACE/ADA: New Consensus Statement on Inpatient Glycemic Control (2013)  Target Ranges:  Prepandial:   less than 140 mg/dL      Peak postprandial:   less than 180 mg/dL (1-2 hours)      Critically ill patients:  140 - 180 mg/dL   Results for HADRIEL, NORTHUP (MRN [id number]) as of 11/26/2013 10:35  Ref. Range 02/25/2012 09:56  Hemoglobin A1C Latest Range: <5.7 % 5.9 (H)  Results for THURSTON, BRENDLINGER (MRN [id number]) as of 11/26/2013 10:35  Ref. Range 11/25/2013 07:27 11/25/2013 12:06 11/25/2013 17:17 11/25/2013 21:49 11/25/2013 22:31 11/26/2013 07:30  Glucose-Capillary Latest Range: 70-99 mg/dL 96 104 (H) 204 (H) 64 (L) 172 (H) 90    Inpatient Diabetes Program Recommendations Correction (SSI): Please consider decreasing Novolog correction to sensiitve correction scale. HgbA1C: Please consider ordering an A1C to evaluate glcyemic control over the past 2-3 months.  Thanks, Barnie Alderman, RN, MSN, CCRN Diabetes Coordinator Inpatient Diabetes Program [phone] (Team Pager) [phone] ([facility] office) [phone] [facility] office)]

## 2013-11-26 NOTE — Progress Notes (Signed)
[id number] 

## 2013-11-27 LAB — GLUCOSE, CAPILLARY
GLUCOSE-CAPILLARY: 69 mg/dL — AB (ref 70–99)
GLUCOSE-CAPILLARY: 127 mg/dL — AB (ref 70–99)
GLUCOSE-CAPILLARY: 169 mg/dL — AB (ref 70–99)
Glucose-Capillary: 101 mg/dL — ABNORMAL HIGH (ref 70–99)
Glucose-Capillary: 161 mg/dL — ABNORMAL HIGH (ref 70–99)

## 2013-11-27 LAB — BASIC METABOLIC PANEL
BUN: 21 mg/dL (ref 6–23)
CALCIUM: 8.9 mg/dL (ref 8.4–10.5)
CO2: 36 mEq/L — ABNORMAL HIGH (ref 19–32)
CREATININE: 0.99 mg/dL (ref 0.50–1.35)
Chloride: 101 mEq/L (ref 96–112)
GFR calc Af Amer: >90 mL/min (ref >90)
GFR calc non Af Amer: 80 mL/min — ABNORMAL LOW (ref >90)
GLUCOSE: 71 mg/dL (ref 70–99)
Potassium: 4.4 mEq/L (ref 3.7–5.3)
SODIUM: 142 meq/L (ref 137–147)

## 2013-11-27 LAB — PRO B NATRIURETIC PEPTIDE: Pro B Natriuretic peptide (BNP): 1323 pg/mL — ABNORMAL HIGH (ref 0–125)

## 2013-11-27 MED ORDER — INSULIN GLARGINE 100 UNIT/ML ~~LOC~~ SOLN
15.0000 [IU] | Freq: Every day | SUBCUTANEOUS | Status: DC
  Administered 2013-11-27 – 2013-11-29 (×3): 15 [IU] via SUBCUTANEOUS
  Filled 2013-11-27 (×4): qty 0.15

## 2013-11-27 MED ORDER — ALBUTEROL SULFATE (2.5 MG/3ML) 0.083% IN NEBU: 2.5000 mg | INHALATION_SOLUTION | RESPIRATORY_TRACT | Status: DC | PRN

## 2013-11-27 MED ORDER — IPRATROPIUM-ALBUTEROL 0.5-2.5 (3) MG/3ML IN SOLN
3.0000 mL | Freq: Two times a day (BID) | RESPIRATORY_TRACT | Status: DC
  Administered 2013-11-27 – 2013-11-29 (×4): 3 mL via RESPIRATORY_TRACT
  Filled 2013-11-27 (×4): qty 3

## 2013-11-27 MED ORDER — GLUCOSE 40 % PO GEL
ORAL | Status: AC
  Administered 2013-11-27: 37.5 g
  Filled 2013-11-27: qty 1

## 2013-11-27 MED ORDER — INSULIN GLARGINE 100 UNIT/ML ~~LOC~~ SOLN
15.0000 [IU] | Freq: Every day | SUBCUTANEOUS | Status: DC
  Filled 2013-11-27: qty 0.15

## 2013-11-27 NOTE — Progress Notes (Signed)
CRITICAL VALUE ALERT  Critical value received:  32  Date of notification:  11/27/2013  Time of notification:  0800 am  Critical value read back:yes  Nurse who received alert:  Radene Gunning LPN  MD notified (1st page):  Dr Cindie Laroche  Time of first page:  0815  MD notified (2nd page):  Time of second page:  Responding MD:  Dr Cindie Laroche  Time MD responded:  [phone]

## 2013-11-27 NOTE — Progress Notes (Signed)
[id number] 

## 2013-11-27 NOTE — Progress Notes (Signed)
NAME:  Oscar Hickman, Oscar Hickman             ACCOUNT NO.:  [id number]  MEDICAL RECORD NO.:  [id number]  LOCATION:  [facility]                         FACILITY:  [facility]  PHYSICIAN:  Unk Lightning, MDDATE OF BIRTH:  1941-07-12  DATE OF PROCEDURE: DATE OF DISCHARGE:                                PROGRESS NOTE   The patient has chronic ischemic cardiomyopathy, mostly nonobstructive, ejection fraction 45%, some congestive heart failure, status post bronchospasm a week or 2 ago.  No evidence of wheezing at present.  Some dyspnea and elevated proBNP in 3400 range.  Insulin-dependent diabetes, hypertension, prostate carcinoma, chronic noncompliance, diminished cognitive functioning.  Blood pressure 121/76, temperature 97.7, pulse 76 and regular, respiratory rate 20.  LUNGS:  Diminished breath sounds at the bases.  No rales, wheeze, or rhonchi appreciable. HEART:  Regular.  No S3, S4.  No heaves, thrills, or rubs.  Creatinine 0.86.  Chest x-ray, 48 hours ago reveals interstitial edema. No pleural effusion.  Echocardiogram done in July 2014 reveals EF of 45% with posterior hypokinesis.  The patient denies anginal pain.  IMPRESSION: 1. Ischemic cardiomyopathy. 2. Hypertension. 3. Hyperlipidemia. 4. Insulin-dependent diabetes. 5. Anxiety. 6. Chronic obstructive pulmonary disease. 7. Peripheral neuropathy. 8. Carcinoma of the prostate status post prostatectomy with radiation     therapy, now on chemotherapy. 9. Cognitive dysfunction.  PLAN:  Right now is to continue on Lantus 25 units daily and given in a.m.  I supplied lisinopril, metformin, amlodipine 10, carvedilol 25 p.o. b.i.d., and aspirin 81.  We will check BNP and BMET in a.m., and continue nebulizer therapy.    Unk Lightning, MD    RMD/MEDQ  D:  11/26/2013  T:  11/27/2013  Job:  [id number]

## 2013-11-27 NOTE — Care Management Note (Addendum)
    Page 1 of 1   11/28/2013     2:41:24 PM   CARE MANAGEMENT NOTE 11/28/2013  Patient:  Oscar Hickman, Oscar Hickman   Account Number:  [id number]  Date Initiated:  11/27/2013  Documentation initiated by:  Claretha Cooper  Subjective/Objective Assessment:   Pt admitted from home where he lives iwth his wife. Pt states he has a great deal of family around at all times. Pt also would like to have [place] RN with [facility]     Action/Plan:   Anticipated DC Date:  11/29/2013   Anticipated DC Plan:  [place]  CM consult      Choice offered to / List presented to:          [facility] arranged  HH-1 RN      [place].   Status of service:  Completed, signed off Medicare Important Message given?   (If response is "NO", the following Medicare IM given date fields will be blank) Date Medicare IM given:   Date Additional Medicare IM given:    Discharge Disposition:  [place]  Per UR Regulation:    If discussed at Long Length of Stay Meetings, dates discussed:    Comments:  11/28/13 Claretha Cooper RN BSN CM CM spoke to Dr. Cindie Laroche regarding progression to DC. Per MD, plan to DC tomorrow. Concern for medication compliance. CM will alert [facility] RN to specificly review for compliance.  11/27/13 Claretha Cooper RN BSN CM

## 2013-11-28 LAB — BASIC METABOLIC PANEL
BUN: 24 mg/dL — ABNORMAL HIGH (ref 6–23)
CALCIUM: 9.1 mg/dL (ref 8.4–10.5)
CO2: 34 mEq/L — ABNORMAL HIGH (ref 19–32)
Chloride: 99 mEq/L (ref 96–112)
Creatinine, Ser: 1.1 mg/dL (ref 0.50–1.35)
GFR calc Af Amer: 76 mL/min — ABNORMAL LOW (ref >90)
GFR, EST NON AFRICAN AMERICAN: 66 mL/min — AB (ref >90)
Glucose, Bld: 80 mg/dL (ref 70–99)
Potassium: 4.2 mEq/L (ref 3.7–5.3)
SODIUM: 141 meq/L (ref 137–147)

## 2013-11-28 LAB — GLUCOSE, CAPILLARY
GLUCOSE-CAPILLARY: 161 mg/dL — AB (ref 70–99)
GLUCOSE-CAPILLARY: 142 mg/dL — AB (ref 70–99)
Glucose-Capillary: 79 mg/dL (ref 70–99)
Glucose-Capillary: 129 mg/dL — ABNORMAL HIGH (ref 70–99)

## 2013-11-28 LAB — MAGNESIUM: MAGNESIUM: 2.1 mg/dL (ref 1.5–2.5)

## 2013-11-28 NOTE — Progress Notes (Signed)
[id number] 

## 2013-11-28 NOTE — Progress Notes (Signed)
NAME:  Oscar Hickman, Oscar Hickman             ACCOUNT NO.:  [id number]  MEDICAL RECORD NO.:  [id number]  LOCATION:  [facility]                         [facility]ILITY[facility]  [facility]  PHYSICIAN:  Unk Lightning, MDDATE OF BIRTH:  [birth year]  DATE OF PROCEDURE: DATE OF DISCHARGE:                                PROGRESS NOTE   The patient has ischemic cardiomyopathy, ejection fraction 45-50%; insulin-dependent diabetes; cognitive dysfunction; question of compliance, admitted with volume overload and diastolic dysfunction, congestive heart failure.  He likewise has concomitant anemia, hemoglobin 9.7 with a creatinine of 0.99, hypertension and hyperlipidemia.  Blood pressure today is much improved, 126/66, temperature 97.9, pulse 73 and sinus rhythm, respiratory rate is 18.  Lungs show diminished breath sounds at the bases.  Mild end-expiratory wheeze, scattered rhonchi, no rales appreciable.  Heart:  Regular rhythm.  No S3, S4.  No heaves, thrills, or rubs.  Extremities show 1+ pedal edema.  The patient has fair glycemic control, insulin reduced, Lantus reduced to 25-15 this morning, had an episode of hypoglycemia in the fasting stage.  ProBNP was 1323, today.  Currently undergoing diuresis.  Anemia profile pending.  The patient currently controlled on hydralazine 50 p.o. t.i.d., clonidine 0.2 p.o. b.i.d., Coreg 25 p.o. b.i.d., Norvasc 10 mg p.o. daily, Flomax 0.4 daily as well as aspirin.  Plan right now is to ambulate the patient for strengthening and monitor renal function.  Monitor I's and O's.  Continue Lasix 40 IV b.i.d., and make further recommendations as the database expands.     Unk Lightning, MD     RMD/MEDQ  D:  11/27/2013  T:  11/28/2013  Job:  [id number]

## 2013-11-29 LAB — BASIC METABOLIC PANEL
BUN: 22 mg/dL (ref 6–23)
CO2: 33 mEq/L — ABNORMAL HIGH (ref 19–32)
CREATININE: 1.07 mg/dL (ref 0.50–1.35)
Calcium: 8.7 mg/dL (ref 8.4–10.5)
Chloride: 102 mEq/L (ref 96–112)
GFR calc Af Amer: 79 mL/min — ABNORMAL LOW (ref >90)
GFR, EST NON AFRICAN AMERICAN: 68 mL/min — AB (ref >90)
Glucose, Bld: 109 mg/dL — ABNORMAL HIGH (ref 70–99)
Potassium: 3.8 mEq/L (ref 3.7–5.3)
Sodium: 142 mEq/L (ref 137–147)

## 2013-11-29 LAB — GLUCOSE, CAPILLARY
GLUCOSE-CAPILLARY: 91 mg/dL (ref 70–99)
GLUCOSE-CAPILLARY: 126 mg/dL — AB (ref 70–99)
Glucose-Capillary: 134 mg/dL — ABNORMAL HIGH (ref 70–99)

## 2013-11-29 MED ORDER — FUROSEMIDE 20 MG PO TABS
20.0000 mg | ORAL_TABLET | Freq: Two times a day (BID) | ORAL | Status: DC
Start: 2013-11-29 — End: 2014-05-23

## 2013-11-29 MED ORDER — ALBUTEROL SULFATE (2.5 MG/3ML) 0.083% IN NEBU: 2.5000 mg | INHALATION_SOLUTION | RESPIRATORY_TRACT | Status: AC | PRN

## 2013-11-29 NOTE — Progress Notes (Signed)
Pt verbalizes understanding of d/c instructions & medications. IV d/c without complications. Pt has no questions at this time. pts grandson at bedside and verbalizes d/c instructions. D/c paperwork and prescriptions in hand at time of d/c. Pt d/c via wheelchair, accompanied by NT and pts family. Marry Guan

## 2013-11-29 NOTE — Progress Notes (Signed)
NAME:  Oscar Hickman             ACCOUNT NO.:  [id number]  MEDICAL RECORD NO.:  [id number]  LOCATION:  [facility]                         [facility]ACILITY[facility]  [facility]  PHYSICIAN:  Unk Lightning, MDDATE OF BIRTH:  1941/08/14  DATE OF PROCEDURE: DATE OF DISCHARGE:                                PROGRESS NOTE   The patient was admitted with acute systolic congestive heart failure due to ischemic cardiomyopathy, ejection fraction of 45-50% with posterior akinesis, ACS ruled out on the basis of enzymes.  The patient is volume overload, has been given vigorous diuresis, responded appropriately.  He likewise has insulin-dependent diabetes, hypertension, carcinoma of prostate as well as a bladder outlet obstruction; all well controlled.  Good glycemic control.  The patient has no significant dyspnea, eating well, comfortable.  No orthopnea or PND.  No anginal chest pain.  PHYSICAL EXAMINATION:  VITAL SIGNS:  Blood pressure is 134/83, temperature 97.2, pulse 76 and regular, respiratory rate is 18. LUNGS:  Show diminished breath sounds at bases.  No rales, wheeze, or rhonchi appreciable. HEART:  Regular rhythm.  No S3, S4.  No heaves, thrills, or rubs. ABDOMEN:  Soft, nontender.  Bowel sounds normoactive.  LABORATORY DATA:  Creatinine is 1.1.  Electrolytes within normal limits.  PLAN:  Right now is continue current therapy.  Switch IV Lasix to p.o. 40 q.12 or b.i.d. and consider discharge in a.m.     Unk Lightning, MD     RMD/MEDQ  D:  11/28/2013  T:  11/29/2013  Job:  [id number]

## 2013-11-29 NOTE — Discharge Summary (Signed)
[id number] 

## 2013-11-30 NOTE — Discharge Summary (Signed)
NAME:  Oscar Hickman, Oscar Hickman NO.:  [id number]  MEDICAL RECORD NO.:  [id number]  LOCATION:  [facility]                         FACILITY:  [facility]  PHYSICIAN:  Unk Lightning, MDDATE OF BIRTH:  [birth year]  DATE OF ADMISSION:  11/24/2013 DATE OF DISCHARGE:  LH                              DISCHARGE SUMMARY   HOSPITAL COURSE:  A 72 year old black male with known prostate carcinoma status post radiation therapy, hypertension, ischemic cardiomyopathy, ejection fraction 45-50% with posterior wall hypokinesis, some bronchospasm with insulin-dependent diabetes, and cognitive dysfunction which is chronic and question of compliance to medicines.  The patient was admitted with acute on chronic systolic congestive heart failure, given aggressive nebulizer therapy, diuresis.  He was admitted with accelerated hypertension, systolic in the 322G presumably due to noncompliance.  He was placed back on his medical regimen and his blood pressure came down to 254Y systolic with resolution of CHF over 4-5 days.  He had good pulmonary toilet with nebulizer therapy, diuresis and was clear of any significant dyspnea for the last 72 hours in the hospital.  His cardiac enzymes were negative.  His renal function was within normal parameters as were his electrolytes.  He was subsequently discharged on the following medicines; Zytiga 250 mg tablets for carcinoma of the prostate 4 tablets daily, Proventil nebulizer 2.5 mg q.i.d., Norvasc 10 mg p.o. daily, aspirin 81 mg p.o. daily, Coreg 25 mg p.o. b.i.d., Catapres 0.2 mg p.o. b.i.d., Lasix 20 mg p.o. b.i.d., hydralazine 50 mg p.o. t.i.d., Lantus 25 units subcu at bedtime, Isordil 20 mg p.o. t.i.d., lisinopril 40 mg p.o. daily, Glucophage 500 mg p.o. b.i.d., prednisone 5 mg p.o. daily, and Flomax 0.4 mg p.o. daily.  The patient is cautioned to follow up my office in 3-7 days' time for assessment of renal function, electrolytes, potassium as well  as magnesium and he understands this.  He is going home with nebulizer therapy.  Home health nurse to ensure compliance.     Unk Lightning, MD     RMD/MEDQ  D:  11/29/2013  T:  11/30/2013  Job:  [id number]

## 2013-12-18 ENCOUNTER — Other Ambulatory Visit (HOSPITAL_COMMUNITY): Payer: Self-pay | Admitting: Oncology

## 2013-12-18 DIAGNOSIS — C61 Malignant neoplasm of prostate: Secondary | ICD-10-CM

## 2013-12-18 MED ORDER — ABIRATERONE ACETATE 250 MG PO TABS: 1000.0000 mg | ORAL_TABLET | Freq: Every day | ORAL | Status: DC

## 2013-12-20 ENCOUNTER — Other Ambulatory Visit (HOSPITAL_COMMUNITY): Payer: Self-pay | Admitting: Oncology

## 2013-12-20 DIAGNOSIS — C61 Malignant neoplasm of prostate: Secondary | ICD-10-CM

## 2013-12-20 MED ORDER — PREDNISONE 5 MG PO TABS: 5.0000 mg | ORAL_TABLET | Freq: Two times a day (BID) | ORAL | Status: DC

## 2014-01-11 ENCOUNTER — Other Ambulatory Visit (HOSPITAL_COMMUNITY): Payer: Medicare Other

## 2014-01-11 ENCOUNTER — Ambulatory Visit (HOSPITAL_COMMUNITY): Payer: Medicare Other

## 2014-01-12 NOTE — Progress Notes (Signed)
This encounter was created in error - please disregard.

## 2014-01-21 ENCOUNTER — Encounter (HOSPITAL_COMMUNITY): Payer: Medicare Other | Attending: Hematology and Oncology

## 2014-01-21 ENCOUNTER — Encounter (HOSPITAL_COMMUNITY): Payer: Self-pay

## 2014-01-21 VITALS — BP 146/81 | HR 77 | Temp 97.9°F | Resp 20 | Wt 190.5 lb

## 2014-01-21 DIAGNOSIS — C7952 Secondary malignant neoplasm of bone marrow: Secondary | ICD-10-CM

## 2014-01-21 DIAGNOSIS — Z5111 Encounter for antineoplastic chemotherapy: Secondary | ICD-10-CM

## 2014-01-21 DIAGNOSIS — I10 Essential (primary) hypertension: Secondary | ICD-10-CM

## 2014-01-21 DIAGNOSIS — C61 Malignant neoplasm of prostate: Secondary | ICD-10-CM

## 2014-01-21 DIAGNOSIS — E119 Type 2 diabetes mellitus without complications: Secondary | ICD-10-CM

## 2014-01-21 DIAGNOSIS — C7951 Secondary malignant neoplasm of bone: Secondary | ICD-10-CM

## 2014-01-21 LAB — CBC WITH DIFFERENTIAL/PLATELET
Basophils Absolute: 0 10*3/uL (ref 0.0–0.1)
Basophils Relative: 1 % (ref 0–1)
EOS ABS: 0.2 10*3/uL (ref 0.0–0.7)
Eosinophils Relative: 3 % (ref 0–5)
HCT: 33.3 % — ABNORMAL LOW (ref 39.0–52.0)
HEMOGLOBIN: 10.5 g/dL — AB (ref 13.0–17.0)
LYMPHS ABS: 0.8 10*3/uL (ref 0.7–4.0)
LYMPHS PCT: 14 % (ref 12–46)
MCH: 26.1 pg (ref 26.0–34.0)
MCHC: 31.5 g/dL (ref 30.0–36.0)
MCV: 82.8 fL (ref 78.0–100.0)
MONOS PCT: 10 % (ref 3–12)
Monocytes Absolute: 0.6 10*3/uL (ref 0.1–1.0)
NEUTROS PCT: 73 % (ref 43–77)
Neutro Abs: 4.2 10*3/uL (ref 1.7–7.7)
PLATELETS: 312 10*3/uL (ref 150–400)
RBC: 4.02 MIL/uL — AB (ref 4.22–5.81)
RDW: 17.9 % — ABNORMAL HIGH (ref 11.5–15.5)
WBC: 5.7 10*3/uL (ref 4.0–10.5)

## 2014-01-21 LAB — COMPREHENSIVE METABOLIC PANEL
ALT: 9 U/L (ref 0–53)
AST: 13 U/L (ref 0–37)
Albumin: 3.4 g/dL — ABNORMAL LOW (ref 3.5–5.2)
Alkaline Phosphatase: 98 U/L (ref 39–117)
BILIRUBIN TOTAL: 0.4 mg/dL (ref 0.3–1.2)
BUN: 18 mg/dL (ref 6–23)
CHLORIDE: 101 meq/L (ref 96–112)
CO2: 27 meq/L (ref 19–32)
Calcium: 9.3 mg/dL (ref 8.4–10.5)
Creatinine, Ser: 0.77 mg/dL (ref 0.50–1.35)
GFR, EST NON AFRICAN AMERICAN: 89 mL/min — AB (ref >90)
Glucose, Bld: 208 mg/dL — ABNORMAL HIGH (ref 70–99)
POTASSIUM: 3.5 meq/L — AB (ref 3.7–5.3)
SODIUM: 140 meq/L (ref 137–147)
TOTAL PROTEIN: 7 g/dL (ref 6.0–8.3)

## 2014-01-21 MED ORDER — LEUPROLIDE ACETATE (3 MONTH) 22.5 MG IM KIT
22.5000 mg | PACK | Freq: Once | INTRAMUSCULAR | Status: AC
  Administered 2014-01-21: 22.5 mg via INTRAMUSCULAR
  Filled 2014-01-21: qty 22.5

## 2014-01-21 NOTE — Progress Notes (Signed)
[facility]  OFFICE PROGRESS NOTE  [name], MD [place] [address]  DIAGNOSIS: Prostate cancer - Plan: CANCELED: CBC with Differential  Chief Complaint  Patient presents with  . 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 Follow-up    Bone scan negative, June 2013  . Penile urethral recurrence.    CURRENT THERAPY: Zytiga/prednisone daily started 07/06/2012 and Depot- Lupron 22.5 mg every 12 weeks.  INTERVAL HISTORY: Oscar Hickman 73 y.o. 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  He is interview today aside his wife. Appetite is good according to her with no nausea, vomiting, diarrhea, or constipation. Patient does have urinary incontinence for which he wears Depends. He denies any hematuria, melena, hematochezia, breast pain, hot flashes, lower extremity swelling or redness, PND, orthopnea, palpitations, skin rash, headache,  or seizures. He does take zytiga on an empty stomach.  MEDICAL HISTORY: Past Medical History  Diagnosis Date  . Skin infection     history  . HTN (hypertension) 11/23/2011  . High cholesterol   . Diabetes mellitus   . Chronic headache     daily  . Anxiety   . Osteoarthritis   . COPD (chronic obstructive pulmonary disease)     patient denies  . Peripheral neuropathy   . 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 CHF (congestive heart failure)   . Medical non-compliance   . Heart disease   . Cognitive dysfunction     INTERIM HISTORY: has ADENOCARCINOMA, PROSTATE; DIABETES MELLITUS, TYPE II; HYPERLIPIDEMIA; HTN (hypertension); Cardiomyopathy, nonischemic; Pyelonephritis; Hydronephrosis of right kidney; Acute on chronic systolic CHF (congestive heart  failure); Acute CHF; and CHF (congestive heart failure) on his problem list.   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 Indwelling Foley catheter removed. S/P Depo-Lupro and Casodex. 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 S/P Depo-Lupon 22.5 mg every 12 weeks and Casodex daily with failure in therapy which lead to a discontinuation of this therapy to switched to Zytiga/Prednisone starting on 07/06/2012. Depo-Lupron was appropriately restarted on 04/19/2013.  ALLERGIES:  has No Known Allergies.  MEDICATIONS: has a current medication list which includes the following prescription(s): abiraterone acetate, albuterol, amlodipine, aspirin ec, carvedilol, clonidine, furosemide, hydralazine, isosorbide dinitrate, lisinopril, metformin, prednisone, tamsulosin, and insulin glargine.  SURGICAL HISTORY:  Past Surgical History  Procedure Laterality Date  . Transurethral resection of prostate  03/2008  . Cardiac catheterization  02/2009    minimal non-obstructive CAD  . Cystoscopy w/ ureteral stent placement Right 04/06/2013    Procedure: CYSTOSCOPY WITH RETROGRADE PYELOGRAM/URETERAL STENT PLACEMENT;  Surgeon: Marissa Nestle, MD;  Location: [facility];  Service: Urology;  Laterality: Right;  . Cystoscopy w/ ureteral stent removal Right 04/06/2013    Procedure: CYSTOSCOPY WITH STENT REMOVAL;  Surgeon: Marissa Nestle, MD;  Location: [facility];  Service: Urology;  Laterality: Right;    FAMILY HISTORY: family history includes Cancer in his father and mother.  SOCIAL HISTORY:  reports that he has never smoked. He has never used smokeless tobacco. He reports that he does not drink alcohol or use illicit drugs.  REVIEW OF SYSTEMS:  Other  than that discussed above is noncontributory.  PHYSICAL EXAMINATION: ECOG PERFORMANCE STATUS: 1 - Symptomatic but completely  ambulatory  Blood pressure 146/81, pulse 77, temperature 97.9 F (36.6 C), temperature source Oral, resp. rate 20, weight 190 lb 8 oz (86.41 kg).  GENERAL:alert, no distress and comfortable SKIN: skin color, texture, turgor are normal, no rashes or significant lesions EYES: PERLA; Conjunctiva are pink and non-injected, sclera clear OROPHARYNX:no exudate, no erythema on lips, buccal mucosa, or tongue. NECK: supple, thyroid normal size, non-tender, without nodularity. No masses CHEST: Normal AP diameter with bilateral gynecomastia. LYMPH:  no palpable lymphadenopathy in the cervical, axillary or inguinal LUNGS: clear to auscultation and percussion with normal breathing effort HEART: regular rate & rhythm and no murmurs. ABDOMEN:abdomen soft, non-tender and normal bowel sounds MUSCULOSKELETAL:no cyanosis of digits and no clubbing. Range of motion normal.  NEURO: alert & oriented x 3 with fluent speech, no focal motor/sensory deficits   LABORATORY DATA:  Results for MARTINO, TOMPSON (MRN [id number]) as of 01/21/2014 13:28  Ref. Range 11/16/2012 15:25 01/17/2013 11:44 04/19/2013 10:15 07/19/2013 11:37 10/19/2013 11:11  PSA Latest Range: <=4.00 ng/mL 3.37 2.37 0.27 0.17 0.29  Results for CHUN, SELLEN (MRN [id number]) as of 01/21/2014 13:28  Ref. Range 10/19/2013 11:11  PSA Latest Range: <=4.00 ng/mL 0.29   Results for OLYN, LANDSTROM (MRN [id number]) as of 01/21/2014 13:28  Ref. Range 10/19/2013 11:11  Testosterone Latest Range: 300-890 ng/dL <10 (L)    No visits with results within 30 Day(s) from this visit. Latest known visit with results is:  Admission on 11/24/2013, Discharged on 11/29/2013  Component Date Value Ref Range Status  . WBC 11/24/2013 7.6  4.0 - 10.5 K/uL Final  . RBC 11/24/2013 3.59* 4.22 - 5.81 MIL/uL Final  . Hemoglobin 11/24/2013 9.7* 13.0 - 17.0 g/dL Final  . HCT 11/24/2013 29.9* 39.0 - 52.0 % Final  . MCV 11/24/2013 83.3  78.0 - 100.0 fL Final  . MCH  11/24/2013 27.0  26.0 - 34.0 pg Final  . MCHC 11/24/2013 32.4  30.0 - 36.0 g/dL Final  . RDW 11/24/2013 17.5* 11.5 - 15.5 % Final  . Platelets 11/24/2013 310  150 - 400 K/uL Final  . Neutrophils Relative % 11/24/2013 80* 43 - 77 % Final  . Neutro Abs 11/24/2013 6.1  1.7 - 7.7 K/uL Final  . Lymphocytes Relative 11/24/2013 10* 12 - 46 % Final  . Lymphs Abs 11/24/2013 0.8  0.7 - 4.0 K/uL Final  . Monocytes Relative 11/24/2013 8  3 - 12 % Final  . Monocytes Absolute 11/24/2013 0.6  0.1 - 1.0 K/uL Final  . Eosinophils Relative 11/24/2013 1  0 - 5 % Final  . Eosinophils Absolute 11/24/2013 0.1  0.0 - 0.7 K/uL Final  . Basophils Relative 11/24/2013 0  0 - 1 % Final  . Basophils Absolute 11/24/2013 0.0  0.0 - 0.1 K/uL Final  . Sodium 11/24/2013 141  137 - 147 mEq/L Final  . Potassium 11/24/2013 4.1  3.7 - 5.3 mEq/L Final  . Chloride 11/24/2013 104  96 - 112 mEq/L Final  . CO2 11/24/2013 29  19 - 32 mEq/L Final  . Glucose, Bld 11/24/2013 145* 70 - 99 mg/dL Final  . BUN 11/24/2013 20  6 - 23 mg/dL Final  . Creatinine, Ser 11/24/2013 0.78  0.50 - 1.35 mg/dL Final  . Calcium 11/24/2013 8.7  8.4 - 10.5 mg/dL Final  . GFR calc non Af Amer 11/24/2013 89* >90 mL/min Final  . GFR  calc Af Amer 11/24/2013 >90  >90 mL/min Final   Comment: (NOTE)                          The eGFR has been calculated using the CKD EPI equation.                          This calculation has not been validated in all clinical situations.                          eGFR's persistently <90 mL/min signify possible Chronic Kidney                          Disease.  . Troponin I 11/24/2013 <0.30  <0.30 ng/mL Final   Comment:                                 Due to the release kinetics of cTnI,                          a negative result within the first hours                          of the onset of symptoms does not rule out                          myocardial infarction with certainty.                          If myocardial  infarction is still suspected,                          repeat the test at appropriate intervals.  . Pro B Natriuretic peptide (BNP) 11/24/2013 3815.0* 0 - 125 pg/mL Final  . Sodium 11/25/2013 143  137 - 147 mEq/L Final  . Potassium 11/25/2013 3.5* 3.7 - 5.3 mEq/L Final  . Chloride 11/25/2013 102  96 - 112 mEq/L Final  . CO2 11/25/2013 34* 19 - 32 mEq/L Final  . Glucose, Bld 11/25/2013 98  70 - 99 mg/dL Final  . BUN 11/25/2013 17  6 - 23 mg/dL Final  . Creatinine, Ser 11/25/2013 0.87  0.50 - 1.35 mg/dL Final  . Calcium 11/25/2013 8.6  8.4 - 10.5 mg/dL Final  . GFR calc non Af Amer 11/25/2013 85* >90 mL/min Final  . GFR calc Af Amer 11/25/2013 >90  >90 mL/min Final   Comment: (NOTE)                          The eGFR has been calculated using the CKD EPI equation.                          This calculation has not been validated in all clinical situations.                          eGFR's persistently <90 mL/min signify possible Chronic Kidney  Disease.  . Glucose-Capillary 11/24/2013 114* 70 - 99 mg/dL Final  . Comment 1 11/24/2013 Notify RN   Final  . Glucose-Capillary 11/24/2013 200* 70 - 99 mg/dL Final  . Comment 1 11/24/2013 Notify RN   Final  . Glucose-Capillary 11/25/2013 96  70 - 99 mg/dL Final  . Comment 1 11/25/2013 Notify RN   Final  . Glucose-Capillary 11/25/2013 104* 70 - 99 mg/dL Final  . Comment 1 11/25/2013 Notify RN   Final  . Sodium 11/26/2013 143  137 - 147 mEq/L Final  . Potassium 11/26/2013 3.8  3.7 - 5.3 mEq/L Final  . Chloride 11/26/2013 102  96 - 112 mEq/L Final  . CO2 11/26/2013 34* 19 - 32 mEq/L Final  . Glucose, Bld 11/26/2013 89  70 - 99 mg/dL Final  . BUN 11/26/2013 19  6 - 23 mg/dL Final  . Creatinine, Ser 11/26/2013 0.86  0.50 - 1.35 mg/dL Final  . Calcium 11/26/2013 8.7  8.4 - 10.5 mg/dL Final  . Total Protein 11/26/2013 5.5* 6.0 - 8.3 g/dL Final  . Albumin 11/26/2013 2.2* 3.5 - 5.2 g/dL Final  . AST 11/26/2013 8  0 - 37 U/L  Final  . ALT 11/26/2013 9  0 - 53 U/L Final  . Alkaline Phosphatase 11/26/2013 84  39 - 117 U/L Final  . Total Bilirubin 11/26/2013 0.3  0.3 - 1.2 mg/dL Final  . GFR calc non Af Amer 11/26/2013 85* >90 mL/min Final  . GFR calc Af Amer 11/26/2013 >90  >90 mL/min Final   Comment: (NOTE)                          The eGFR has been calculated using the CKD EPI equation.                          This calculation has not been validated in all clinical situations.                          eGFR's persistently <90 mL/min signify possible Chronic Kidney                          Disease.  . Glucose-Capillary 11/25/2013 204* 70 - 99 mg/dL Final  . Comment 1 11/25/2013 Notify RN   Final  . Glucose-Capillary 11/25/2013 64* 70 - 99 mg/dL Final  . Comment 1 11/25/2013 Notify RN   Final  . Comment 2 11/25/2013 Documented in Chart   Final  . Glucose-Capillary 11/25/2013 172* 70 - 99 mg/dL Final  . Comment 1 11/25/2013 Notify RN   Final  . Comment 2 11/25/2013 Documented in Chart   Final  . Glucose-Capillary 11/26/2013 90  70 - 99 mg/dL Final  . Comment 1 11/26/2013 Documented in Chart   Final  . Comment 2 11/26/2013 Notify RN   Final  . Glucose-Capillary 11/26/2013 189* 70 - 99 mg/dL Final  . Comment 1 11/26/2013 Documented in Chart   Final  . Comment 2 11/26/2013 Notify RN   Final  . Sodium 11/27/2013 142  137 - 147 mEq/L Final  . Potassium 11/27/2013 4.4  3.7 - 5.3 mEq/L Final  . Chloride 11/27/2013 101  96 - 112 mEq/L Final  . CO2 11/27/2013 36* 19 - 32 mEq/L Final  . Glucose, Bld 11/27/2013 71  70 - 99 mg/dL Final  . BUN 11/27/2013  21  6 - 23 mg/dL Final  . Creatinine, Ser 11/27/2013 0.99  0.50 - 1.35 mg/dL Final  . Calcium 11/27/2013 8.9  8.4 - 10.5 mg/dL Final  . GFR calc non Af Amer 11/27/2013 80* >90 mL/min Final  . GFR calc Af Amer 11/27/2013 >90  >90 mL/min Final   Comment: (NOTE)                          The eGFR has been calculated using the CKD EPI equation.                           This calculation has not been validated in all clinical situations.                          eGFR's persistently <90 mL/min signify possible Chronic Kidney                          Disease.  . Pro B Natriuretic peptide (BNP) 11/27/2013 1323.0* 0 - 125 pg/mL Final  . Glucose-Capillary 11/26/2013 109* 70 - 99 mg/dL Final  . Comment 1 11/26/2013 Documented in Chart   Final  . Comment 2 11/26/2013 Notify RN   Final  . Glucose-Capillary 11/26/2013 111* 70 - 99 mg/dL Final  . Glucose-Capillary 11/27/2013 69* 70 - 99 mg/dL Final  . Comment 1 11/27/2013 Notify RN   Final  . Glucose-Capillary 11/27/2013 127* 70 - 99 mg/dL Final  . Glucose-Capillary 11/27/2013 169* 70 - 99 mg/dL Final  . Glucose-Capillary 11/27/2013 101* 70 - 99 mg/dL Final  . Sodium 11/28/2013 141  137 - 147 mEq/L Final  . Potassium 11/28/2013 4.2  3.7 - 5.3 mEq/L Final  . Chloride 11/28/2013 99  96 - 112 mEq/L Final  . CO2 11/28/2013 34* 19 - 32 mEq/L Final  . Glucose, Bld 11/28/2013 80  70 - 99 mg/dL Final  . BUN 11/28/2013 24* 6 - 23 mg/dL Final  . Creatinine, Ser 11/28/2013 1.10  0.50 - 1.35 mg/dL Final  . Calcium 11/28/2013 9.1  8.4 - 10.5 mg/dL Final  . GFR calc non Af Amer 11/28/2013 66* >90 mL/min Final  . GFR calc Af Amer 11/28/2013 76* >90 mL/min Final   Comment: (NOTE)                          The eGFR has been calculated using the CKD EPI equation.                          This calculation has not been validated in all clinical situations.                          eGFR's persistently <90 mL/min signify possible Chronic Kidney                          Disease.  . Glucose-Capillary 11/27/2013 161* 70 - 99 mg/dL Final  . Comment 1 11/27/2013 Documented in Chart   Final  . Comment 2 11/27/2013 Notify RN   Final  . Glucose-Capillary 11/28/2013 79  70 - 99 mg/dL Final  . Comment 1 11/28/2013 Notify RN   Final  . Comment 2 11/28/2013 Documented in Chart   Final  .  Glucose-Capillary 11/28/2013 129* 70 - 99 mg/dL Final    . Comment 1 11/28/2013 Notify RN   Final  . Comment 2 11/28/2013 Documented in Chart   Final  . Magnesium 11/28/2013 2.1  1.5 - 2.5 mg/dL Final  . Glucose-Capillary 11/28/2013 161* 70 - 99 mg/dL Final  . Comment 1 11/28/2013 Notify RN   Final  . Comment 2 11/28/2013 Documented in Chart   Final  . Sodium 11/29/2013 142  137 - 147 mEq/L Final  . Potassium 11/29/2013 3.8  3.7 - 5.3 mEq/L Final  . Chloride 11/29/2013 102  96 - 112 mEq/L Final  . CO2 11/29/2013 33* 19 - 32 mEq/L Final  . Glucose, Bld 11/29/2013 109* 70 - 99 mg/dL Final  . BUN 11/29/2013 22  6 - 23 mg/dL Final  . Creatinine, Ser 11/29/2013 1.07  0.50 - 1.35 mg/dL Final  . Calcium 11/29/2013 8.7  8.4 - 10.5 mg/dL Final  . GFR calc non Af Amer 11/29/2013 68* >90 mL/min Final  . GFR calc Af Amer 11/29/2013 79* >90 mL/min Final   Comment: (NOTE)                          The eGFR has been calculated using the CKD EPI equation.                          This calculation has not been validated in all clinical situations.                          eGFR's persistently <90 mL/min signify possible Chronic Kidney                          Disease.  . Glucose-Capillary 11/28/2013 142* 70 - 99 mg/dL Final  . Glucose-Capillary 11/29/2013 91  70 - 99 mg/dL Final  . Comment 1 11/29/2013 Notify RN   Final  . Comment 2 11/29/2013 Documented in Chart   Final  . Glucose-Capillary 11/29/2013 134* 70 - 99 mg/dL Final  . Comment 1 11/29/2013 Notify RN   Final  . Comment 2 11/29/2013 Documented in Chart   Final  . Glucose-Capillary 11/29/2013 126* 70 - 99 mg/dL Final  . Comment 1 11/29/2013 Notify RN   Final  . Comment 2 11/29/2013 Documented in Chart   Final    PATHOLOGY: No new pathology.  Urinalysis    Component Value Date/Time   COLORURINE YELLOW 11/13/2013 [place] 11/13/2013 1509   LABSPEC 1.020 11/13/2013 1509   PHURINE 6.0 11/13/2013 1509   GLUCOSEU NEGATIVE 11/13/2013 1509   HGBUR LARGE* 11/13/2013 1509   BILIRUBINUR  NEGATIVE 11/13/2013 1509   KETONESUR 15* 11/13/2013 1509   PROTEINUR TRACE* 11/13/2013 1509   UROBILINOGEN 0.2 11/13/2013 1509   NITRITE NEGATIVE 11/13/2013 1509   LEUKOCYTESUR NEGATIVE 11/13/2013 1509    RADIOGRAPHIC STUDIES: No results found.  ASSESSMENT:  #1.Recurrent poorly 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 Indwelling Foley catheter removed. S/P Depo-Lupro and Casodex. 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 S/P Depo-Lupon 22.5 mg every 12 weeks and Casodex daily with failure in therapy which lead to a discontinuation of this therapy to switched to Zytiga/Prednisone starting on 07/06/2012. Depo-Lupron was appropriately restarted on 04/19/2013.  2. Cardiomyopathy with EF  25%  3. DM  4. HTN  5. Mild dementia  6. Xray evidence of arterial hypertension of chest x-ray on 11/22/2012.    PLAN:  #1. Depot Lupron 22.5 mg intramuscularly. #2. Continue zytiga 1250 mg daily along with prednisone 5 mg twice a day daily on an empty stomach. #3. Followup in 3 months with CBC, chem profile, PSA, and testosterone level.   All questions were answered. The patient knows to call the clinic with any problems, questions or concerns. We can certainly see the patient much sooner if necessary.   I spent 25 minutes counseling the patient face to face. The total time spent in the appointment was 30 minutes.    Doroteo Bradford, MD 01/21/2014 2:03 PM

## 2014-01-21 NOTE — Patient Instructions (Signed)
[facility] Discharge Instructions  RECOMMENDATIONS MADE BY THE CONSULTANT AND ANY TEST RESULTS WILL BE SENT TO YOUR REFERRING PHYSICIAN.  Return in 3 months for office visit with doctor and blood work.  Thank you for choosing [facility] to provide your oncology and hematology care.  To afford each patient quality time with our providers, please arrive at least 15 minutes before your scheduled appointment time.  With your help, our goal is to use those 15 minutes to complete the necessary work-up to ensure our physicians have the information they need to help with your evaluation and healthcare recommendations.    Effective January 1st, 2014, we ask that you re-schedule your appointment with our physicians should you arrive 10 or more minutes late for your appointment.  We strive to give you quality time with our providers, and arriving late affects you and other patients whose appointments are after yours.    Again, thank you for choosing [facility].  Our hope is that these requests will decrease the amount of time that you wait before being seen by our physicians.       _____________________________________________________________  Should you have questions after your visit to [facility], please contact our office at [phone] between the hours of 8:30 a.m. and 5:00 p.m.  Voicemails left after 4:30 p.m. will not be returned until the following business day.  For prescription refill requests, have your pharmacy contact our office with your prescription refill request.

## 2014-01-21 NOTE — Addendum Note (Signed)
Addended by: Joie Bimler on: 01/21/2014 02:24 PM   Modules accepted: Orders

## 2014-01-21 NOTE — Progress Notes (Signed)
Oscar Hickman presents today for injection per MD orders. Lupron 22.5 mg administered IM in right Gluteal. Administration without incident. Patient tolerated well.

## 2014-01-22 ENCOUNTER — Other Ambulatory Visit (HOSPITAL_COMMUNITY): Payer: Self-pay | Admitting: Oncology

## 2014-01-22 DIAGNOSIS — C61 Malignant neoplasm of prostate: Secondary | ICD-10-CM

## 2014-01-22 LAB — PSA: PSA: 0.75 ng/mL (ref <=4.00)

## 2014-01-22 LAB — TESTOSTERONE: Testosterone: 15 ng/dL — ABNORMAL LOW (ref 300–890)

## 2014-01-22 MED ORDER — ABIRATERONE ACETATE 250 MG PO TABS: 1000.0000 mg | ORAL_TABLET | Freq: Every day | ORAL | Status: DC

## 2014-01-22 NOTE — Progress Notes (Signed)
Oscar Hickman presented for labwork. Labs per MD order drawn via Peripheral Line 23 gauge needle inserted in right forearm.  Good blood return present. Procedure without incident.  Needle removed intact. Patient tolerated procedure well.

## 2014-02-19 ENCOUNTER — Other Ambulatory Visit (HOSPITAL_COMMUNITY): Payer: Self-pay | Admitting: Oncology

## 2014-02-19 DIAGNOSIS — C61 Malignant neoplasm of prostate: Secondary | ICD-10-CM

## 2014-02-19 MED ORDER — ABIRATERONE ACETATE 250 MG PO TABS: 1000.0000 mg | ORAL_TABLET | Freq: Every day | ORAL | Status: DC

## 2014-03-13 ENCOUNTER — Other Ambulatory Visit (HOSPITAL_COMMUNITY): Payer: Self-pay | Admitting: Oncology

## 2014-03-13 DIAGNOSIS — C61 Malignant neoplasm of prostate: Secondary | ICD-10-CM

## 2014-03-13 MED ORDER — PREDNISONE 5 MG PO TABS: 5.0000 mg | ORAL_TABLET | Freq: Two times a day (BID) | ORAL | Status: DC

## 2014-03-21 ENCOUNTER — Other Ambulatory Visit (HOSPITAL_COMMUNITY): Payer: Self-pay | Admitting: Oncology

## 2014-03-21 DIAGNOSIS — C61 Malignant neoplasm of prostate: Secondary | ICD-10-CM

## 2014-03-21 MED ORDER — ABIRATERONE ACETATE 250 MG PO TABS: 1000.0000 mg | ORAL_TABLET | Freq: Every day | ORAL | Status: DC

## 2014-04-08 ENCOUNTER — Other Ambulatory Visit (HOSPITAL_COMMUNITY): Payer: Self-pay | Admitting: Oncology

## 2014-04-08 DIAGNOSIS — C61 Malignant neoplasm of prostate: Secondary | ICD-10-CM

## 2014-04-08 MED ORDER — ABIRATERONE ACETATE 250 MG PO TABS: 1000.0000 mg | ORAL_TABLET | Freq: Every day | ORAL | Status: DC

## 2014-04-23 ENCOUNTER — Encounter (HOSPITAL_COMMUNITY): Payer: Medicare Other | Attending: Hematology and Oncology

## 2014-04-23 ENCOUNTER — Encounter (HOSPITAL_COMMUNITY): Payer: Self-pay

## 2014-04-23 ENCOUNTER — Encounter (HOSPITAL_BASED_OUTPATIENT_CLINIC_OR_DEPARTMENT_OTHER): Payer: Medicare Other

## 2014-04-23 VITALS — BP 118/47 | HR 81 | Temp 98.0°F | Resp 20 | Wt 189.1 lb

## 2014-04-23 DIAGNOSIS — Z7982 Long term (current) use of aspirin: Secondary | ICD-10-CM | POA: Insufficient documentation

## 2014-04-23 DIAGNOSIS — C801 Malignant (primary) neoplasm, unspecified: Secondary | ICD-10-CM | POA: Diagnosis not present

## 2014-04-23 DIAGNOSIS — I428 Other cardiomyopathies: Secondary | ICD-10-CM | POA: Insufficient documentation

## 2014-04-23 DIAGNOSIS — Z794 Long term (current) use of insulin: Secondary | ICD-10-CM | POA: Insufficient documentation

## 2014-04-23 DIAGNOSIS — J449 Chronic obstructive pulmonary disease, unspecified: Secondary | ICD-10-CM | POA: Insufficient documentation

## 2014-04-23 DIAGNOSIS — C61 Malignant neoplasm of prostate: Secondary | ICD-10-CM | POA: Insufficient documentation

## 2014-04-23 DIAGNOSIS — E119 Type 2 diabetes mellitus without complications: Secondary | ICD-10-CM | POA: Diagnosis not present

## 2014-04-23 DIAGNOSIS — I509 Heart failure, unspecified: Secondary | ICD-10-CM | POA: Diagnosis not present

## 2014-04-23 DIAGNOSIS — J4489 Other specified chronic obstructive pulmonary disease: Secondary | ICD-10-CM | POA: Insufficient documentation

## 2014-04-23 DIAGNOSIS — I10 Essential (primary) hypertension: Secondary | ICD-10-CM | POA: Diagnosis not present

## 2014-04-23 DIAGNOSIS — E785 Hyperlipidemia, unspecified: Secondary | ICD-10-CM | POA: Diagnosis not present

## 2014-04-23 DIAGNOSIS — C7919 Secondary malignant neoplasm of other urinary organs: Secondary | ICD-10-CM

## 2014-04-23 DIAGNOSIS — Z951 Presence of aortocoronary bypass graft: Secondary | ICD-10-CM | POA: Diagnosis not present

## 2014-04-23 DIAGNOSIS — Z9889 Other specified postprocedural states: Secondary | ICD-10-CM | POA: Insufficient documentation

## 2014-04-23 DIAGNOSIS — F0390 Unspecified dementia without behavioral disturbance: Secondary | ICD-10-CM | POA: Diagnosis not present

## 2014-04-23 DIAGNOSIS — Z5111 Encounter for antineoplastic chemotherapy: Secondary | ICD-10-CM

## 2014-04-23 DIAGNOSIS — Z923 Personal history of irradiation: Secondary | ICD-10-CM | POA: Diagnosis not present

## 2014-04-23 LAB — CBC WITH DIFFERENTIAL/PLATELET
BASOS ABS: 0 10*3/uL (ref 0.0–0.1)
Basophils Relative: 1 % (ref 0–1)
EOS PCT: 3 % (ref 0–5)
Eosinophils Absolute: 0.2 10*3/uL (ref 0.0–0.7)
HEMATOCRIT: 32.5 % — AB (ref 39.0–52.0)
Hemoglobin: 10.5 g/dL — ABNORMAL LOW (ref 13.0–17.0)
Lymphocytes Relative: 10 % — ABNORMAL LOW (ref 12–46)
Lymphs Abs: 0.6 10*3/uL — ABNORMAL LOW (ref 0.7–4.0)
MCH: 26.6 pg (ref 26.0–34.0)
MCHC: 32.3 g/dL (ref 30.0–36.0)
MCV: 82.3 fL (ref 78.0–100.0)
Monocytes Absolute: 0.5 10*3/uL (ref 0.1–1.0)
Monocytes Relative: 8 % (ref 3–12)
Neutro Abs: 4.9 10*3/uL (ref 1.7–7.7)
Neutrophils Relative %: 78 % — ABNORMAL HIGH (ref 43–77)
PLATELETS: 318 10*3/uL (ref 150–400)
RBC: 3.95 MIL/uL — ABNORMAL LOW (ref 4.22–5.81)
RDW: 17.9 % — AB (ref 11.5–15.5)
WBC: 6.3 10*3/uL (ref 4.0–10.5)

## 2014-04-23 LAB — COMPREHENSIVE METABOLIC PANEL
ALT: 13 U/L (ref 0–53)
AST: 16 U/L (ref 0–37)
Albumin: 3.1 g/dL — ABNORMAL LOW (ref 3.5–5.2)
Alkaline Phosphatase: 86 U/L (ref 39–117)
BUN: 12 mg/dL (ref 6–23)
CALCIUM: 9 mg/dL (ref 8.4–10.5)
CO2: 30 meq/L (ref 19–32)
CREATININE: 0.8 mg/dL (ref 0.50–1.35)
Chloride: 106 mEq/L (ref 96–112)
GFR calc Af Amer: >90 mL/min (ref >90)
GFR, EST NON AFRICAN AMERICAN: 88 mL/min — AB (ref >90)
Glucose, Bld: 225 mg/dL — ABNORMAL HIGH (ref 70–99)
Potassium: 3.8 mEq/L (ref 3.7–5.3)
Sodium: 145 mEq/L (ref 137–147)
TOTAL PROTEIN: 6.4 g/dL (ref 6.0–8.3)
Total Bilirubin: 0.6 mg/dL (ref 0.3–1.2)

## 2014-04-23 MED ORDER — LEUPROLIDE ACETATE (3 MONTH) 22.5 MG IM KIT
22.5000 mg | PACK | Freq: Once | INTRAMUSCULAR | Status: AC
  Administered 2014-04-23: 22.5 mg via INTRAMUSCULAR
  Filled 2014-04-23: qty 22.5

## 2014-04-23 NOTE — Patient Instructions (Signed)
Oscar Hickman Discharge Instructions  RECOMMENDATIONS MADE BY THE CONSULTANT AND ANY TEST RESULTS WILL BE SENT TO YOUR REFERRING PHYSICIAN.  EXAM FINDINGS BY THE PHYSICIAN TODAY AND SIGNS OR SYMPTOMS TO REPORT TO CLINIC OR PRIMARY PHYSICIAN: Exam and findings as discussed by Dr. Barnet Glasgow.  Make sure you follow-up with Dr. Cindie Laroche as scheduled.  Your blood pressure is low today and your heart beat is a little irregular.  When moving from a sitting to a standing position, move slowly. Lupron injection today. Report uncontrolled pain, shortness of breath or other problems.  MEDICATIONS PRESCRIBED:  Continue zytiga and prednisone.    INSTRUCTIONS/FOLLOW-UP: Follow-up in 3 months with blood work, office visit and injection.  Thank you for choosing [facility] to provide your oncology and hematology care.  To afford each patient quality time with our providers, please arrive at least 15 minutes before your scheduled appointment time.  With your help, our goal is to use those 15 minutes to complete the necessary work-up to ensure our physicians have the information they need to help with your evaluation and healthcare recommendations.    Effective January 1st, 2014, we ask that you re-schedule your appointment with our physicians should you arrive 10 or more minutes late for your appointment.  We strive to give you quality time with our providers, and arriving late affects you and other patients whose appointments are after yours.    Again, thank you for choosing [facility].  Our hope is that these requests will decrease the amount of time that you wait before being seen by our physicians.       _____________________________________________________________  Should you have questions after your visit to [facility], please contact our office at [phone] between the hours of 8:30 a.m. and 5:00 p.m.  Voicemails left after 4:30 p.m. will  not be returned until the following business day.  For prescription refill requests, have your pharmacy contact our office with your prescription refill request.

## 2014-04-23 NOTE — Progress Notes (Signed)
LABS DRAWN FOR CBC/DIFF, CMP, PSA, TESTOSTERONE.

## 2014-04-23 NOTE — Progress Notes (Signed)
[place]  OFFICE PROGRESS NOTE  [name], MD [place] [id number]  DIAGNOSIS: ADENOCARCINOMA, PROSTATE - Plan: SCHEDULING COMMUNICATION INJECTION, leuprolide (LUPRON) injection 22.5 mg  Chief Complaint  Patient presents with  . Prostate Cancer    CURRENT THERAPY: Depo Lupron 22.5 mg every 3 months intramuscularly, Zytiga/prednisone daily started on 07/06/2012.   INTERVAL HISTORY: Oscar Hickman 73 y.o. male returns for for followup while taking Zytiga/000 mg daily with prednisone 5 mg twice a day  for metastatic castrate resistant prostate cancer, status post TURP  therapy in 2009 followed by external beam radiotherapy. He has felt dizzy overlay but denies any hot flashes or breast pain. He said occasional left upper quadrant abdominal pain without palpitations, PND, or orthopnea. Appetite is good with no nausea, vomiting, diarrhea, constipation, but still with incontinence. He does wear Depends.Marland Kitchen  MEDICAL HISTORY: Past Medical History  Diagnosis Date  . Skin infection     history  . HTN (hypertension) 11/23/2011  . High cholesterol   . Diabetes mellitus   . Chronic headache     daily  . Anxiety   . Osteoarthritis   . COPD (chronic obstructive pulmonary disease)     patient denies  . Peripheral neuropathy   . 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 CHF (congestive heart failure)   . Medical non-compliance   . Heart disease   . Cognitive dysfunction     INTERIM HISTORY: has ADENOCARCINOMA, PROSTATE; DIABETES MELLITUS, TYPE II; HYPERLIPIDEMIA; HTN (hypertension); Cardiomyopathy, nonischemic; Pyelonephritis; Hydronephrosis of right kidney; Acute on chronic systolic CHF (congestive heart failure); Acute CHF; and CHF (congestive heart failure) on his problem list.   Recurrent poorly differentiated carcinoma of the prostate involving the penile urethra S/P biopsy and  fulguration March 2011. Indwelling Foley catheter removed. S/P Depo-Lupro and Casodex. 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 S/P Depo-Lupon 22.5 mg every 12 weeks and Casodex daily with failure in therapy which lead to a discontinuation of this therapy to switched to Zytiga/Prednisone starting on 07/06/2012. Depo-Lupron was appropriately restarted on 04/19/2013.  ALLERGIES:  has No Known Allergies.  MEDICATIONS: has a current medication list which includes the following prescription(s): abiraterone acetate, albuterol, amlodipine, aspirin ec, carvedilol, clonidine, furosemide, hydralazine, insulin glargine, isosorbide dinitrate, metformin, prednisone, and tamsulosin.  SURGICAL HISTORY:  Past Surgical History  Procedure Laterality Date  . Transurethral resection of prostate  03/2008  . Cardiac catheterization  02/2009    minimal non-obstructive CAD  . Cystoscopy w/ ureteral stent placement Right 04/06/2013    Procedure: CYSTOSCOPY WITH RETROGRADE PYELOGRAM/URETERAL STENT PLACEMENT;  Surgeon: Marissa Nestle, MD;  Location: [facility];  Service: Urology;  Laterality: Right;  . Cystoscopy w/ ureteral stent removal Right 04/06/2013    Procedure: CYSTOSCOPY WITH STENT REMOVAL;  Surgeon: Marissa Nestle, MD;  Location: [facility];  Service: Urology;  Laterality: Right;    FAMILY HISTORY: family history includes Cancer in his father and mother.  SOCIAL HISTORY:  reports that he has never smoked. He has never used smokeless tobacco. He reports that he does not drink alcohol or use illicit drugs.  REVIEW OF SYSTEMS:  Other than that discussed above is noncontributory.  PHYSICAL EXAMINATION: ECOG PERFORMANCE STATUS: 1 - Symptomatic but completely ambulatory  Blood pressure 118/47, pulse 81, temperature 98 F (36.7 C), temperature source Oral, resp. rate 20, weight  189 lb 1.6 oz (85.775 kg).  GENERAL:alert, no distress and comfortable SKIN:  skin color, texture, turgor are normal, no rashes or significant lesions EYES: PERLA; Conjunctiva are pink and non-injected, sclera clear SINUSES: No redness or tenderness over maxillary or ethmoid sinuses OROPHARYNX:no exudate, no erythema on lips, buccal mucosa, or tongue. NECK: supple, thyroid normal size, non-tender, without nodularity. No masses CHEST: Bilateral gynecomastia with increased AP diameter. LYMPH:  no palpable lymphadenopathy in the cervical, axillary or inguinal LUNGS: clear to auscultation and percussion with normal breathing effort HEART: Irregularly irregular with no S3. ABDOMEN:abdomen soft, non-tender and normal bowel sounds MUSCULOSKELETAL:no cyanosis of digits and no clubbing. Range of motion normal.  NEURO: alert & oriented x 3 with fluent speech, no focal motor/sensory deficits.   LABORATORY DATA:  Results for RAINEN, VANROSSUM (MRN [id number]) as of 04/23/2014 10:18  Ref. Range 07/19/2013 11:37 10/19/2013 11:11 01/21/2014 14:10  PSA Latest Range: <=4.00 ng/mL 0.17 0.29 0.75    Infusion on 04/23/2014  Component Date Value Ref Range Status  . WBC 04/23/2014 6.3  4.0 - 10.5 K/uL Final  . RBC 04/23/2014 3.95* 4.22 - 5.81 MIL/uL Final  . Hemoglobin 04/23/2014 10.5* 13.0 - 17.0 g/dL Final  . HCT 04/23/2014 32.5* 39.0 - 52.0 % Final  . MCV 04/23/2014 82.3  78.0 - 100.0 fL Final  . MCH 04/23/2014 26.6  26.0 - 34.0 pg Final  . MCHC 04/23/2014 32.3  30.0 - 36.0 g/dL Final  . RDW 04/23/2014 17.9* 11.5 - 15.5 % Final  . Platelets 04/23/2014 318  150 - 400 K/uL Final  . Neutrophils Relative % 04/23/2014 78* 43 - 77 % Final  . Neutro Abs 04/23/2014 4.9  1.7 - 7.7 K/uL Final  . Lymphocytes Relative 04/23/2014 10* 12 - 46 % Final  . Lymphs Abs 04/23/2014 0.6* 0.7 - 4.0 K/uL Final  . Monocytes Relative 04/23/2014 8  3 - 12 % Final  . Monocytes Absolute 04/23/2014 0.5  0.1 - 1.0 K/uL Final  . Eosinophils Relative 04/23/2014 3  0 - 5 % Final  . Eosinophils Absolute  04/23/2014 0.2  0.0 - 0.7 K/uL Final  . Basophils Relative 04/23/2014 1  0 - 1 % Final  . Basophils Absolute 04/23/2014 0.0  0.0 - 0.1 K/uL Final  . Sodium 04/23/2014 145  137 - 147 mEq/L Final  . Potassium 04/23/2014 3.8  3.7 - 5.3 mEq/L Final  . Chloride 04/23/2014 106  96 - 112 mEq/L Final  . CO2 04/23/2014 30  19 - 32 mEq/L Final  . Glucose, Bld 04/23/2014 225* 70 - 99 mg/dL Final  . BUN 04/23/2014 12  6 - 23 mg/dL Final  . Creatinine, Ser 04/23/2014 0.80  0.50 - 1.35 mg/dL Final  . Calcium 04/23/2014 9.0  8.4 - 10.5 mg/dL Final  . Total Protein 04/23/2014 6.4  6.0 - 8.3 g/dL Final  . Albumin 04/23/2014 3.1* 3.5 - 5.2 g/dL Final  . AST 04/23/2014 16  0 - 37 U/L Final  . ALT 04/23/2014 13  0 - 53 U/L Final  . Alkaline Phosphatase 04/23/2014 86  39 - 117 U/L Final  . Total Bilirubin 04/23/2014 0.6  0.3 - 1.2 mg/dL Final  . GFR calc non Af Amer 04/23/2014 88* >90 mL/min Final  . GFR calc Af Amer 04/23/2014 >90  >90 mL/min Final   Comment: (NOTE)  The eGFR has been calculated using the CKD EPI equation.                          This calculation has not been validated in all clinical situations.                          eGFR's persistently <90 mL/min signify possible Chronic Kidney                          Disease.    PATHOLOGY: No new pathology.  Urinalysis    Component Value Date/Time   COLORURINE YELLOW 11/13/2013 [place] 11/13/2013 1509   LABSPEC 1.020 11/13/2013 1509   PHURINE 6.0 11/13/2013 1509   GLUCOSEU NEGATIVE 11/13/2013 1509   HGBUR LARGE* 11/13/2013 1509   BILIRUBINUR NEGATIVE 11/13/2013 1509   KETONESUR 15* 11/13/2013 1509   PROTEINUR TRACE* 11/13/2013 1509   UROBILINOGEN 0.2 11/13/2013 1509   NITRITE NEGATIVE 11/13/2013 1509   LEUKOCYTESUR NEGATIVE 11/13/2013 1509    RADIOGRAPHIC STUDIES: No results found.  ASSESSMENT:  #1.Recurrent poorly differentiated carcinoma of the prostate involving the penile urethra S/P biopsy and fulguration  March 2011. Indwelling Foley catheter removed. S/P Depo-Lupro and Casodex. 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 S/P Depo-Lupon 22.5 mg every 12 weeks and Casodex daily with failure in therapy which lead to a discontinuation of this therapy to switched to Zytiga/Prednisone starting on 07/06/2012. Depo-Lupron was appropriately restarted on 04/19/2013.  2. Cardiomyopathy with EF 25%,, now with decreased heart rate and blood pressure with irregularly irregular rhythm 3. DM  4. HTN  5. Mild dementia     PLAN:  #1. [place] Lupron 22.5 mg intramuscularly today. #2. Continue zytiga 1000 mg daily along with prednisone 5 mg twice a day. #3. Patient does have an appointment with his family physician on 04/30/2014. #4. Followup in 3 months with CBC, chem profile, PSA, and testosterone level.   All questions were answered. The patient knows to call the clinic with any problems, questions or concerns. We can certainly see the patient much sooner if necessary.   I spent 25 minutes counseling the patient face to face. The total time spent in the appointment was 30 minutes.    Doroteo Bradford, MD 04/23/2014 12:52 PM  DISCLAIMER:  This note was dictated with voice recognition software.  Similar sounding words can inadvertently be transcribed inaccurately and may not be corrected upon review.

## 2014-04-23 NOTE — Progress Notes (Signed)
Oscar Hickman presents today for injection per MD orders. Lupron 22.5 mg administered IM ztrack in left Gluteal. Administration without incident. Patient tolerated well.

## 2014-04-24 LAB — TESTOSTERONE: TESTOSTERONE: 14 ng/dL — AB (ref 300–890)

## 2014-04-24 LAB — PSA: PSA: 0.95 ng/mL (ref <=4.00)

## 2014-05-01 ENCOUNTER — Other Ambulatory Visit (HOSPITAL_COMMUNITY): Payer: Self-pay | Admitting: Oncology

## 2014-05-01 DIAGNOSIS — C61 Malignant neoplasm of prostate: Secondary | ICD-10-CM

## 2014-05-01 MED ORDER — ABIRATERONE ACETATE 250 MG PO TABS: 1000.0000 mg | ORAL_TABLET | Freq: Every day | ORAL | Status: DC

## 2014-05-19 ENCOUNTER — Encounter (HOSPITAL_COMMUNITY): Payer: Self-pay | Admitting: Emergency Medicine

## 2014-05-19 ENCOUNTER — Emergency Department (HOSPITAL_COMMUNITY): Payer: Medicare Other

## 2014-05-19 ENCOUNTER — Inpatient Hospital Stay (HOSPITAL_COMMUNITY)
Admission: EM | Admit: 2014-05-19 | Discharge: 2014-05-23 | DRG: 292 | Disposition: A | Discharge status: Home Health | Payer: Medicare Other | Attending: Family Medicine | Admitting: Family Medicine

## 2014-05-19 DIAGNOSIS — E876 Hypokalemia: Secondary | ICD-10-CM | POA: Diagnosis present

## 2014-05-19 DIAGNOSIS — Z794 Long term (current) use of insulin: Secondary | ICD-10-CM | POA: Diagnosis not present

## 2014-05-19 DIAGNOSIS — I509 Heart failure, unspecified: Secondary | ICD-10-CM

## 2014-05-19 DIAGNOSIS — F411 Generalized anxiety disorder: Secondary | ICD-10-CM | POA: Diagnosis present

## 2014-05-19 DIAGNOSIS — Z9119 Patient's noncompliance with other medical treatment and regimen: Secondary | ICD-10-CM

## 2014-05-19 DIAGNOSIS — J449 Chronic obstructive pulmonary disease, unspecified: Secondary | ICD-10-CM | POA: Diagnosis present

## 2014-05-19 DIAGNOSIS — Z8546 Personal history of malignant neoplasm of prostate: Secondary | ICD-10-CM | POA: Diagnosis not present

## 2014-05-19 DIAGNOSIS — I5023 Acute on chronic systolic (congestive) heart failure: Secondary | ICD-10-CM | POA: Diagnosis present

## 2014-05-19 DIAGNOSIS — E119 Type 2 diabetes mellitus without complications: Secondary | ICD-10-CM | POA: Diagnosis present

## 2014-05-19 DIAGNOSIS — E785 Hyperlipidemia, unspecified: Secondary | ICD-10-CM

## 2014-05-19 DIAGNOSIS — E7800 Pure hypercholesterolemia, unspecified: Secondary | ICD-10-CM | POA: Diagnosis present

## 2014-05-19 DIAGNOSIS — Z7982 Long term (current) use of aspirin: Secondary | ICD-10-CM

## 2014-05-19 DIAGNOSIS — Z91199 Patient's noncompliance with other medical treatment and regimen due to unspecified reason: Secondary | ICD-10-CM | POA: Diagnosis not present

## 2014-05-19 DIAGNOSIS — I10 Essential (primary) hypertension: Secondary | ICD-10-CM | POA: Diagnosis present

## 2014-05-19 DIAGNOSIS — C61 Malignant neoplasm of prostate: Secondary | ICD-10-CM

## 2014-05-19 DIAGNOSIS — M1990 Unspecified osteoarthritis, unspecified site: Secondary | ICD-10-CM | POA: Diagnosis present

## 2014-05-19 DIAGNOSIS — E118 Type 2 diabetes mellitus with unspecified complications: Secondary | ICD-10-CM | POA: Diagnosis present

## 2014-05-19 DIAGNOSIS — I428 Other cardiomyopathies: Secondary | ICD-10-CM

## 2014-05-19 DIAGNOSIS — I5043 Acute on chronic combined systolic (congestive) and diastolic (congestive) heart failure: Secondary | ICD-10-CM

## 2014-05-19 DIAGNOSIS — J4489 Other specified chronic obstructive pulmonary disease: Secondary | ICD-10-CM | POA: Diagnosis present

## 2014-05-19 DIAGNOSIS — R0602 Shortness of breath: Secondary | ICD-10-CM | POA: Diagnosis present

## 2014-05-19 DIAGNOSIS — Z79899 Other long term (current) drug therapy: Secondary | ICD-10-CM | POA: Diagnosis not present

## 2014-05-19 DIAGNOSIS — I5021 Acute systolic (congestive) heart failure: Secondary | ICD-10-CM | POA: Diagnosis present

## 2014-05-19 DIAGNOSIS — I159 Secondary hypertension, unspecified: Secondary | ICD-10-CM

## 2014-05-19 DIAGNOSIS — G609 Hereditary and idiopathic neuropathy, unspecified: Secondary | ICD-10-CM | POA: Diagnosis present

## 2014-05-19 LAB — GLUCOSE, CAPILLARY
GLUCOSE-CAPILLARY: 115 mg/dL — AB (ref 70–99)
Glucose-Capillary: 149 mg/dL — ABNORMAL HIGH (ref 70–99)
Glucose-Capillary: 202 mg/dL — ABNORMAL HIGH (ref 70–99)

## 2014-05-19 LAB — CBC WITH DIFFERENTIAL/PLATELET
BASOS ABS: 0 10*3/uL (ref 0.0–0.1)
BASOS PCT: 1 % (ref 0–1)
Eosinophils Absolute: 0.3 10*3/uL (ref 0.0–0.7)
Eosinophils Relative: 4 % (ref 0–5)
HEMATOCRIT: 35.3 % — AB (ref 39.0–52.0)
Hemoglobin: 11.3 g/dL — ABNORMAL LOW (ref 13.0–17.0)
Lymphocytes Relative: 22 % (ref 12–46)
Lymphs Abs: 1.4 10*3/uL (ref 0.7–4.0)
MCH: 26.3 pg (ref 26.0–34.0)
MCHC: 32 g/dL (ref 30.0–36.0)
MCV: 82.1 fL (ref 78.0–100.0)
Monocytes Absolute: 0.6 10*3/uL (ref 0.1–1.0)
Monocytes Relative: 10 % (ref 3–12)
NEUTROS ABS: 4 10*3/uL (ref 1.7–7.7)
Neutrophils Relative %: 63 % (ref 43–77)
PLATELETS: 332 10*3/uL (ref 150–400)
RBC: 4.3 MIL/uL (ref 4.22–5.81)
RDW: 17.9 % — AB (ref 11.5–15.5)
WBC: 6.3 10*3/uL (ref 4.0–10.5)

## 2014-05-19 LAB — TROPONIN I: Troponin I: <0.3 ng/mL (ref <0.30)

## 2014-05-19 LAB — URINALYSIS, ROUTINE W REFLEX MICROSCOPIC
Bilirubin Urine: NEGATIVE
Glucose, UA: NEGATIVE mg/dL
Ketones, ur: NEGATIVE mg/dL
Leukocytes, UA: NEGATIVE
NITRITE: NEGATIVE
Protein, ur: 100 mg/dL — AB
SPECIFIC GRAVITY, URINE: 1.02 (ref 1.005–1.030)
UROBILINOGEN UA: 0.2 mg/dL (ref 0.0–1.0)
pH: 6.5 (ref 5.0–8.0)

## 2014-05-19 LAB — BASIC METABOLIC PANEL
ANION GAP: 10 (ref 5–15)
BUN: 25 mg/dL — AB (ref 6–23)
CHLORIDE: 103 meq/L (ref 96–112)
CO2: 32 mEq/L (ref 19–32)
Calcium: 9 mg/dL (ref 8.4–10.5)
Creatinine, Ser: 0.93 mg/dL (ref 0.50–1.35)
GFR calc non Af Amer: 83 mL/min — ABNORMAL LOW (ref >90)
Glucose, Bld: 200 mg/dL — ABNORMAL HIGH (ref 70–99)
Potassium: 3.4 mEq/L — ABNORMAL LOW (ref 3.7–5.3)
SODIUM: 145 meq/L (ref 137–147)

## 2014-05-19 LAB — URINE MICROSCOPIC-ADD ON

## 2014-05-19 LAB — PRO B NATRIURETIC PEPTIDE: PRO B NATRI PEPTIDE: 7039 pg/mL — AB (ref 0–125)

## 2014-05-19 MED ORDER — INSULIN GLARGINE 100 UNIT/ML ~~LOC~~ SOLN
15.0000 [IU] | Freq: Every day | SUBCUTANEOUS | Status: DC
  Administered 2014-05-19 – 2014-05-23 (×5): 15 [IU] via SUBCUTANEOUS
  Filled 2014-05-19 (×6): qty 0.15

## 2014-05-19 MED ORDER — SODIUM CHLORIDE 0.9 % IV SOLN
250.0000 mL | INTRAVENOUS | Status: DC | PRN
Start: 2014-05-19 — End: 2014-05-23

## 2014-05-19 MED ORDER — ACETAMINOPHEN 325 MG PO TABS
650.0000 mg | ORAL_TABLET | ORAL | Status: DC | PRN
  Administered 2014-05-21: 650 mg via ORAL
  Filled 2014-05-19: qty 2

## 2014-05-19 MED ORDER — SODIUM CHLORIDE 0.9 % IJ SOLN
3.0000 mL | Freq: Two times a day (BID) | INTRAMUSCULAR | Status: DC
  Administered 2014-05-19 – 2014-05-23 (×9): 3 mL via INTRAVENOUS

## 2014-05-19 MED ORDER — ISOSORBIDE DINITRATE 20 MG PO TABS
20.0000 mg | ORAL_TABLET | Freq: Three times a day (TID) | ORAL | Status: DC
  Administered 2014-05-19 – 2014-05-23 (×13): 20 mg via ORAL
  Filled 2014-05-19 (×13): qty 1

## 2014-05-19 MED ORDER — SODIUM CHLORIDE 0.9 % IJ SOLN: 3.0000 mL | INTRAMUSCULAR | Status: DC | PRN

## 2014-05-19 MED ORDER — CARVEDILOL 12.5 MG PO TABS: 25.0000 mg | ORAL_TABLET | Freq: Two times a day (BID) | ORAL | Status: DC

## 2014-05-19 MED ORDER — AMLODIPINE BESYLATE 5 MG PO TABS
10.0000 mg | ORAL_TABLET | Freq: Once | ORAL | Status: AC
  Administered 2014-05-19: 10 mg via ORAL
  Filled 2014-05-19: qty 2

## 2014-05-19 MED ORDER — INSULIN ASPART 100 UNIT/ML ~~LOC~~ SOLN
0.0000 [IU] | Freq: Three times a day (TID) | SUBCUTANEOUS | Status: DC
  Administered 2014-05-19: 1 [IU] via SUBCUTANEOUS
  Administered 2014-05-20: 2 [IU] via SUBCUTANEOUS
  Administered 2014-05-20: 1 [IU] via SUBCUTANEOUS
  Administered 2014-05-21: 3 [IU] via SUBCUTANEOUS
  Administered 2014-05-22 – 2014-05-23 (×2): 2 [IU] via SUBCUTANEOUS

## 2014-05-19 MED ORDER — CLONIDINE HCL 0.2 MG PO TABS
0.2000 mg | ORAL_TABLET | Freq: Two times a day (BID) | ORAL | Status: DC
  Administered 2014-05-19 – 2014-05-23 (×9): 0.2 mg via ORAL
  Filled 2014-05-19 (×9): qty 1

## 2014-05-19 MED ORDER — ASPIRIN EC 81 MG PO TBEC
81.0000 mg | DELAYED_RELEASE_TABLET | Freq: Every day | ORAL | Status: DC
  Administered 2014-05-19 – 2014-05-23 (×5): 81 mg via ORAL
  Filled 2014-05-19 (×5): qty 1

## 2014-05-19 MED ORDER — ONDANSETRON HCL 4 MG/2ML IJ SOLN: 4.0000 mg | Freq: Four times a day (QID) | INTRAMUSCULAR | Status: DC | PRN

## 2014-05-19 MED ORDER — FUROSEMIDE 10 MG/ML IJ SOLN
40.0000 mg | Freq: Two times a day (BID) | INTRAMUSCULAR | Status: DC
  Administered 2014-05-19 – 2014-05-23 (×8): 40 mg via INTRAVENOUS
  Filled 2014-05-19 (×8): qty 4

## 2014-05-19 MED ORDER — HYDRALAZINE HCL 20 MG/ML IJ SOLN: 5.0000 mg | Freq: Four times a day (QID) | INTRAMUSCULAR | Status: DC | PRN

## 2014-05-19 MED ORDER — TAMSULOSIN HCL 0.4 MG PO CAPS
0.4000 mg | ORAL_CAPSULE | Freq: Every day | ORAL | Status: DC
  Administered 2014-05-19 – 2014-05-23 (×5): 0.4 mg via ORAL
  Filled 2014-05-19 (×5): qty 1

## 2014-05-19 MED ORDER — CARVEDILOL 12.5 MG PO TABS
25.0000 mg | ORAL_TABLET | Freq: Two times a day (BID) | ORAL | Status: DC
  Administered 2014-05-19 – 2014-05-23 (×9): 25 mg via ORAL
  Filled 2014-05-19 (×9): qty 2

## 2014-05-19 MED ORDER — PREDNISONE 10 MG PO TABS
5.0000 mg | ORAL_TABLET | Freq: Two times a day (BID) | ORAL | Status: DC
  Administered 2014-05-19 – 2014-05-23 (×8): 5 mg via ORAL
  Filled 2014-05-19 (×8): qty 1

## 2014-05-19 MED ORDER — FUROSEMIDE 10 MG/ML IJ SOLN
40.0000 mg | Freq: Once | INTRAMUSCULAR | Status: AC
  Administered 2014-05-19: 40 mg via INTRAVENOUS
  Filled 2014-05-19: qty 4

## 2014-05-19 MED ORDER — ENOXAPARIN SODIUM 40 MG/0.4ML ~~LOC~~ SOLN
40.0000 mg | SUBCUTANEOUS | Status: DC
Start: 2014-05-19 — End: 2014-05-23
  Administered 2014-05-19 – 2014-05-23 (×5): 40 mg via SUBCUTANEOUS
  Filled 2014-05-19 (×5): qty 0.4

## 2014-05-19 NOTE — ED Provider Notes (Signed)
Medical screening examination/treatment/procedure(s) were conducted as a shared visit with non-physician practitioner(s) and myself.  I personally evaluated the patient during the encounter.   EKG Interpretation   Date/Time:  Sunday May 19 2014 08:18:44 EDT Ventricular Rate:  80 PR Interval:  195 QRS Duration: 116 QT Interval:  484 QTC Calculation: 558 R Axis:   11 Text Interpretation:  Sinus rhythm Ventricular trigeminy Incomplete right  bundle branch block Confirmed by Graycen Degan  MD, Darnella Zeiter ([id number]) on 05/19/2014  8:26:14 AM     Patient presents with dyspnea, peripheral edema, chest x-ray consistent with congestive heart failure. IV Lasix. Admit.  Nat Christen, MD 05/19/14 [phone]

## 2014-05-19 NOTE — Progress Notes (Signed)
Echocardiogram 2D Echocardiogram has been performed.  Oscar Hickman 05/19/2014, 2:56 PM

## 2014-05-19 NOTE — ED Notes (Addendum)
Patient c/o shortness of breath and swelling in legs bilaterally. Per patient productive cough with thick white sputum. Denies any chest pain but reports headache. Patient reports hx of congested heart failure. Patient also reports being taken off Amlodipine 10mg  and Hydralazine 50mg  last month because blood pressure "kept dropping."

## 2014-05-19 NOTE — H&P (Signed)
History and Physical  Oscar Hickman ATF:[id number] DOB: [birth year] DOA: 05/19/2014  Referring physician: Lily Kocher PA-C in ED PCP: [name], MD   Chief Complaint: Shortness of breath  HPI:   73 year old man with history of systolic heart failure presented with increasing shortness of breath and lower extremity edema. Initial evaluation consistent with acute systolic heart failure, treated with Lasix and referred for admission.  The patient reports is somewhat vague time line of symptoms, beginning at least several days ago for not 1-2 weeks with increasing lower extremity edema and dyspnea on exertion. Positional changes to assist with breathing. No orthopnea. He has had no chest pain or systemic symptoms. Breathing has been getting worse. Recently PCP stopped amlodipine and hydralazine because of low blood pressure.  In the emergency department afebrile, tachypneic, hypertensive. Urine output 1 L. Basic metabolic panel, CBC unremarkable. Troponin negative. BNP 7039. Urinalysis negative. Chest x-ray consistent with CHF. EKG sinus rhythm, ventricular trigeminy. No acute changes.  Review of Systems:  Negative for fever, new visual changes, sore throat, rash, new muscle aches, chest pain, dysuria, bleeding, n/v/abdominal pain.  Past Medical History  Diagnosis Date  . Skin infection     history  . HTN (hypertension) 11/23/2011  . High cholesterol   . Diabetes mellitus   . Chronic headache     daily  . Anxiety   . Osteoarthritis   . COPD (chronic obstructive pulmonary disease)     patient denies  . Peripheral neuropathy   . 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 CHF (congestive heart failure)   . Medical non-compliance   . Heart disease   . Cognitive dysfunction     Past Surgical History  Procedure Laterality Date  . Transurethral resection of prostate  03/2008  . Cardiac catheterization  02/2009    minimal non-obstructive CAD  .  Cystoscopy w/ ureteral stent placement Right 04/06/2013    Procedure: CYSTOSCOPY WITH RETROGRADE PYELOGRAM/URETERAL STENT PLACEMENT;  Surgeon: Marissa Nestle, MD;  Location: [facility];  Service: Urology;  Laterality: Right;  . Cystoscopy w/ ureteral stent removal Right 04/06/2013    Procedure: CYSTOSCOPY WITH STENT REMOVAL;  Surgeon: Marissa Nestle, MD;  Location: [facility];  Service: Urology;  Laterality: Right;    Social History:  reports that he has never smoked. He has never used smokeless tobacco. He reports that he does not drink alcohol or use illicit drugs.  No Known Allergies  Family History  Problem Relation Age of Onset  . Cancer Mother   . Cancer Father      Prior to Admission medications   Medication Sig Start Date End Date Taking? Authorizing Provider  abiraterone Acetate (ZYTIGA) 250 MG tablet Take 4 tablets (1,000 mg total) by mouth daily. Take on an empty stomach 1 hour before or 2 hours after a meal 05/01/14  Yes Manon Hilding [name], PA-C  carvedilol (COREG) 25 MG tablet Take 25 mg by mouth 2 (two) times daily with a meal.   Yes Historical Provider, MD  cloNIDine (CATAPRES) 0.2 MG tablet Take 1 tablet (0.2 mg total) by mouth 2 (two) times daily. 11/17/13  Yes [name], MD  furosemide (LASIX) 20 MG tablet Take 1 tablet (20 mg total) by mouth 2 (two) times daily. 11/29/13  Yes [name], MD  isosorbide dinitrate (ISORDIL) 20 MG tablet Take 20 mg by mouth 3 (three) times daily.  06/18/11  Yes Historical Provider, MD  metFORMIN (GLUCOPHAGE) 500  MG tablet Take 500 mg by mouth 2 (two) times daily with a meal.     Yes Historical Provider, MD  tamsulosin (FLOMAX) 0.4 MG CAPS capsule Take 1 capsule (0.4 mg total) by mouth daily. 11/17/13  Yes [name], MD  albuterol (PROVENTIL) (2.5 MG/3ML) 0.083% nebulizer solution Take 3 mLs (2.5 mg total) by nebulization every 2 (two) hours as needed for wheezing or shortness of breath. 11/29/13   [name], MD    aspirin EC 81 MG tablet Take 81 mg by mouth daily.    Historical Provider, MD  insulin glargine (LANTUS) 100 UNIT/ML injection Inject 25-26 Units into the skin daily.     Historical Provider, MD  predniSONE (DELTASONE) 5 MG tablet Take 1 tablet (5 mg total) by mouth 2 (two) times daily with a meal. 03/13/14   Baird Cancer, PA-C   Physical Exam: Filed Vitals:   05/19/14 0900 05/19/14 0930 05/19/14 1000 05/19/14 1005  BP: 177/110 179/117 189/113 185/118  Pulse: 80 78 77 39  Temp:      TempSrc:      Resp: 31 30 28 27   Height:      Weight:      SpO2: 95% 93% 94% 95%    General: Examined in the emergency department.  Appears calm and comfortable Eyes: PERRL, arcus senilis, normal lids, irises  ENT: Somewhat hard of hearing, lips and tongue appear unremarkable  Neck: no LAD, masses or thyromegaly; JVD noted Cardiovascular: RRR, no m/r/g. 2+ bilateral LE edema. Respiratory: CTA bilaterally anteriorly, no w/r/r. Posteriorly some inspiratory crackles. Mild to moderate increased respiratory effort. Abdomen: soft, ntnd Skin: no rash or induration seen  Musculoskeletal: grossly normal tone BUE/BLE Psychiatric: grossly normal mood and affect, speech fluent and appropriate Neurologic: grossly non-focal.  Wt Readings from Last 3 Encounters:  05/19/14 84.823 kg (187 lb)  04/23/14 85.775 kg (189 lb 1.6 oz)  01/21/14 86.41 kg (190 lb 8 oz)    Labs on Admission:  Basic Metabolic Panel:  Recent Labs Lab 05/19/14 0845  NA 145  K 3.4*  CL 103  CO2 32  GLUCOSE 200*  BUN 25*  CREATININE 0.93  CALCIUM 9.0   CBC:  Recent Labs Lab 05/19/14 0845  WBC 6.3  NEUTROABS 4.0  HGB 11.3*  HCT 35.3*  MCV 82.1  PLT 332    Cardiac Enzymes:  Recent Labs Lab 05/19/14 0845  TROPONINI <0.30     Recent Labs  11/24/13 1406 11/27/13 0554 05/19/14 0845  PROBNP 3815.0* 1323.0* 7039.0*     Radiological Exams on Admission: Dg Chest Port 1 View  05/19/2014   CLINICAL DATA:   Shortness of breath.  EXAM: PORTABLE CHEST - 1 VIEW  COMPARISON:  11/24/2013.  FINDINGS: Severe cardiomegaly. Pulmonary vascular prominence. Mild interstitial prominence. These findings consistent with congestive heart failure with interstitial edema. No pleural effusion or pneumothorax.  IMPRESSION: Findings consistent with congestive heart failure and interstitial edema. Similar findings noted on prior study of 11/24/2013.   Electronically Signed   By: Marcello Moores  Register   On: 05/19/2014 09:06    Principal Problem:   Acute on chronic systolic CHF (congestive heart failure) Active Problems:   DIABETES MELLITUS, TYPE II   Acute systolic CHF (congestive heart failure)   Accelerated hypertension   Assessment/Plan 1. Acute systolic congestive heart failure, excellent diuresis already with Lasix with subjective improvement. No signs or symptoms to suggest ACS. 2. Accelerated hypertension. Recently taken off some antihypertensives. 3. Diabetes mellitus type 2.  Appears stable for admission to telemetry, plan aggressive diuresis, serial troponin, repeat 2-D echocardiogram. Continue Coreg, Isordil.  Continue outpatient antihypertensives, add hydralazine.  Sliding-scale insulin.  Discussed in detail with son at bedside.  Code Status: full code  DVT prophylaxis:Lovenox Family Communication:  Disposition Plan/Anticipated LOS: admit, 2-3 days  Time spent: 60 minutes  Murray Hodgkins, MD  [facility] Pager [phone] 05/19/2014, 10:58 AM

## 2014-05-19 NOTE — ED Notes (Signed)
States headache is better 

## 2014-05-19 NOTE — ED Notes (Signed)
Gown and bedsheet changed after urinal spilled.

## 2014-05-19 NOTE — ED Notes (Signed)
Dr Goodrich at bedside

## 2014-05-19 NOTE — Progress Notes (Signed)
Utilization review Completed Cami Delawder RN BSN   

## 2014-05-19 NOTE — ED Provider Notes (Signed)
CSN: [id number]     Arrival date & time 05/19/14  0808 History   First MD Initiated Contact with Patient 05/19/14 [phone]     Chief Complaint  Patient presents with  . Shortness of Breath  . Leg Swelling     (Consider location/radiation/quality/duration/timing/severity/associated sxs/prior Treatment) HPI Comments: The patient is a 73 year old male who presents to the emergency department with complaint of shortness of breath, and swelling of the legs. The patient states that this problem started one or 2 days ago. He has been noticing shortness of breath with activity as well as at rest. He states he has to turn frequently to find a comfortable position because of his shortness of breath. The patient denies any chest pain. He states that he has been coughing" white stuff". He is noted swelling of the legs. He denies any nausea vomiting or diarrhea. There's been no loss of consciousness. The patient does say state that he has been having some increased fatigue recently.  It is of note that approximately 1-1-1/2 weeks ago the patient had a change in his blood pressure medications because his blood pressure was" going too low at times". The patient says that seemingly since that time he's been having more and more problems with his breathing. He denies any hemoptysis. His been no high fever reported. The patient has a history of diabetes- insulin-dependent, hypertension, nonischemic cardiomyopathy with an ejection fraction of 20-25%, and prostate cancer.  Patient is a 73 y.o. male presenting with shortness of breath. The history is provided by the patient and the spouse.  Shortness of Breath Associated symptoms: cough and headaches   Associated symptoms: no abdominal pain, no chest pain, no neck pain and no wheezing     Past Medical History  Diagnosis Date  . Skin infection     history  . HTN (hypertension) 11/23/2011  . High cholesterol   . Diabetes mellitus   . Chronic headache     daily  .  Anxiety   . Osteoarthritis   . COPD (chronic obstructive pulmonary disease)     patient denies  . Peripheral neuropathy   . 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 CHF (congestive heart failure)   . Medical non-compliance   . Heart disease   . Cognitive dysfunction    Past Surgical History  Procedure Laterality Date  . Transurethral resection of prostate  03/2008  . Cardiac catheterization  02/2009    minimal non-obstructive CAD  . Cystoscopy w/ ureteral stent placement Right 04/06/2013    Procedure: CYSTOSCOPY WITH RETROGRADE PYELOGRAM/URETERAL STENT PLACEMENT;  Surgeon: Marissa Nestle, MD;  Location: [facility];  Service: Urology;  Laterality: Right;  . Cystoscopy w/ ureteral stent removal Right 04/06/2013    Procedure: CYSTOSCOPY WITH STENT REMOVAL;  Surgeon: Marissa Nestle, MD;  Location: [facility];  Service: Urology;  Laterality: Right;   Family History  Problem Relation Age of Onset  . Cancer Mother   . Cancer Father    History  Substance Use Topics  . Smoking status: Never Smoker   . Smokeless tobacco: Never Used  . Alcohol Use: No    Review of Systems  Constitutional: Negative for activity change.       All ROS Neg except as noted in HPI  Eyes: Negative for photophobia and discharge.  Respiratory: Positive for cough and shortness of breath. Negative for wheezing.   Cardiovascular: Negative for chest pain and palpitations.  Gastrointestinal: Negative for abdominal  pain and blood in stool.  Genitourinary: Negative for dysuria, frequency and hematuria.  Musculoskeletal: Positive for arthralgias. Negative for back pain and neck pain.  Skin: Negative.   Neurological: Positive for headaches. Negative for dizziness, seizures and speech difficulty.  Psychiatric/Behavioral: Negative for hallucinations and confusion. The patient is nervous/anxious.       Allergies  Review of patient's allergies indicates no known allergies.  Home Medications    Prior to Admission medications   Medication Sig Start Date End Date Taking? Authorizing Provider  abiraterone Acetate (ZYTIGA) 250 MG tablet Take 4 tablets (1,000 mg total) by mouth daily. Take on an empty stomach 1 hour before or 2 hours after a meal 05/01/14   Baird Cancer, PA-C  albuterol (PROVENTIL) (2.5 MG/3ML) 0.083% nebulizer solution Take 3 mLs (2.5 mg total) by nebulization every 2 (two) hours as needed for wheezing or shortness of breath. 11/29/13   [name], MD  amLODipine (NORVASC) 10 MG tablet Take 10 mg by mouth daily.    Historical Provider, MD  aspirin EC 81 MG tablet Take 81 mg by mouth daily.    Historical Provider, MD  carvedilol (COREG) 25 MG tablet Take 25 mg by mouth 2 (two) times daily with a meal.    Historical Provider, MD  cloNIDine (CATAPRES) 0.2 MG tablet Take 1 tablet (0.2 mg total) by mouth 2 (two) times daily. 11/17/13   [name], MD  furosemide (LASIX) 20 MG tablet Take 1 tablet (20 mg total) by mouth 2 (two) times daily. 11/29/13   [name], MD  hydrALAZINE (APRESOLINE) 50 MG tablet Take 50 mg by mouth 3 (three) times daily.    Historical Provider, MD  insulin glargine (LANTUS) 100 UNIT/ML injection Inject 25-26 Units into the skin daily.     Historical Provider, MD  isosorbide dinitrate (ISORDIL) 20 MG tablet Take 20 mg by mouth 3 (three) times daily.  06/18/11   Historical Provider, MD  metFORMIN (GLUCOPHAGE) 500 MG tablet Take 500 mg by mouth 2 (two) times daily with a meal.      Historical Provider, MD  predniSONE (DELTASONE) 5 MG tablet Take 1 tablet (5 mg total) by mouth 2 (two) times daily with a meal. 03/13/14   Baird Cancer, PA-C  tamsulosin (FLOMAX) 0.4 MG CAPS capsule Take 1 capsule (0.4 mg total) by mouth daily. 11/17/13   [name], MD   BP 210/138  Pulse 85  Temp(Src) 98.7 F (37.1 C) (Oral)  Resp 24  Ht 5\' 6"  (1.676 m)  Wt 187 lb (84.823 kg)  BMI 30.20 kg/m2  SpO2 95% Physical Exam  Nursing note and  vitals reviewed. Constitutional: He is oriented to person, place, and time. He appears well-developed and well-nourished.  Non-toxic appearance.  HENT:  Head: Normocephalic.  Right Ear: Tympanic membrane and external ear normal.  Left Ear: Tympanic membrane and external ear normal.  Eyes: EOM and lids are normal. Pupils are equal, round, and reactive to light.  Neck: Normal range of motion. Neck supple. Carotid bruit is not present.  Cardiovascular: Normal rate, regular rhythm, normal heart sounds, intact distal pulses and normal pulses.  Exam reveals no gallop and no friction rub.   Occasional premature beat. No rub or gallop.  Pulmonary/Chest: No respiratory distress. He has no wheezes. He has no rales.  Decrease breath sounds present. Pt can not speak in complete sentences.  Abdominal: Soft. Bowel sounds are normal. He exhibits no distension. There is no tenderness. There  is no guarding.  Musculoskeletal: Normal range of motion. He exhibits edema. He exhibits no tenderness.  There is pitting edema of the right and left lower extremities.  Lymphadenopathy:       Head (right side): No submandibular adenopathy present.       Head (left side): No submandibular adenopathy present.    He has no cervical adenopathy.  Neurological: He is alert and oriented to person, place, and time. He has normal strength. No cranial nerve deficit or sensory deficit. Coordination normal.  Skin: Skin is warm and dry.  Psychiatric: He has a normal mood and affect. His speech is normal.    ED Course  Procedures (including critical care time) CRITICAL CARE Performed by: Lenox Ahr Total critical care time: **58min* Critical care time was exclusive of separately billable procedures and treating other patients. Critical care was necessary to treat or prevent imminent or life-threatening deterioration. Critical care was time spent personally by me on the following activities: development of treatment plan  with patient and/or surrogate as well as nursing, discussions with consultants, evaluation of patient's response to treatment, examination of patient, obtaining history from patient or surrogate, ordering and performing treatments and interventions, ordering and review of laboratory studies, ordering and review of radiographic studies, pulse oximetry and re-evaluation of patient's condition. Labs Review Labs Reviewed  URINALYSIS, ROUTINE W REFLEX MICROSCOPIC - Abnormal; Notable for the following:    APPearance HAZY (*)    Hgb urine dipstick SMALL (*)    Protein, ur 100 (*)    All other components within normal limits  URINE MICROSCOPIC-ADD ON - Abnormal; Notable for the following:    Bacteria, UA FEW (*)    Casts GRANULAR CAST (*)    All other components within normal limits  BASIC METABOLIC PANEL  PRO B NATRIURETIC PEPTIDE  TROPONIN I  CBC WITH DIFFERENTIAL    Imaging Review Dg Chest Port 1 View  05/19/2014   CLINICAL DATA:  Shortness of breath.  EXAM: PORTABLE CHEST - 1 VIEW  COMPARISON:  11/24/2013.  FINDINGS: Severe cardiomegaly. Pulmonary vascular prominence. Mild interstitial prominence. These findings consistent with congestive heart failure with interstitial edema. No pleural effusion or pneumothorax.  IMPRESSION: Findings consistent with congestive heart failure and interstitial edema. Similar findings noted on prior study of 11/24/2013.   Electronically Signed   By: Marcello Moores  Register   On: 05/19/2014 09:06     EKG Interpretation   Date/Time:  Sunday May 19 2014 08:18:44 EDT Ventricular Rate:  80 PR Interval:  195 QRS Duration: 116 QT Interval:  484 QTC Calculation: 558 R Axis:   11 Text Interpretation:  Sinus rhythm Ventricular trigeminy Incomplete right  bundle branch block Confirmed by COOK  MD, BRIAN ([id number]) on 05/19/2014  8:26:14 AM      MDM Patient presented initially to the emergency department with tachypnea of 33-34 breaths per minute. An elevated blood  pressures of 258, and 527 systolic. Patient had difficulty speaking in complete sentences due to shortness of breath. The patient was given IV Lasix and oral amlodipine, with some improvement in the tachypnea and the blood pressure. After several rechecks, pt now able to speak in complete sentences. States he feels some better.  Basic metabolic panel shows a potassium be slightly low at 3.4, glucose is elevated at 200, the BUN is elevated at 25, the creatinine is normal at 0.93. The B. natruretic peptide is significantly elevated at 7039. Troponin is normal at less than 0.30. Complete blood count shows the  hemoglobin to be 11.3, low. The hematocrit is 35.3, low. Urinalysis shows a hazy yellow specimen with a specific gravity 1.020. There is a small bout of hemoglobin present, 100 mg per decaliter of protein present, few bacteria and  granular cast being present. The chest x-ray shows findings of congestive heart failure with interstitial edema.  I discussed the findings with the patient and the patient's wife. I also discussed the need for the patient to be admitted to the hospital, and he is in agreement. Case discussed with the triad hospitalists. They will admit the patient to the hospital.    Final diagnoses:  None    *I have reviewed nursing notes, vital signs, and all appropriate lab and imaging results for this patient.Lenox Ahr, PA-C 05/19/14 1032

## 2014-05-20 LAB — BASIC METABOLIC PANEL
Anion gap: 8 (ref 5–15)
BUN: 16 mg/dL (ref 6–23)
CALCIUM: 8.8 mg/dL (ref 8.4–10.5)
CO2: 37 meq/L — AB (ref 19–32)
Chloride: 102 mEq/L (ref 96–112)
Creatinine, Ser: 0.88 mg/dL (ref 0.50–1.35)
GFR calc Af Amer: >90 mL/min (ref >90)
GFR calc non Af Amer: 84 mL/min — ABNORMAL LOW (ref >90)
GLUCOSE: 95 mg/dL (ref 70–99)
Potassium: 3.1 mEq/L — ABNORMAL LOW (ref 3.7–5.3)
SODIUM: 147 meq/L (ref 137–147)

## 2014-05-20 LAB — GLUCOSE, CAPILLARY
GLUCOSE-CAPILLARY: 93 mg/dL (ref 70–99)
GLUCOSE-CAPILLARY: 159 mg/dL — AB (ref 70–99)
GLUCOSE-CAPILLARY: 192 mg/dL — AB (ref 70–99)
Glucose-Capillary: 149 mg/dL — ABNORMAL HIGH (ref 70–99)

## 2014-05-20 LAB — TROPONIN I

## 2014-05-20 MED ORDER — ABIRATERONE ACETATE 250 MG PO TABS
1000.0000 mg | ORAL_TABLET | Freq: Every day | ORAL | Status: DC
  Administered 2014-05-21 – 2014-05-23 (×3): 1000 mg via ORAL
  Filled 2014-05-20 (×3): qty 4

## 2014-05-20 MED ORDER — NON FORMULARY: 1000.0000 mg | Freq: Every morning | Status: DC

## 2014-05-20 MED ORDER — HYDRALAZINE HCL 25 MG PO TABS
50.0000 mg | ORAL_TABLET | Freq: Three times a day (TID) | ORAL | Status: DC
  Administered 2014-05-20 – 2014-05-23 (×9): 50 mg via ORAL
  Filled 2014-05-20 (×9): qty 2

## 2014-05-20 MED ORDER — POTASSIUM CHLORIDE 10 MEQ/100ML IV SOLN
10.0000 meq | INTRAVENOUS | Status: AC
  Administered 2014-05-20 (×4): 10 meq via INTRAVENOUS
  Filled 2014-05-20 (×2): qty 100

## 2014-05-20 NOTE — Care Management Note (Addendum)
    Page 1 of 1   05/23/2014     3:16:54 PM CARE MANAGEMENT NOTE 05/23/2014  Patient:  LUCKY, TROTTA   Account Number:  [id number]  Date Initiated:  05/20/2014  Documentation initiated by:  Vladimir Creeks  Subjective/Objective Assessment:   Pt admitted with CHF. He is from home with spouse, and family. He will return home at D/C.     Action/Plan:   May benefit from [facility] to follow at D/C. He has used [facility] in the past   Anticipated DC Date:  05/23/2014   Anticipated DC Plan:  [place]  CM consult      [place]   Choice offered to / List presented to:  C-1 Patient        [place] arranged  HH-1 RN  [place].   Status of service:  Completed, signed off Medicare Important Message given?  YES (If response is "NO", the following Medicare IM given date fields will be blank) Date Medicare IM given:  05/23/2014 Medicare IM given by:  Vladimir Creeks Date Additional Medicare IM given:   Additional Medicare IM given by:    Discharge Disposition:  [place]  Per UR Regulation:  Reviewed for med. necessity/level of care/duration of stay  If discussed at [place] of Stay Meetings, dates discussed:    Comments:  05/23/14  1300 Kennie Karapetian  RN/CM 05/20/14 [place] RN/CM

## 2014-05-20 NOTE — Progress Notes (Signed)
[id number] 

## 2014-05-21 ENCOUNTER — Other Ambulatory Visit (HOSPITAL_COMMUNITY): Payer: Self-pay | Admitting: Oncology

## 2014-05-21 LAB — BASIC METABOLIC PANEL
Anion gap: 8 (ref 5–15)
BUN: 16 mg/dL (ref 6–23)
CALCIUM: 8.7 mg/dL (ref 8.4–10.5)
CO2: 37 mEq/L — ABNORMAL HIGH (ref 19–32)
Chloride: 101 mEq/L (ref 96–112)
Creatinine, Ser: 0.87 mg/dL (ref 0.50–1.35)
GFR calc Af Amer: >90 mL/min (ref >90)
GFR calc non Af Amer: 85 mL/min — ABNORMAL LOW (ref >90)
GLUCOSE: 94 mg/dL (ref 70–99)
Potassium: 3 mEq/L — ABNORMAL LOW (ref 3.7–5.3)
SODIUM: 146 meq/L (ref 137–147)

## 2014-05-21 LAB — MAGNESIUM: Magnesium: 1.9 mg/dL (ref 1.5–2.5)

## 2014-05-21 LAB — GLUCOSE, CAPILLARY
GLUCOSE-CAPILLARY: 112 mg/dL — AB (ref 70–99)
Glucose-Capillary: 201 mg/dL — ABNORMAL HIGH (ref 70–99)
Glucose-Capillary: 104 mg/dL — ABNORMAL HIGH (ref 70–99)

## 2014-05-21 MED ORDER — POTASSIUM CHLORIDE CRYS ER 20 MEQ PO TBCR
20.0000 meq | EXTENDED_RELEASE_TABLET | Freq: Two times a day (BID) | ORAL | Status: DC
  Administered 2014-05-21 – 2014-05-23 (×5): 20 meq via ORAL
  Filled 2014-05-21 (×5): qty 1

## 2014-05-21 MED ORDER — AMLODIPINE BESYLATE 5 MG PO TABS
5.0000 mg | ORAL_TABLET | Freq: Every day | ORAL | Status: DC
  Administered 2014-05-21 – 2014-05-23 (×3): 5 mg via ORAL
  Filled 2014-05-21 (×3): qty 1

## 2014-05-21 NOTE — Progress Notes (Signed)
[id number] 

## 2014-05-21 NOTE — Progress Notes (Signed)
NAME:  [name], [name] NO.:  [id number]  MEDICAL RECORD NO.:  [id number]  LOCATION:  A328                          FACILITY:  [facility]  PHYSICIAN:  Unk Lightning, MDDATE OF BIRTH:  Oct 13, 1941  DATE OF PROCEDURE: DATE OF DISCHARGE:                                PROGRESS NOTE   The patient has chronic nonischemic cardiomyopathy, ejection fraction 20- 25%; hypertension; insulin-dependent diabetes; chronic noncompliance issues; cognitive dysfunction.  The patient was seen in the office approximately 3 weeks ago, had a systolic of 37/16, had symptoms of dizziness, no orthostatic changes.  His hydralazine was discontinued and subsequently, his Norvasc was discontinued 2-3 days later.  Comes in with again systolic, accelerated hypertension 160-170 range, clinical and radiographic evidence of congestive heart failure, was diuresed with good clinical response, currently hypokalemic at 3.1.  This will be repleted intravenously.  Denies angina or anginal equivalents, he is extremely poor historian.  No evidence of coronary obstructive disease. Echo reveals ejection fraction 20-25% today.  Lungs show diminished breath sounds at the bases.  Scattered rhonchi.  No rales, no wheeze. Heart:  Regular rhythm.  No S3, S4.  No heaves, thrills, or rubs.  Blood pressure 154/96.  Plan right now is to reinstitute hydralazine 25 q.8 hours and then titrate upwards.  We will consider reinstitution of Norvasc depending on clinical response.  We will monitor BMET daily as well as serum magnesium levels due to diuresis.     Unk Lightning, MD     RMD/MEDQ  D:  05/20/2014  T:  05/21/2014  Job:  [id number]

## 2014-05-22 LAB — GLUCOSE, CAPILLARY
GLUCOSE-CAPILLARY: 217 mg/dL — AB (ref 70–99)
GLUCOSE-CAPILLARY: 164 mg/dL — AB (ref 70–99)
Glucose-Capillary: 103 mg/dL — ABNORMAL HIGH (ref 70–99)
Glucose-Capillary: 110 mg/dL — ABNORMAL HIGH (ref 70–99)

## 2014-05-22 LAB — BASIC METABOLIC PANEL
Anion gap: 7 (ref 5–15)
BUN: 18 mg/dL (ref 6–23)
CHLORIDE: 101 meq/L (ref 96–112)
CO2: 35 meq/L — AB (ref 19–32)
CREATININE: 0.86 mg/dL (ref 0.50–1.35)
Calcium: 8.7 mg/dL (ref 8.4–10.5)
GFR calc Af Amer: >90 mL/min (ref >90)
GFR calc non Af Amer: 85 mL/min — ABNORMAL LOW (ref >90)
Glucose, Bld: 120 mg/dL — ABNORMAL HIGH (ref 70–99)
POTASSIUM: 3.2 meq/L — AB (ref 3.7–5.3)
Sodium: 143 mEq/L (ref 137–147)

## 2014-05-22 MED ORDER — POTASSIUM CHLORIDE 10 MEQ/100ML IV SOLN
10.0000 meq | INTRAVENOUS | Status: AC
  Administered 2014-05-22: 10 meq via INTRAVENOUS
  Filled 2014-05-22: qty 100

## 2014-05-22 NOTE — Progress Notes (Signed)
[id number] 

## 2014-05-22 NOTE — Progress Notes (Signed)
NAME:  Oscar Hickman, Oscar Hickman             ACCOUNT NO.:  [id number]  MEDICAL RECORD NO.:  [id number]  LOCATION:  A328                          FACILITY:  [facility]  PHYSICIAN:  Unk Lightning, MDDATE OF BIRTH:  November 30, 1940  DATE OF PROCEDURE: DATE OF DISCHARGE:                                PROGRESS NOTE   SUBJECTIVE:  CHF is resolving, has ejection fraction of 20-25%, and with the addition of Norvasc 5 to hydralazine 50 t.i.d.  OBJECTIVE:  VITAL SIGNS:  Blood pressure 128/78 today. NECK:  No JVD, no carotid bruits.  No thyromegaly. LUNGS:  Diminished breath sounds at bases.  No rales, wheeze, or rhonchi appreciable. HEART:  Regular rhythm.  No S3, S4.  No heaves, thrills, or rubs.  PLAN:  Right now is to continue hemodynamic control.  Potassium down to 3.2, on oral replenishment.  We will add 10 mEq of KCl IV x3.  Check BMET in a.m.     Unk Lightning, MD     RMD/MEDQ  D:  05/22/2014  T:  05/22/2014  Job:  [id number]

## 2014-05-22 NOTE — Progress Notes (Signed)
NAME:  Oscar Hickman, Oscar Hickman NO.:  [id number]  MEDICAL RECORD NO.:  [id number]  LOCATION:  A328                          FACILITY:  [facility]  PHYSICIAN:  Unk Lightning, MDDATE OF BIRTH:  1940-12-31  DATE OF PROCEDURE: DATE OF DISCHARGE:                                PROGRESS NOTE   The patient had hydralazine added 50 t.i.d. yesterday.  Blood pressure 145/85.  The patient still complains of cephalgia, less dyspnea.  No signs or clinical evidence of CHF, EF 20-25% based on echo yesterday. Lungs are clear.  No rales, wheeze, or rhonchi.  Some diminished breath sounds at the base.  Heart, regular, no S3 auscultated.  Plan right now is to resume Norvasc 5 mg.  His potassium is 3.1.  We will add KCl 20 mEq p.o. b.i.d. and check BMET in a.m.     Unk Lightning, MD     RMD/MEDQ  D:  05/21/2014  T:  05/22/2014  Job:  [id number]

## 2014-05-22 NOTE — Progress Notes (Signed)
Patient complain of pain at IV site where potassium is being infused. IV site within normal limits, flushes without difficulty. Blood return noted. Dr. Cindie Laroche notified, new orders to stop potassium IV.

## 2014-05-23 LAB — BASIC METABOLIC PANEL
Anion gap: 7 (ref 5–15)
BUN: 22 mg/dL (ref 6–23)
CALCIUM: 8.9 mg/dL (ref 8.4–10.5)
CHLORIDE: 101 meq/L (ref 96–112)
CO2: 36 meq/L — AB (ref 19–32)
CREATININE: 0.98 mg/dL (ref 0.50–1.35)
GFR calc Af Amer: >90 mL/min (ref >90)
GFR calc non Af Amer: 81 mL/min — ABNORMAL LOW (ref >90)
Glucose, Bld: 100 mg/dL — ABNORMAL HIGH (ref 70–99)
Potassium: 3.5 mEq/L — ABNORMAL LOW (ref 3.7–5.3)
Sodium: 144 mEq/L (ref 137–147)

## 2014-05-23 LAB — GLUCOSE, CAPILLARY
GLUCOSE-CAPILLARY: 155 mg/dL — AB (ref 70–99)
Glucose-Capillary: 192 mg/dL — ABNORMAL HIGH (ref 70–99)
Glucose-Capillary: 98 mg/dL (ref 70–99)

## 2014-05-23 MED ORDER — HYDRALAZINE HCL 50 MG PO TABS: 50.0000 mg | ORAL_TABLET | Freq: Three times a day (TID) | ORAL | Status: DC

## 2014-05-23 MED ORDER — FUROSEMIDE 20 MG PO TABS: 20.0000 mg | ORAL_TABLET | Freq: Every day | ORAL | Status: DC

## 2014-05-23 MED ORDER — PREDNISONE 5 MG PO TABS: 5.0000 mg | ORAL_TABLET | Freq: Two times a day (BID) | ORAL | Status: DC

## 2014-05-23 MED ORDER — POTASSIUM CHLORIDE CRYS ER 20 MEQ PO TBCR: 20.0000 meq | EXTENDED_RELEASE_TABLET | Freq: Two times a day (BID) | ORAL | Status: DC

## 2014-05-23 MED ORDER — AMLODIPINE BESYLATE 5 MG PO TABS: 5.0000 mg | ORAL_TABLET | Freq: Every day | ORAL | Status: DC

## 2014-05-23 NOTE — Progress Notes (Signed)
Patient with orders to be discharge home. Discharge instructions given, patient verbalized understanding. Patient stable. Patient left with family in private vehicle.  

## 2014-05-24 NOTE — Discharge Summary (Signed)
NAME:  Oscar Hickman, GAUTREAU NO.:  [id number]  MEDICAL RECORD NO.:  [id number]  LOCATION:  A328                          FACILITY:  [facility]  PHYSICIAN:  Unk Lightning, MDDATE OF BIRTH:  12-20-1940  DATE OF ADMISSION:  05/19/2014 DATE OF DISCHARGE:  07/16/2015LH                              DISCHARGE SUMMARY   The patient is a 73 year old, black male, chronic noncompliance, insulin- dependent diabetes status post TURP for bladder outlet obstruction, hypertension, hyperlipidemia, has a nonischemic cardiomyopathy, ejection fraction 20% to 25%, currently in sinus rhythm, not anticoagulated except on antiplatelet therapy, had hypotension in the office.  In the office he had 2 of his 5 antihypertensives removed.  Came in with accelerated hypertension 3 weeks later with congestive heart failure clinically and radiographically.  Cardiac enzymes were negative, after load reduction was administered in the form of hydralazine as well as re- addition of Norvasc 5 mg per day.  His congestive heart failure resolved and he was not dyspneic, not orthopneic, and lungs were clear.  He was felt to be stable for discharge.  He had problems with hypokalemia. While in the hospital, he was given increased diuresis, however, it was elected to discharge him on potassium chloride 20 mEq p.o. daily, and to recheck his potassium and renal function within 3-7 days post discharge.  DISCHARGE MEDICINES: 1. Norvasc 5 mg p.o. daily. 2. Hydralazine 50 mg p.o. q.8. 3. K-Lor 20 mEq p.o. daily. 4. Prednisone 5 mg p.o. daily. 5. Zytiga 250 mg 4 tablets daily. 6. Proventil HFA 2 puffs q.i.d. 7. Aspirin 81 mg per day. 8. Coreg 25 mg p.o. b.i.d. 9. Clonidine 0.2 mg p.o. t.i.d. 10.Lantus 26 units subcu nightly. 11.Isordil 20 mg p.o. daily. 12.Metformin 500 mg p.o. b.i.d. 13.Flomax 0.4 mg p.o. daily. The patient will follow up in the office in 1 week's time.    Unk Lightning,  MD    RMD/MEDQ  D:  05/23/2014  T:  05/24/2014  Job:  [id number]

## 2014-05-24 NOTE — Progress Notes (Signed)
UR chart review completed.  

## 2014-06-28 ENCOUNTER — Telehealth (HOSPITAL_COMMUNITY): Payer: Self-pay | Admitting: *Deleted

## 2014-06-28 ENCOUNTER — Other Ambulatory Visit (HOSPITAL_COMMUNITY): Payer: Self-pay | Admitting: Oncology

## 2014-06-28 DIAGNOSIS — C61 Malignant neoplasm of prostate: Secondary | ICD-10-CM

## 2014-06-28 MED ORDER — ABIRATERONE ACETATE 250 MG PO TABS: 1000.0000 mg | ORAL_TABLET | Freq: Every day | ORAL | Status: DC

## 2014-07-02 ENCOUNTER — Encounter (HOSPITAL_COMMUNITY): Payer: Self-pay | Admitting: Emergency Medicine

## 2014-07-02 ENCOUNTER — Emergency Department (HOSPITAL_COMMUNITY)
Admission: EM | Admit: 2014-07-02 | Discharge: 2014-07-02 | Disposition: A | Discharge status: Home / Self Care | Payer: Medicare Other | Attending: Emergency Medicine | Admitting: Emergency Medicine

## 2014-07-02 ENCOUNTER — Emergency Department (HOSPITAL_COMMUNITY): Payer: Medicare Other

## 2014-07-02 DIAGNOSIS — I5033 Acute on chronic diastolic (congestive) heart failure: Secondary | ICD-10-CM | POA: Insufficient documentation

## 2014-07-02 DIAGNOSIS — IMO0002 Reserved for concepts with insufficient information to code with codable children: Secondary | ICD-10-CM | POA: Diagnosis not present

## 2014-07-02 DIAGNOSIS — Z8669 Personal history of other diseases of the nervous system and sense organs: Secondary | ICD-10-CM | POA: Diagnosis not present

## 2014-07-02 DIAGNOSIS — M7989 Other specified soft tissue disorders: Secondary | ICD-10-CM | POA: Insufficient documentation

## 2014-07-02 DIAGNOSIS — F411 Generalized anxiety disorder: Secondary | ICD-10-CM | POA: Diagnosis not present

## 2014-07-02 DIAGNOSIS — Z79899 Other long term (current) drug therapy: Secondary | ICD-10-CM | POA: Diagnosis not present

## 2014-07-02 DIAGNOSIS — Z794 Long term (current) use of insulin: Secondary | ICD-10-CM | POA: Insufficient documentation

## 2014-07-02 DIAGNOSIS — I10 Essential (primary) hypertension: Secondary | ICD-10-CM | POA: Diagnosis not present

## 2014-07-02 DIAGNOSIS — E119 Type 2 diabetes mellitus without complications: Secondary | ICD-10-CM | POA: Insufficient documentation

## 2014-07-02 DIAGNOSIS — Z8546 Personal history of malignant neoplasm of prostate: Secondary | ICD-10-CM | POA: Diagnosis not present

## 2014-07-02 DIAGNOSIS — Z872 Personal history of diseases of the skin and subcutaneous tissue: Secondary | ICD-10-CM | POA: Insufficient documentation

## 2014-07-02 DIAGNOSIS — Z9889 Other specified postprocedural states: Secondary | ICD-10-CM | POA: Diagnosis not present

## 2014-07-02 DIAGNOSIS — Z91199 Patient's noncompliance with other medical treatment and regimen due to unspecified reason: Secondary | ICD-10-CM | POA: Diagnosis not present

## 2014-07-02 DIAGNOSIS — M1990 Unspecified osteoarthritis, unspecified site: Secondary | ICD-10-CM | POA: Insufficient documentation

## 2014-07-02 DIAGNOSIS — Z9119 Patient's noncompliance with other medical treatment and regimen: Secondary | ICD-10-CM | POA: Insufficient documentation

## 2014-07-02 DIAGNOSIS — G8929 Other chronic pain: Secondary | ICD-10-CM | POA: Diagnosis not present

## 2014-07-02 DIAGNOSIS — J4489 Other specified chronic obstructive pulmonary disease: Secondary | ICD-10-CM | POA: Insufficient documentation

## 2014-07-02 DIAGNOSIS — Z7982 Long term (current) use of aspirin: Secondary | ICD-10-CM | POA: Insufficient documentation

## 2014-07-02 DIAGNOSIS — R143 Flatulence: Secondary | ICD-10-CM

## 2014-07-02 DIAGNOSIS — J449 Chronic obstructive pulmonary disease, unspecified: Secondary | ICD-10-CM | POA: Insufficient documentation

## 2014-07-02 DIAGNOSIS — R141 Gas pain: Secondary | ICD-10-CM | POA: Insufficient documentation

## 2014-07-02 DIAGNOSIS — R142 Eructation: Secondary | ICD-10-CM

## 2014-07-02 LAB — BASIC METABOLIC PANEL
ANION GAP: 10 (ref 5–15)
BUN: 21 mg/dL (ref 6–23)
CO2: 30 meq/L (ref 19–32)
CREATININE: 0.84 mg/dL (ref 0.50–1.35)
Calcium: 8.7 mg/dL (ref 8.4–10.5)
Chloride: 104 mEq/L (ref 96–112)
GFR calc non Af Amer: 85 mL/min — ABNORMAL LOW (ref >90)
Glucose, Bld: 189 mg/dL — ABNORMAL HIGH (ref 70–99)
Potassium: 4.1 mEq/L (ref 3.7–5.3)
Sodium: 144 mEq/L (ref 137–147)

## 2014-07-02 LAB — CBC WITH DIFFERENTIAL/PLATELET
Basophils Absolute: 0 10*3/uL (ref 0.0–0.1)
Basophils Relative: 0 % (ref 0–1)
EOS ABS: 0.1 10*3/uL (ref 0.0–0.7)
Eosinophils Relative: 1 % (ref 0–5)
HCT: 32.7 % — ABNORMAL LOW (ref 39.0–52.0)
Hemoglobin: 10.4 g/dL — ABNORMAL LOW (ref 13.0–17.0)
LYMPHS ABS: 0.7 10*3/uL (ref 0.7–4.0)
Lymphocytes Relative: 10 % — ABNORMAL LOW (ref 12–46)
MCH: 26 pg (ref 26.0–34.0)
MCHC: 31.8 g/dL (ref 30.0–36.0)
MCV: 81.8 fL (ref 78.0–100.0)
MONOS PCT: 9 % (ref 3–12)
Monocytes Absolute: 0.6 10*3/uL (ref 0.1–1.0)
NEUTROS ABS: 5 10*3/uL (ref 1.7–7.7)
Neutrophils Relative %: 80 % — ABNORMAL HIGH (ref 43–77)
PLATELETS: 359 10*3/uL (ref 150–400)
RBC: 4 MIL/uL — AB (ref 4.22–5.81)
RDW: 17.9 % — ABNORMAL HIGH (ref 11.5–15.5)
WBC: 6.3 10*3/uL (ref 4.0–10.5)

## 2014-07-02 LAB — TROPONIN I

## 2014-07-02 LAB — PRO B NATRIURETIC PEPTIDE: PRO B NATRI PEPTIDE: 5754 pg/mL — AB (ref 0–125)

## 2014-07-02 MED ORDER — FUROSEMIDE 10 MG/ML IJ SOLN
40.0000 mg | Freq: Once | INTRAMUSCULAR | Status: AC
  Administered 2014-07-02: 40 mg via INTRAVENOUS
  Filled 2014-07-02: qty 4

## 2014-07-02 MED ORDER — FUROSEMIDE 20 MG PO TABS: 20.0000 mg | ORAL_TABLET | Freq: Two times a day (BID) | ORAL | Status: DC

## 2014-07-02 NOTE — ED Provider Notes (Signed)
CSN: [id number]     Arrival date & time 07/02/14  1158 History  This chart was scribed for [employer]. Alvino Chapel, MD by Einar Pheasant, ED Scribe. This patient was seen in room APA19/AP[facility]19 and the patient's care was started at 1:16 PM.    Chief Complaint  Patient presents with  . Leg Swelling    HPI HPI Comments: Oscar Hickman is a 73 y.o. male who presents to the Emergency Department complaining of bilateral ankle swelling that started today PTA. Pt states that he has been experiencing bilateral ankle swelling and abdominal distention for a while and was told by his home nurst to come to the ED. He states that normally he does have bilateral leg swelling, but it usually goes down on its own. Pt does report a productive cough with clear sputum. Wife states that the pt has decreased his daily activities at home; he stays in the the bed most of the day. He denies any chest pain, SOB, fever, chills, dysuria, or diaphoresis.   Past Medical History  Diagnosis Date  . Skin infection     history  . HTN (hypertension) 11/23/2011  . High cholesterol   . Diabetes mellitus   . Chronic headache     daily  . Anxiety   . Osteoarthritis   . COPD (chronic obstructive pulmonary disease)     patient denies  . Peripheral neuropathy   . 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 CHF (congestive heart failure)   . Medical non-compliance   . Heart disease   . Cognitive dysfunction    Past Surgical History  Procedure Laterality Date  . Transurethral resection of prostate  03/2008  . Cardiac catheterization  02/2009    minimal non-obstructive CAD  . Cystoscopy w/ ureteral stent placement Right 04/06/2013    Procedure: CYSTOSCOPY WITH RETROGRADE PYELOGRAM/URETERAL STENT PLACEMENT;  Surgeon: Marissa Nestle, MD;  Location: [facility];  Service: Urology;  Laterality: Right;  . Cystoscopy w/ ureteral stent removal Right 04/06/2013    Procedure: CYSTOSCOPY WITH STENT REMOVAL;   Surgeon: Marissa Nestle, MD;  Location: [facility];  Service: Urology;  Laterality: Right;   Family History  Problem Relation Age of Onset  . Cancer Mother   . Cancer Father    History  Substance Use Topics  . Smoking status: Never Smoker   . Smokeless tobacco: Never Used  . Alcohol Use: No    Review of Systems    Allergies  Review of patient's allergies indicates no known allergies.  Home Medications   Prior to Admission medications   Medication Sig Start Date End Date Taking? Authorizing Provider  abiraterone Acetate (ZYTIGA) 250 MG tablet Take 4 tablets (1,000 mg total) by mouth daily. Take on an empty stomach 1 hour before or 2 hours after a meal 06/28/14  Yes Manon Hilding [name], PA-C  albuterol (PROVENTIL) (2.5 MG/3ML) 0.083% nebulizer solution Take 3 mLs (2.5 mg total) by nebulization every 2 (two) hours as needed for wheezing or shortness of breath. 11/29/13  Yes [name], MD  amLODipine (NORVASC) 5 MG tablet Take 1 tablet (5 mg total) by mouth daily. 05/23/14  Yes [name], MD  aspirin EC 81 MG tablet Take 81 mg by mouth daily.   Yes Historical Provider, MD  carvedilol (COREG) 25 MG tablet Take 25 mg by mouth 2 (two) times daily with a meal.   Yes Historical Provider, MD  cloNIDine (CATAPRES) 0.2 MG  tablet Take 1 tablet (0.2 mg total) by mouth 2 (two) times daily. 11/17/13  Yes [name], MD  insulin glargine (LANTUS) 100 UNIT/ML injection Inject 25-26 Units into the skin daily.    Yes Historical Provider, MD  isosorbide dinitrate (ISORDIL) 20 MG tablet Take 20 mg by mouth 3 (three) times daily.  06/18/11  Yes Historical Provider, MD  metFORMIN (GLUCOPHAGE) 500 MG tablet Take 500 mg by mouth 2 (two) times daily with a meal.    Yes Historical Provider, MD  potassium chloride SA (K-DUR,KLOR-CON) 20 MEQ tablet Take 20 mEq by mouth daily.   Yes Historical Provider, MD  predniSONE (DELTASONE) 5 MG tablet Take 1 tablet (5 mg total) by mouth 2 (two) times  daily with a meal. 05/23/14  Yes [name], MD  tamsulosin (FLOMAX) 0.4 MG CAPS capsule Take 1 capsule (0.4 mg total) by mouth daily. 11/17/13  Yes [name], MD  furosemide (LASIX) 20 MG tablet Take 1 tablet (20 mg total) by mouth 2 (two) times daily. 07/02/14 07/05/14  Jasper Riling. Landy Mace, MD   BP 155/101  Pulse 88  Temp(Src) 98.2 F (36.8 C) (Oral)  Resp 26  Ht 5\' 7"  (1.702 m)  Wt 180 lb (81.647 kg)  BMI 28.19 kg/m2  SpO2 99%  Physical Exam  Nursing note and vitals reviewed. Constitutional: He is oriented to person, place, and time. He appears well-developed and well-nourished. No distress.  HENT:  Head: Normocephalic and atraumatic.  Face is symmetric.  Eyes: Conjunctivae and EOM are normal.  Normal appearance  Neck: Normal range of motion. Neck supple. No JVD present.  Cardiovascular: Normal rate and regular rhythm.  Exam reveals no gallop and no friction rub.   No murmur heard. Pulmonary/Chest: Effort normal and breath sounds normal. No respiratory distress. He has no wheezes. He has no rales.  Abdominal: Soft. Bowel sounds are normal. He exhibits distension (mild). There is no tenderness. There is no rebound.  Musculoskeletal: Normal range of motion.  Bilateral pitting edema to lower extremities.  Neurological: He is alert and oriented to person, place, and time.  Skin: Skin is warm and dry.  Psychiatric: He has a normal mood and affect. His behavior is normal.    ED Course  Procedures (including critical care time)  1:22 PM- Pt and family advised of plan for treatment and pt and family agree.  Results for orders placed during the hospital encounter of 07/02/14  CBC WITH DIFFERENTIAL      Result Value Ref Range   WBC 6.3  4.0 - 10.5 K/uL   RBC 4.00 (*) 4.22 - 5.81 MIL/uL   Hemoglobin 10.4 (*) 13.0 - 17.0 g/dL   HCT 32.7 (*) 39.0 - 52.0 %   MCV 81.8  78.0 - 100.0 fL   MCH 26.0  26.0 - 34.0 pg   MCHC 31.8  30.0 - 36.0 g/dL   RDW 17.9 (*) 11.5 - 15.5  %   Platelets 359  150 - 400 K/uL   Neutrophils Relative % 80 (*) 43 - 77 %   Neutro Abs 5.0  1.7 - 7.7 K/uL   Lymphocytes Relative 10 (*) 12 - 46 %   Lymphs Abs 0.7  0.7 - 4.0 K/uL   Monocytes Relative 9  3 - 12 %   Monocytes Absolute 0.6  0.1 - 1.0 K/uL   Eosinophils Relative 1  0 - 5 %   Eosinophils Absolute 0.1  0.0 - 0.7 K/uL   Basophils Relative  0  0 - 1 %   Basophils Absolute 0.0  0.0 - 0.1 K/uL  TROPONIN I      Result Value Ref Range   Troponin I <0.30  <0.30 ng/mL  PRO B NATRIURETIC PEPTIDE      Result Value Ref Range   Pro B Natriuretic peptide (BNP) 5754.0 (*) 0 - 125 pg/mL  BASIC METABOLIC PANEL      Result Value Ref Range   Sodium 144  137 - 147 mEq/L   Potassium 4.1  3.7 - 5.3 mEq/L   Chloride 104  96 - 112 mEq/L   CO2 30  19 - 32 mEq/L   Glucose, Bld 189 (*) 70 - 99 mg/dL   BUN 21  6 - 23 mg/dL   Creatinine, Ser 0.84  0.50 - 1.35 mg/dL   Calcium 8.7  8.4 - 10.5 mg/dL   GFR calc non Af Amer 85 (*) >90 mL/min   GFR calc Af Amer >90  >90 mL/min   Anion gap 10  5 - 15   Dg Chest 2 View  07/02/2014   CLINICAL DATA:  Difficulty breathing and lower extremity edema  EXAM: CHEST  2 VIEW  COMPARISON:  Chest CT May 10, 2013 and May 19, 2014 chest radiograph  FINDINGS: There is evidence suggesting a degree of underlying emphysematous change. There is no lung edema or consolidation. Heart is enlarged. There is diffuse prominence of the pulmonary arteries. The appearance raises question of Eisenmenger's physiology. No adenopathy is appreciable. No bone lesions.  IMPRESSION: Cardiomegaly with diffuse pulmonary arterial prominence. Question Eisenmenger's physiology ; in this regard, question whether there is any clinical or echocardiographic evidence to suggest a right to left intracardiac shunt. It is possible that the current changes are due to cor pulmonale, although the more peripheral pulmonary arteries in the lower lobes appear more prominent than is generally seen with classic  pulmonary arterial hypertension. Note that there is underlying emphysematous change. No lung edema or consolidation.   Electronically Signed   By: Lowella Grip M.D.   On: 07/02/2014 12:56      MDM   Final diagnoses:  Acute on chronic diastolic congestive heart failure    Patient has acute on chronic swelling of his legs. History of CHF. Patient is not hypoxic. He has a history of noncompliance. He is not very hypertensive at this time. Discussed with his PCP, Dr. Lorriane Shire. Will give IV Lasix here and increase his Lasix for home. Patient was seen in the office the next couple days does not appear to require admission at this time  I personally performed the services described in this documentation, which was scribed in my presence. The recorded information has been reviewed and is accurate.    Jasper Riling. Alvino Chapel, MD 07/02/14 2142

## 2014-07-02 NOTE — ED Notes (Signed)
Pt reports fluid build up in ankles and in abdominal region. Pt reports this problem has been on and off for a while.  Pt has swelling in ankles. Pt is taking short rapid breaths.

## 2014-07-02 NOTE — Discharge Instructions (Signed)

## 2014-07-16 ENCOUNTER — Ambulatory Visit (HOSPITAL_COMMUNITY): Payer: Medicare Other

## 2014-07-16 ENCOUNTER — Other Ambulatory Visit (HOSPITAL_COMMUNITY): Payer: Medicare Other

## 2014-07-17 NOTE — Progress Notes (Signed)
This encounter was created in error - please disregard.

## 2014-07-23 NOTE — Progress Notes (Signed)
[name], MD [address] [place] [address]  ADENOCARCINOMA, PROSTATE - Plan: leuprolide (LUPRON) injection 22.5 mg  CURRENT THERAPY: Depo Lupron 22.5 mg every 3 months intramuscularly, Zytiga/prednisone daily started on 07/06/2012.   INTERVAL HISTORY: Oscar Hickman 73 y.o. male returns for  regular  visit for followup of Zytiga 1000 mg daily with prednisone 5 mg twice a day for metastatic castrate resistant prostate cancer, status post TURP therapy in 2009 followed by external beam radiotherapy.  I personally reviewed and went over laboratory results with the patient.  The results are noted within this dictation. It is noted the patient's PSA is up to 0.95 in June of 2015 compared to 0.75 in March 2015, 0.29 in December 2014, and 0.17 in September 2014.   We'll continue with the same chemotherapy regimen at this time.  Chart is reviewed and hospitalizations are noted. Patient does have congestive heart failure.  Patient complains of abdominal distention and bilateral lower extremity edema. On exam, his abdominal distention is unimpressive. His abdomen is very soft to palpation. His bilateral lower extremity edema is impressive at 3+ pitting. I suspect this is secondary to cardiac issues. I will therefore address with his diuretic regimen and defer further treatment to his primary care provider, Dr. Cindie Laroche.  He refused the influenza vaccine today.  Oncologically, he denies any complaints and ROS questioning is negative, otherwise.  Past Medical History  Diagnosis Date  . Skin infection     history  . HTN (hypertension) 11/23/2011  . High cholesterol   . Diabetes mellitus   . Chronic headache     daily  . Anxiety   . Osteoarthritis   . COPD (chronic obstructive pulmonary disease)     patient denies  . Peripheral neuropathy   . 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 CHF (congestive heart failure)   . Medical non-compliance    . Heart disease   . Cognitive dysfunction     has ADENOCARCINOMA, PROSTATE; DIABETES MELLITUS, TYPE II; HYPERLIPIDEMIA; HTN (hypertension); Cardiomyopathy, nonischemic; Pyelonephritis; Hydronephrosis of right kidney; Acute on chronic systolic CHF (congestive heart failure); Acute CHF; CHF (congestive heart failure); CHF (congestive heart failure), NYHA class I; Acute systolic CHF (congestive heart failure); and Accelerated hypertension on his problem list.     has No Known Allergies.  Oscar Hickman does not currently have medications on file.  Past Surgical History  Procedure Laterality Date  . Transurethral resection of prostate  03/2008  . Cardiac catheterization  02/2009    minimal non-obstructive CAD  . Cystoscopy w/ ureteral stent placement Right 04/06/2013    Procedure: CYSTOSCOPY WITH RETROGRADE PYELOGRAM/URETERAL STENT PLACEMENT;  Surgeon: Marissa Nestle, MD;  Location: [facility];  Service: Urology;  Laterality: Right;  . Cystoscopy w/ ureteral stent removal Right 04/06/2013    Procedure: CYSTOSCOPY WITH STENT REMOVAL;  Surgeon: Marissa Nestle, MD;  Location: [facility];  Service: Urology;  Laterality: Right;    Denies any headaches, dizziness, double vision, fevers, chills, night sweats, nausea, vomiting, diarrhea, constipation, chest pain, heart palpitations, shortness of breath, blood in stool, black tarry stool, urinary pain, urinary burning, urinary frequency, hematuria.   PHYSICAL EXAMINATION  ECOG PERFORMANCE STATUS: 1 - Symptomatic but completely ambulatory  Filed Vitals:   07/25/14 1431  BP: 149/77  Pulse: 83  Temp: 98.5 F (36.9 C)  Resp: 22    GENERAL:alert, no distress, well nourished, well developed, comfortable, cooperative, obese and smiling SKIN:  skin color, texture, turgor are normal, no rashes or significant lesions HEAD: Normocephalic, No masses, lesions, tenderness or abnormalities EYES: normal, PERRLA, EOMI, Conjunctiva are pink and non-injected EARS:  External ears normal OROPHARYNX:lips, buccal mucosa, and tongue normal and mucous membranes are moist  NECK: supple, no adenopathy, thyroid normal size, non-tender, without nodularity, no stridor, non-tender, trachea midline LYMPH:  no palpable lymphadenopathy, no hepatosplenomegaly BREAST:not examined LUNGS: clear to auscultation and percussion HEART: regular rate & rhythm, no murmurs, no gallops, S1 normal and S2 normal ABDOMEN:abdomen soft, non-tender, obese, normal bowel sounds and no masses or organomegaly BACK: Back symmetric, no curvature. EXTREMITIES:less then 2 second capillary refill, no joint deformities, effusion, or inflammation, no skin discoloration, no clubbing, no cyanosis, positive findings:  edema 3+ pitting edema bilaterally in lower extremities.  NEURO: alert & oriented x 3 with fluent speech, no focal motor/sensory deficits, gait normal   LABORATORY DATA: CBC    Component Value Date/Time   WBC 6.3 07/02/2014 1326   RBC 4.00* 07/02/2014 1326   RBC 3.67* 04/06/2013 1357   HGB 10.4* 07/02/2014 1326   HCT 32.7* 07/02/2014 1326   PLT 359 07/02/2014 1326   MCV 81.8 07/02/2014 1326   MCH 26.0 07/02/2014 1326   MCHC 31.8 07/02/2014 1326   RDW 17.9* 07/02/2014 1326   LYMPHSABS 0.7 07/02/2014 1326   MONOABS 0.6 07/02/2014 1326   EOSABS 0.1 07/02/2014 1326   BASOSABS 0.0 07/02/2014 1326      Chemistry      Component Value Date/Time   NA 144 07/02/2014 1326   K 4.1 07/02/2014 1326   CL 104 07/02/2014 1326   CO2 30 07/02/2014 1326   BUN 21 07/02/2014 1326   CREATININE 0.84 07/02/2014 1326      Component Value Date/Time   CALCIUM 8.7 07/02/2014 1326   ALKPHOS 86 04/23/2014 1018   AST 16 04/23/2014 1018   ALT 13 04/23/2014 1018   BILITOT 0.6 04/23/2014 1018     Lab Results  Component Value Date   PSA 0.95 04/23/2014   PSA 0.75 01/21/2014   PSA 0.29 10/19/2013   Lab Results  Component Value Date   TESTOSTERONE 14* 04/23/2014      ASSESSMENT:  1.Recurrent poorly  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 Indwelling Foley catheter removed. S/P Depo-Lupro and Casodex. 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 S/P Depo-Lupon 22.5 mg every 12 weeks and Casodex daily with failure in therapy which lead to a discontinuation of this therapy to switched to Zytiga/Prednisone starting on 07/06/2012. Depo-Lupron was appropriately restarted on 04/19/2013.  2. 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 DM  4. HTN  5. Mild dementia  6. LE edema, secondary to #2  Patient Active Problem List   Diagnosis Date Noted  . CHF (congestive heart failure), NYHA class I 05/19/2014  . Acute systolic CHF (congestive heart failure) 05/19/2014  . Accelerated hypertension 05/19/2014  . CHF (congestive heart failure) 11/24/2013  . Acute CHF 11/13/2013  . Acute on chronic systolic CHF (congestive heart failure) 05/10/2013  . Pyelonephritis 04/05/2013  . Hydronephrosis of right kidney 04/05/2013  . Cardiomyopathy, nonischemic 02/28/2012  . HTN (hypertension) 11/23/2011  . DIABETES MELLITUS, TYPE II 09/21/2010  . HYPERLIPIDEMIA 09/21/2010  . ADENOCARCINOMA, PROSTATE 06/22/2010     PLAN:  1. I personally reviewed and went over laboratory results with the patient.  The results are noted within this dictation. 2. Depo-Lupron  today and in 3 months 3. Continue Zytiga/Prednisone 4. Increase Lasix to 80 mg in AM x 4 days.   5. Weigh daily 6. Report to Dr. Cindie Laroche Lasix change for 4 days and then future directions following 4 days of 80 mg daily. 7. Refused Return in 3 months for follow-up   THERAPY PLAN:  Depo Lupron today as planned and continue every 3 months.  Continue Zytiga/Prednisone daily as prescribed.  PSA noted to increase, but still below 1.  If biochemical failure is noted, exchanging Zytiga/Prednisone for Xtandi or the addition of Xtandi to current  regimen (currently under study) would be options.  All questions were answered. The patient knows to call the clinic with any problems, questions or concerns. We can certainly see the patient much sooner if necessary.  Patient and plan discussed with Dr. Farrel Gobble and he is in agreement with the aforementioned.   [name],[name] 07/25/2014

## 2014-07-25 ENCOUNTER — Encounter (HOSPITAL_COMMUNITY): Payer: Self-pay | Admitting: Oncology

## 2014-07-25 ENCOUNTER — Encounter (HOSPITAL_COMMUNITY): Payer: Medicare Other | Attending: Oncology | Admitting: Oncology

## 2014-07-25 ENCOUNTER — Encounter (HOSPITAL_COMMUNITY): Payer: Medicare Other

## 2014-07-25 VITALS — BP 149/77 | HR 83 | Temp 98.5°F | Resp 22 | Wt 186.0 lb

## 2014-07-25 DIAGNOSIS — I10 Essential (primary) hypertension: Secondary | ICD-10-CM | POA: Insufficient documentation

## 2014-07-25 DIAGNOSIS — I509 Heart failure, unspecified: Secondary | ICD-10-CM | POA: Insufficient documentation

## 2014-07-25 DIAGNOSIS — E785 Hyperlipidemia, unspecified: Secondary | ICD-10-CM | POA: Diagnosis not present

## 2014-07-25 DIAGNOSIS — E119 Type 2 diabetes mellitus without complications: Secondary | ICD-10-CM | POA: Diagnosis not present

## 2014-07-25 DIAGNOSIS — C61 Malignant neoplasm of prostate: Secondary | ICD-10-CM

## 2014-07-25 DIAGNOSIS — I5022 Chronic systolic (congestive) heart failure: Secondary | ICD-10-CM | POA: Insufficient documentation

## 2014-07-25 DIAGNOSIS — I428 Other cardiomyopathies: Secondary | ICD-10-CM | POA: Insufficient documentation

## 2014-07-25 DIAGNOSIS — J449 Chronic obstructive pulmonary disease, unspecified: Secondary | ICD-10-CM | POA: Diagnosis not present

## 2014-07-25 DIAGNOSIS — Z79899 Other long term (current) drug therapy: Secondary | ICD-10-CM | POA: Diagnosis not present

## 2014-07-25 DIAGNOSIS — R609 Edema, unspecified: Secondary | ICD-10-CM

## 2014-07-25 DIAGNOSIS — J4489 Other specified chronic obstructive pulmonary disease: Secondary | ICD-10-CM | POA: Insufficient documentation

## 2014-07-25 LAB — CBC WITH DIFFERENTIAL/PLATELET
BASOS ABS: 0 10*3/uL (ref 0.0–0.1)
Basophils Relative: 0 % (ref 0–1)
Eosinophils Absolute: 0.1 10*3/uL (ref 0.0–0.7)
Eosinophils Relative: 1 % (ref 0–5)
HCT: 31.7 % — ABNORMAL LOW (ref 39.0–52.0)
HEMOGLOBIN: 9.8 g/dL — AB (ref 13.0–17.0)
LYMPHS PCT: 16 % (ref 12–46)
Lymphs Abs: 0.9 10*3/uL (ref 0.7–4.0)
MCH: 25.5 pg — ABNORMAL LOW (ref 26.0–34.0)
MCHC: 30.9 g/dL (ref 30.0–36.0)
MCV: 82.3 fL (ref 78.0–100.0)
Monocytes Absolute: 0.6 10*3/uL (ref 0.1–1.0)
Monocytes Relative: 11 % (ref 3–12)
NEUTROS ABS: 4.1 10*3/uL (ref 1.7–7.7)
NEUTROS PCT: 72 % (ref 43–77)
Platelets: 345 10*3/uL (ref 150–400)
RBC: 3.85 MIL/uL — ABNORMAL LOW (ref 4.22–5.81)
RDW: 18.2 % — ABNORMAL HIGH (ref 11.5–15.5)
WBC: 5.7 10*3/uL (ref 4.0–10.5)

## 2014-07-25 LAB — COMPREHENSIVE METABOLIC PANEL
ALK PHOS: 75 U/L (ref 39–117)
ALT: 15 U/L (ref 0–53)
AST: 16 U/L (ref 0–37)
Albumin: 2.5 g/dL — ABNORMAL LOW (ref 3.5–5.2)
Anion gap: 8 (ref 5–15)
BILIRUBIN TOTAL: 0.5 mg/dL (ref 0.3–1.2)
BUN: 24 mg/dL — ABNORMAL HIGH (ref 6–23)
CHLORIDE: 110 meq/L (ref 96–112)
CO2: 30 meq/L (ref 19–32)
Calcium: 8.7 mg/dL (ref 8.4–10.5)
Creatinine, Ser: 0.95 mg/dL (ref 0.50–1.35)
GFR calc Af Amer: >90 mL/min (ref >90)
GFR calc non Af Amer: 81 mL/min — ABNORMAL LOW (ref >90)
Glucose, Bld: 120 mg/dL — ABNORMAL HIGH (ref 70–99)
POTASSIUM: 4 meq/L (ref 3.7–5.3)
Sodium: 148 mEq/L — ABNORMAL HIGH (ref 137–147)
Total Protein: 6 g/dL (ref 6.0–8.3)

## 2014-07-25 MED ORDER — LEUPROLIDE ACETATE (3 MONTH) 22.5 MG IM KIT
22.5000 mg | PACK | Freq: Once | INTRAMUSCULAR | Status: AC
  Administered 2014-07-25: 22.5 mg via INTRAMUSCULAR
  Filled 2014-07-25: qty 22.5

## 2014-07-25 NOTE — Progress Notes (Signed)
STAR Program Physical Impairment and Functional Assessment Screening Tool  1. Are you having any pain, including headaches, joint pain, or muscle pain (upper body = OT; lower body = PT)?  Yes, but I hand this before my cancer diagnosis.  2. Do your hands and/or feet feel numb or tingle (PT)?  Yes, but I hand this before my cancer diagnosis.  3. Does any part of your body feel swollen or larger than usual (upper body = OT; lower body = PT)?  Yes, this started after my diagnosis and is still a problem.  4. Are you so tired that you cannot do the things you want or need to do (PT or OT)?  Yes, this started after my diagnosis and is still a problem.  5. Are you feeling weak or are you having trouble moving any part of your body (PT/OT)?  Yes, this started after my diagnosis and is still a problem.  6. Are you having trouble concentrating, thinking, or remembering things (OT/ST)?  No  7. Are you having trouble moving around or feel like you might trip or fall (PT)?  No  8. Are you having trouble swallowing (ST)?  No  9. Are you having trouble speaking (ST)?  No  10. Are you having trouble with going or getting to the bathroom (OT)?  Yes, this started after my diagnosis and is still a problem.  11. Are you having trouble with your sexual function (OT)?  No  12. Are you having trouble lifting things, even just your arms (OT/PT)?  No  13. Are you having trouble taking care of yourself as in dressing or bathing (OT)?  No  14. Are you having trouble with daily tasks like chores or shopping (OT)?  N/A  15. Are you having trouble driving (OT)?  N/A  16. Are you having trouble returning to work or completing your tasks at work (OT)?  N/A  Other concerns:    Legend: OT = Occupational Therapy PT = Physical Therapy ST = Speech Therapy

## 2014-07-25 NOTE — Addendum Note (Signed)
Addended by: Mellissa Kohut on: 07/25/2014 06:04 PM   Modules accepted: Orders

## 2014-07-25 NOTE — Progress Notes (Signed)
Oscar Hickman presents today for injection per MD orders. Depo lupron 22.5mg    administered IM z-track right gluteal Administration without incident. Patient tolerated well.

## 2014-07-25 NOTE — Patient Instructions (Signed)
[facility] Discharge Instructions  RECOMMENDATIONS MADE BY THE CONSULTANT AND ANY TEST RESULTS WILL BE SENT TO YOUR REFERRING PHYSICIAN.  EXAM FINDINGS BY THE PHYSICIAN TODAY AND SIGNS OR SYMPTOMS TO REPORT TO CLINIC OR PRIMARY PHYSICIAN: Exam and findings as discussed by Robynn Pane, PA-C.  Will increase your lasix to 80 mg for 4 days. Weigh yourself daily and record.  Contact Dr. Cindie Laroche for further instructions regarding your lasix dosage after the 4 days.  Lupron injection today. Report any pain, increased shortness of breath or other concerns.  MEDICATIONS PRESCRIBED:  Lasix 80 mg daily for 4 days then contact Dr. Cindie Laroche for further instructions regarding dosage. Continue zytiga and prednisone as ordered.  INSTRUCTIONS/FOLLOW-UP: Follow-up in 3 months labs, injection, and office visit.  Thank you for choosing [facility] to provide your oncology and hematology care.  To afford each patient quality time with our providers, please arrive at least 15 minutes before your scheduled appointment time.  With your help, our goal is to use those 15 minutes to complete the necessary work-up to ensure our physicians have the information they need to help with your evaluation and healthcare recommendations.    Effective January 1st, 2014, we ask that you re-schedule your appointment with our physicians should you arrive 10 or more minutes late for your appointment.  We strive to give you quality time with our providers, and arriving late affects you and other patients whose appointments are after yours.    Again, thank you for choosing [facility].  Our hope is that these requests will decrease the amount of time that you wait before being seen by our physicians.       _____________________________________________________________  Should you have questions after your visit to [facility], please contact our office at [phone]  between the hours of 8:30 a.m. and 4:30 p.m.  Voicemails left after 4:30 p.m. will not be returned until the following business day.  For prescription refill requests, have your pharmacy contact our office with your prescription refill request.    _______________________________________________________________  We hope that we have given you very good care.  You may receive a patient satisfaction survey in the mail, please complete it and return it as soon as possible.  We value your feedback!  _______________________________________________________________  Have you asked about our STAR program?  STAR stands for Survivorship Training and Rehabilitation, and this is a nationally recognized cancer care program that focuses on survivorship and rehabilitation.  Cancer and cancer treatments may cause problems, such as, pain, making you feel tired and keeping you from doing the things that you need or want to do. Cancer rehabilitation can help. Our goal is to reduce these troubling effects and help you have the best quality of life possible.  You may receive a survey from a nurse that asks questions about your current state of health.  Based on the survey results, all eligible patients will be referred to the [facility] program for an evaluation so we can better serve you!  A frequently asked questions sheet is available upon request.

## 2014-07-26 LAB — TESTOSTERONE: Testosterone: <10 ng/dL — ABNORMAL LOW (ref 300–890)

## 2014-07-26 LAB — PSA: PSA: 1.65 ng/mL (ref <=4.00)

## 2014-09-13 ENCOUNTER — Encounter (HOSPITAL_COMMUNITY): Payer: Self-pay | Admitting: Emergency Medicine

## 2014-09-13 ENCOUNTER — Emergency Department (HOSPITAL_COMMUNITY)
Admission: EM | Admit: 2014-09-13 | Discharge: 2014-09-13 | Disposition: A | Discharge status: Home / Self Care | Payer: Medicare Other | Attending: Emergency Medicine | Admitting: Emergency Medicine

## 2014-09-13 ENCOUNTER — Emergency Department (HOSPITAL_COMMUNITY): Payer: Medicare Other

## 2014-09-13 DIAGNOSIS — K429 Umbilical hernia without obstruction or gangrene: Secondary | ICD-10-CM | POA: Diagnosis not present

## 2014-09-13 DIAGNOSIS — E780 Pure hypercholesterolemia: Secondary | ICD-10-CM | POA: Diagnosis not present

## 2014-09-13 DIAGNOSIS — Z872 Personal history of diseases of the skin and subcutaneous tissue: Secondary | ICD-10-CM | POA: Diagnosis not present

## 2014-09-13 DIAGNOSIS — M1990 Unspecified osteoarthritis, unspecified site: Secondary | ICD-10-CM | POA: Diagnosis not present

## 2014-09-13 DIAGNOSIS — K310 Acute dilatation of stomach: Secondary | ICD-10-CM | POA: Diagnosis not present

## 2014-09-13 DIAGNOSIS — I10 Essential (primary) hypertension: Secondary | ICD-10-CM | POA: Insufficient documentation

## 2014-09-13 DIAGNOSIS — I509 Heart failure, unspecified: Secondary | ICD-10-CM | POA: Diagnosis not present

## 2014-09-13 DIAGNOSIS — R140 Abdominal distension (gaseous): Secondary | ICD-10-CM

## 2014-09-13 DIAGNOSIS — F419 Anxiety disorder, unspecified: Secondary | ICD-10-CM | POA: Insufficient documentation

## 2014-09-13 DIAGNOSIS — Z79899 Other long term (current) drug therapy: Secondary | ICD-10-CM | POA: Insufficient documentation

## 2014-09-13 DIAGNOSIS — N390 Urinary tract infection, site not specified: Secondary | ICD-10-CM | POA: Diagnosis not present

## 2014-09-13 DIAGNOSIS — Z9119 Patient's noncompliance with other medical treatment and regimen: Secondary | ICD-10-CM | POA: Diagnosis not present

## 2014-09-13 DIAGNOSIS — Z7982 Long term (current) use of aspirin: Secondary | ICD-10-CM | POA: Diagnosis not present

## 2014-09-13 DIAGNOSIS — Z792 Long term (current) use of antibiotics: Secondary | ICD-10-CM | POA: Insufficient documentation

## 2014-09-13 DIAGNOSIS — J449 Chronic obstructive pulmonary disease, unspecified: Secondary | ICD-10-CM | POA: Insufficient documentation

## 2014-09-13 DIAGNOSIS — E119 Type 2 diabetes mellitus without complications: Secondary | ICD-10-CM | POA: Diagnosis not present

## 2014-09-13 DIAGNOSIS — Z9889 Other specified postprocedural states: Secondary | ICD-10-CM | POA: Diagnosis not present

## 2014-09-13 DIAGNOSIS — Z923 Personal history of irradiation: Secondary | ICD-10-CM | POA: Diagnosis not present

## 2014-09-13 DIAGNOSIS — Z8546 Personal history of malignant neoplasm of prostate: Secondary | ICD-10-CM | POA: Insufficient documentation

## 2014-09-13 DIAGNOSIS — Z794 Long term (current) use of insulin: Secondary | ICD-10-CM | POA: Diagnosis not present

## 2014-09-13 LAB — CBC WITH DIFFERENTIAL/PLATELET
BASOS ABS: 0 10*3/uL (ref 0.0–0.1)
BASOS PCT: 1 % (ref 0–1)
EOS ABS: 0.2 10*3/uL (ref 0.0–0.7)
Eosinophils Relative: 2 % (ref 0–5)
HEMATOCRIT: 34.1 % — AB (ref 39.0–52.0)
Hemoglobin: 10.7 g/dL — ABNORMAL LOW (ref 13.0–17.0)
Lymphocytes Relative: 16 % (ref 12–46)
Lymphs Abs: 1.2 10*3/uL (ref 0.7–4.0)
MCH: 25.5 pg — AB (ref 26.0–34.0)
MCHC: 31.4 g/dL (ref 30.0–36.0)
MCV: 81.2 fL (ref 78.0–100.0)
MONO ABS: 0.8 10*3/uL (ref 0.1–1.0)
MONOS PCT: 11 % (ref 3–12)
NEUTROS ABS: 5.1 10*3/uL (ref 1.7–7.7)
Neutrophils Relative %: 70 % (ref 43–77)
Platelets: 369 10*3/uL (ref 150–400)
RBC: 4.2 MIL/uL — ABNORMAL LOW (ref 4.22–5.81)
RDW: 18.3 % — AB (ref 11.5–15.5)
WBC: 7.2 10*3/uL (ref 4.0–10.5)

## 2014-09-13 LAB — BASIC METABOLIC PANEL
Anion gap: 12 (ref 5–15)
BUN: 13 mg/dL (ref 6–23)
CHLORIDE: 104 meq/L (ref 96–112)
CO2: 28 mEq/L (ref 19–32)
CREATININE: 0.77 mg/dL (ref 0.50–1.35)
Calcium: 9.1 mg/dL (ref 8.4–10.5)
GFR calc non Af Amer: 89 mL/min — ABNORMAL LOW (ref >90)
Glucose, Bld: 114 mg/dL — ABNORMAL HIGH (ref 70–99)
Potassium: 3.7 mEq/L (ref 3.7–5.3)
Sodium: 144 mEq/L (ref 137–147)

## 2014-09-13 LAB — URINALYSIS, ROUTINE W REFLEX MICROSCOPIC
BILIRUBIN URINE: NEGATIVE
Glucose, UA: NEGATIVE mg/dL
Ketones, ur: NEGATIVE mg/dL
NITRITE: POSITIVE — AB
PROTEIN: NEGATIVE mg/dL
Specific Gravity, Urine: 1.01 (ref 1.005–1.030)
Urobilinogen, UA: 0.2 mg/dL (ref 0.0–1.0)
pH: 6 (ref 5.0–8.0)

## 2014-09-13 LAB — URINE MICROSCOPIC-ADD ON

## 2014-09-13 MED ORDER — SULFAMETHOXAZOLE-TRIMETHOPRIM 800-160 MG PO TABS: 1.0000 | ORAL_TABLET | Freq: Two times a day (BID) | ORAL | Status: DC

## 2014-09-13 NOTE — ED Provider Notes (Signed)
CSN: [id number]     Arrival date & time 09/13/14  1001 History   First MD Initiated Contact with Patient 09/13/14 1302    This chart was scribed for No att. providers found by Terressa Koyanagi, ED Scribe. This patient was seen in room APA05/AP[facility]05 and the patient's care was started at 1:19 PM.  Chief Complaint  Patient presents with  . Bloated  . Back Pain   The history is provided by the patient. No language interpreter was used.   PCP: [name], MD HPI Comments: Oscar Hickman is a 73 y.o. male, with medical Hx noted below, who presents to the Emergency Department complaining of atraumatic, acute, intermittent bilateral flank pain radiating to the abd. Pt describes his abd pain as an aching pain and rates it an 8 out of 10. Pt reports taking ibuprofen at home without relief. Pt denies n/v/d, constipation, bloating, fever, chills, urinary Sx, recent change in medication, Hx of liver problems.   Past Medical History  Diagnosis Date  . Skin infection     history  . HTN (hypertension) 11/23/2011  . High cholesterol   . Diabetes mellitus   . Chronic headache     daily  . Anxiety   . Osteoarthritis   . COPD (chronic obstructive pulmonary disease)     patient denies  . Peripheral neuropathy   . 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 CHF (congestive heart failure)   . Medical non-compliance   . Heart disease   . Cognitive dysfunction    Past Surgical History  Procedure Laterality Date  . Transurethral resection of prostate  03/2008  . Cardiac catheterization  02/2009    minimal non-obstructive CAD  . Cystoscopy w/ ureteral stent placement Right 04/06/2013    Procedure: CYSTOSCOPY WITH RETROGRADE PYELOGRAM/URETERAL STENT PLACEMENT;  Surgeon: Marissa Nestle, MD;  Location: [facility];  Service: Urology;  Laterality: Right;  . Cystoscopy w/ ureteral stent removal Right 04/06/2013    Procedure: CYSTOSCOPY WITH STENT REMOVAL;  Surgeon: Marissa Nestle, MD;  Location: [facility];  Service: Urology;  Laterality: Right;   Family History  Problem Relation Age of Onset  . Cancer Mother   . Cancer Father    History  Substance Use Topics  . Smoking status: Never Smoker   . Smokeless tobacco: Never Used  . Alcohol Use: No    Review of Systems  Constitutional: Negative for chills.  Respiratory: Positive for cough (productive cough with white sputum). Negative for shortness of breath.   Gastrointestinal: Negative for nausea, vomiting, diarrhea and constipation.  Genitourinary: Negative for frequency.  Psychiatric/Behavioral: Negative for confusion.  All other systems reviewed and are negative.     Allergies  Review of patient's allergies indicates no known allergies.  Home Medications   Prior to Admission medications   Medication Sig Start Date End Date Taking? Authorizing Provider  abiraterone Acetate (ZYTIGA) 250 MG tablet Take 4 tablets (1,000 mg total) by mouth daily. Take on an empty stomach 1 hour before or 2 hours after a meal 06/28/14  Yes Baird Cancer, PA-C  acetaminophen (TYLENOL) 650 MG CR tablet Take 650 mg by mouth every 8 (eight) hours as needed for pain. Takes 2 for headache occasionally   Yes Historical Provider, MD  albuterol (PROVENTIL) (2.5 MG/3ML) 0.083% nebulizer solution Take 3 mLs (2.5 mg total) by nebulization every 2 (two) hours as needed for wheezing or shortness of breath. 11/29/13  Yes Ralene Bathe  Dondiego, MD  amLODipine (NORVASC) 5 MG tablet Take 1 tablet (5 mg total) by mouth daily. 05/23/14  Yes [name], MD  aspirin EC 81 MG tablet Take 81 mg by mouth daily.   Yes Historical Provider, MD  carvedilol (COREG) 25 MG tablet Take 25 mg by mouth 2 (two) times daily with a meal.   Yes Historical Provider, MD  cloNIDine (CATAPRES) 0.2 MG tablet Take 1 tablet (0.2 mg total) by mouth 2 (two) times daily. 11/17/13  Yes Richard Marcia Brash, MD  ENSURE PLUS (ENSURE PLUS) LIQD Take 237 mLs by mouth daily.    Yes Historical Provider, MD  furosemide (LASIX) 20 MG tablet Take 20 mg by mouth daily.   Yes Historical Provider, MD  ibuprofen (ADVIL,MOTRIN) 200 MG tablet Take 400 mg by mouth every 6 (six) hours as needed for moderate pain.   Yes Historical Provider, MD  insulin glargine (LANTUS) 100 UNIT/ML injection Inject 25-26 Units into the skin daily.    Yes Historical Provider, MD  isosorbide dinitrate (ISORDIL) 20 MG tablet Take 20 mg by mouth 3 (three) times daily.  06/18/11  Yes Historical Provider, MD  metFORMIN (GLUCOPHAGE) 500 MG tablet Take 500 mg by mouth 2 (two) times daily with a meal.    Yes Historical Provider, MD  potassium chloride SA (K-DUR,KLOR-CON) 20 MEQ tablet Take 20 mEq by mouth daily.   Yes Historical Provider, MD  predniSONE (DELTASONE) 5 MG tablet Take 1 tablet (5 mg total) by mouth 2 (two) times daily with a meal. 05/23/14  Yes [name], MD  tamsulosin (FLOMAX) 0.4 MG CAPS capsule Take 1 capsule (0.4 mg total) by mouth daily. 11/17/13  Yes [name], MD  furosemide (LASIX) 20 MG tablet Take 1 tablet (20 mg total) by mouth 2 (two) times daily. Patient not taking: Reported on 09/13/2014 07/02/14 09/13/14  Jasper Riling. Stephane Niemann, MD  sulfamethoxazole-trimethoprim (BACTRIM DS,SEPTRA DS) 800-160 MG per tablet Take 1 tablet by mouth 2 (two) times daily. 09/13/14   Jasper Riling. Alvino Chapel, MD   Triage Vitals: BP 165/108 mmHg  Pulse 85  Temp(Src) 97.9 F (36.6 C) (Oral)  Resp 25  Ht 5\' 7"  (1.702 m)  Wt 185 lb (83.915 kg)  BMI 28.97 kg/m2  SpO2 95% Physical Exam  Constitutional: He is oriented to person, place, and time. He appears well-developed and well-nourished. No distress.  HENT:  Head: Normocephalic and atraumatic.  Eyes: Conjunctivae and EOM are normal.  Neck: Neck supple. No JVD present. No tracheal deviation present.  Cardiovascular: Normal rate and regular rhythm.   Pulmonary/Chest: Effort normal. No respiratory distress. He has no wheezes. He has no rales.   Abdominal: Soft. Bowel sounds are normal. He exhibits no distension. There is no tenderness. There is no rebound.  Reducible umbilical hernia  Musculoskeletal: Normal range of motion.  Neurological: He is alert and oriented to person, place, and time.  Skin: Skin is warm and dry.  Psychiatric: He has a normal mood and affect. His behavior is normal.  Nursing note and vitals reviewed.   ED Course  Procedures (including critical care time) DIAGNOSTIC STUDIES: Oxygen Saturation is 95% on RA, adequate by my interpretation.    COORDINATION OF CARE: 1:23 PM-Discussed treatment plan which includes labs and imaging  with pt at bedside and pt agreed to plan.  Labs Review Labs Reviewed  URINALYSIS, ROUTINE W REFLEX MICROSCOPIC - Abnormal; Notable for the following:    Hgb urine dipstick SMALL (*)    Nitrite  POSITIVE (*)    Leukocytes, UA LARGE (*)    All other components within normal limits  CBC WITH DIFFERENTIAL - Abnormal; Notable for the following:    RBC 4.20 (*)    Hemoglobin 10.7 (*)    HCT 34.1 (*)    MCH 25.5 (*)    RDW 18.3 (*)    All other components within normal limits  BASIC METABOLIC PANEL - Abnormal; Notable for the following:    Glucose, Bld 114 (*)    GFR calc non Af Amer 89 (*)    All other components within normal limits  URINE MICROSCOPIC-ADD ON - Abnormal; Notable for the following:    Bacteria, UA MANY (*)    All other components within normal limits  URINE CULTURE    Imaging Review Dg Abd Acute W/chest  09/13/2014   CLINICAL DATA:  Abdominal distention. Patient also complaining of bilateral flank pain radiating into the abdomen. History of prostate carcinoma. Pain and distension present for an unspecified length of time.  EXAM: ACUTE ABDOMEN SERIES (ABDOMEN 2 VIEW & CHEST 1 VIEW)  COMPARISON:  07/02/2014.  FINDINGS: Cardiac silhouette remains mildly enlarged and the aorta uncoiled. There are prominent hilar vessels without change. No pulmonary edema or lung  consolidation. No pleural effusion or pneumothorax.  Mild generalized increased bowel gas, but no significant bowel distention. There is no evidence of obstruction, generalized adynamic ileus or free air.  Right ureteral stent appears grossly well-positioned.  There are aortoiliac vascular calcifications. Soft tissues otherwise unremarkable.  Bony structures are demineralized.  IMPRESSION: 1. No acute findings. No obstruction or generalized adynamic ileus. No free air. 2. Persistent prominent hilar vessels without change from the prior chest radiograph. No pulmonary edema or pneumonia.   Electronically Signed   By: Lajean Manes M.D.   On: 09/13/2014 14:28     EKG Interpretation None      MDM   Final diagnoses:  Abdominal distention  UTI (lower urinary tract infection)    Patient with some abdominal complaints. Back pain. Found to have UTI. Lab work and imaging otherwise reassuring. Will discharge home with antibiotics.  I personally performed the services described in this documentation, which was scribed in my presence. The recorded information has been reviewed and is accurate.     Jasper Riling. Alvino Chapel, MD 09/13/14 2146

## 2014-09-13 NOTE — ED Notes (Signed)
Patient with c/o bilateral flank pain that radiates into the abdomen. Patient has distended abdomen.

## 2014-09-13 NOTE — Discharge Instructions (Signed)

## 2014-09-17 ENCOUNTER — Encounter (HOSPITAL_COMMUNITY): Payer: Self-pay | Admitting: Emergency Medicine

## 2014-09-17 ENCOUNTER — Inpatient Hospital Stay (HOSPITAL_COMMUNITY)
Admission: EM | Admit: 2014-09-17 | Discharge: 2014-09-25 | DRG: 689 | Disposition: A | Discharge status: Home Health | Payer: Medicare Other | Attending: Internal Medicine | Admitting: Internal Medicine

## 2014-09-17 ENCOUNTER — Emergency Department (HOSPITAL_COMMUNITY): Payer: Medicare Other

## 2014-09-17 DIAGNOSIS — Z79899 Other long term (current) drug therapy: Secondary | ICD-10-CM

## 2014-09-17 DIAGNOSIS — N12 Tubulo-interstitial nephritis, not specified as acute or chronic: Secondary | ICD-10-CM | POA: Diagnosis present

## 2014-09-17 DIAGNOSIS — B961 Klebsiella pneumoniae [K. pneumoniae] as the cause of diseases classified elsewhere: Secondary | ICD-10-CM | POA: Diagnosis not present

## 2014-09-17 DIAGNOSIS — F419 Anxiety disorder, unspecified: Secondary | ICD-10-CM | POA: Diagnosis not present

## 2014-09-17 DIAGNOSIS — I5023 Acute on chronic systolic (congestive) heart failure: Secondary | ICD-10-CM | POA: Diagnosis not present

## 2014-09-17 DIAGNOSIS — N10 Acute tubulo-interstitial nephritis: Principal | ICD-10-CM | POA: Diagnosis present

## 2014-09-17 DIAGNOSIS — R0602 Shortness of breath: Secondary | ICD-10-CM

## 2014-09-17 DIAGNOSIS — Z7982 Long term (current) use of aspirin: Secondary | ICD-10-CM

## 2014-09-17 DIAGNOSIS — R05 Cough: Secondary | ICD-10-CM

## 2014-09-17 DIAGNOSIS — C61 Malignant neoplasm of prostate: Secondary | ICD-10-CM | POA: Diagnosis not present

## 2014-09-17 DIAGNOSIS — Z9119 Patient's noncompliance with other medical treatment and regimen: Secondary | ICD-10-CM | POA: Diagnosis present

## 2014-09-17 DIAGNOSIS — Z9079 Acquired absence of other genital organ(s): Secondary | ICD-10-CM | POA: Diagnosis not present

## 2014-09-17 DIAGNOSIS — Z923 Personal history of irradiation: Secondary | ICD-10-CM

## 2014-09-17 DIAGNOSIS — M549 Dorsalgia, unspecified: Secondary | ICD-10-CM | POA: Diagnosis not present

## 2014-09-17 DIAGNOSIS — N1330 Unspecified hydronephrosis: Secondary | ICD-10-CM

## 2014-09-17 DIAGNOSIS — C799 Secondary malignant neoplasm of unspecified site: Secondary | ICD-10-CM | POA: Diagnosis present

## 2014-09-17 DIAGNOSIS — R109 Unspecified abdominal pain: Secondary | ICD-10-CM

## 2014-09-17 DIAGNOSIS — R059 Cough, unspecified: Secondary | ICD-10-CM

## 2014-09-17 DIAGNOSIS — I10 Essential (primary) hypertension: Secondary | ICD-10-CM | POA: Diagnosis present

## 2014-09-17 DIAGNOSIS — E118 Type 2 diabetes mellitus with unspecified complications: Secondary | ICD-10-CM

## 2014-09-17 DIAGNOSIS — I5022 Chronic systolic (congestive) heart failure: Secondary | ICD-10-CM

## 2014-09-17 DIAGNOSIS — M4854XA Collapsed vertebra, not elsewhere classified, thoracic region, initial encounter for fracture: Secondary | ICD-10-CM | POA: Diagnosis present

## 2014-09-17 DIAGNOSIS — R1030 Lower abdominal pain, unspecified: Secondary | ICD-10-CM

## 2014-09-17 DIAGNOSIS — E11649 Type 2 diabetes mellitus with hypoglycemia without coma: Secondary | ICD-10-CM | POA: Diagnosis present

## 2014-09-17 DIAGNOSIS — Z960 Presence of urogenital implants: Secondary | ICD-10-CM

## 2014-09-17 DIAGNOSIS — N390 Urinary tract infection, site not specified: Secondary | ICD-10-CM

## 2014-09-17 LAB — URINALYSIS, ROUTINE W REFLEX MICROSCOPIC
GLUCOSE, UA: 100 mg/dL — AB
Nitrite: POSITIVE — AB
PH: 6.5 (ref 5.0–8.0)
PROTEIN: 100 mg/dL — AB
Specific Gravity, Urine: >1.03 — ABNORMAL HIGH (ref 1.005–1.030)
Urobilinogen, UA: 2 mg/dL — ABNORMAL HIGH (ref 0.0–1.0)

## 2014-09-17 LAB — COMPREHENSIVE METABOLIC PANEL
ALK PHOS: 99 U/L (ref 39–117)
ALT: 11 U/L (ref 0–53)
AST: 13 U/L (ref 0–37)
Albumin: 3 g/dL — ABNORMAL LOW (ref 3.5–5.2)
Anion gap: 12 (ref 5–15)
BILIRUBIN TOTAL: 0.6 mg/dL (ref 0.3–1.2)
BUN: 17 mg/dL (ref 6–23)
CHLORIDE: 102 meq/L (ref 96–112)
CO2: 28 mEq/L (ref 19–32)
Calcium: 9.2 mg/dL (ref 8.4–10.5)
Creatinine, Ser: 0.96 mg/dL (ref 0.50–1.35)
GFR calc non Af Amer: 81 mL/min — ABNORMAL LOW (ref >90)
GLUCOSE: 234 mg/dL — AB (ref 70–99)
Potassium: 4 mEq/L (ref 3.7–5.3)
SODIUM: 142 meq/L (ref 137–147)
Total Protein: 6.7 g/dL (ref 6.0–8.3)

## 2014-09-17 LAB — URINE MICROSCOPIC-ADD ON

## 2014-09-17 LAB — CBC
HCT: 34.6 % — ABNORMAL LOW (ref 39.0–52.0)
HEMOGLOBIN: 10.7 g/dL — AB (ref 13.0–17.0)
MCH: 25.2 pg — ABNORMAL LOW (ref 26.0–34.0)
MCHC: 30.9 g/dL (ref 30.0–36.0)
MCV: 81.4 fL (ref 78.0–100.0)
Platelets: 349 10*3/uL (ref 150–400)
RBC: 4.25 MIL/uL (ref 4.22–5.81)
RDW: 18.3 % — ABNORMAL HIGH (ref 11.5–15.5)
WBC: 6.3 10*3/uL (ref 4.0–10.5)

## 2014-09-17 LAB — CBC WITH DIFFERENTIAL/PLATELET
BASOS ABS: 0 10*3/uL (ref 0.0–0.1)
Basophils Relative: 1 % (ref 0–1)
Eosinophils Absolute: 0.2 10*3/uL (ref 0.0–0.7)
Eosinophils Relative: 2 % (ref 0–5)
HEMATOCRIT: 33.9 % — AB (ref 39.0–52.0)
Hemoglobin: 10.7 g/dL — ABNORMAL LOW (ref 13.0–17.0)
Lymphocytes Relative: 23 % (ref 12–46)
Lymphs Abs: 1.5 10*3/uL (ref 0.7–4.0)
MCH: 25.7 pg — ABNORMAL LOW (ref 26.0–34.0)
MCHC: 31.6 g/dL (ref 30.0–36.0)
MCV: 81.5 fL (ref 78.0–100.0)
MONO ABS: 0.9 10*3/uL (ref 0.1–1.0)
Monocytes Relative: 13 % — ABNORMAL HIGH (ref 3–12)
NEUTROS ABS: 4 10*3/uL (ref 1.7–7.7)
Neutrophils Relative %: 61 % (ref 43–77)
PLATELETS: 363 10*3/uL (ref 150–400)
RBC: 4.16 MIL/uL — ABNORMAL LOW (ref 4.22–5.81)
RDW: 18.4 % — AB (ref 11.5–15.5)
WBC: 6.6 10*3/uL (ref 4.0–10.5)

## 2014-09-17 LAB — HEMOGLOBIN A1C
HEMOGLOBIN A1C: 7.6 % — AB (ref <5.7)
Mean Plasma Glucose: 171 mg/dL — ABNORMAL HIGH (ref <117)

## 2014-09-17 LAB — URINE CULTURE

## 2014-09-17 LAB — CREATININE, SERUM
Creatinine, Ser: 0.85 mg/dL (ref 0.50–1.35)
GFR calc Af Amer: >90 mL/min (ref >90)
GFR calc non Af Amer: 85 mL/min — ABNORMAL LOW (ref >90)

## 2014-09-17 LAB — TROPONIN I: Troponin I: <0.3 ng/mL (ref <0.30)

## 2014-09-17 MED ORDER — IOHEXOL 300 MG/ML  SOLN
50.0000 mL | Freq: Once | INTRAMUSCULAR | Status: AC | PRN
  Administered 2014-09-17: 50 mL via ORAL

## 2014-09-17 MED ORDER — ASPIRIN EC 81 MG PO TBEC
81.0000 mg | DELAYED_RELEASE_TABLET | Freq: Every day | ORAL | Status: DC
  Administered 2014-09-18 – 2014-09-25 (×8): 81 mg via ORAL
  Filled 2014-09-17 (×8): qty 1

## 2014-09-17 MED ORDER — AMLODIPINE BESYLATE 5 MG PO TABS
5.0000 mg | ORAL_TABLET | Freq: Every day | ORAL | Status: DC
  Administered 2014-09-17 – 2014-09-25 (×9): 5 mg via ORAL
  Filled 2014-09-17 (×9): qty 1

## 2014-09-17 MED ORDER — INSULIN DETEMIR 100 UNIT/ML ~~LOC~~ SOLN
20.0000 [IU] | Freq: Every day | SUBCUTANEOUS | Status: DC
  Administered 2014-09-17: 20 [IU] via SUBCUTANEOUS
  Filled 2014-09-17 (×2): qty 0.2

## 2014-09-17 MED ORDER — ONDANSETRON HCL 4 MG/2ML IJ SOLN: 4.0000 mg | Freq: Four times a day (QID) | INTRAMUSCULAR | Status: DC | PRN

## 2014-09-17 MED ORDER — SENNOSIDES-DOCUSATE SODIUM 8.6-50 MG PO TABS: 1.0000 | ORAL_TABLET | Freq: Every evening | ORAL | Status: DC | PRN

## 2014-09-17 MED ORDER — OXYCODONE HCL 5 MG PO TABS
5.0000 mg | ORAL_TABLET | ORAL | Status: DC | PRN
  Administered 2014-09-17 – 2014-09-22 (×19): 5 mg via ORAL
  Filled 2014-09-17 (×19): qty 1

## 2014-09-17 MED ORDER — ISOSORBIDE DINITRATE 20 MG PO TABS
20.0000 mg | ORAL_TABLET | Freq: Three times a day (TID) | ORAL | Status: DC
  Administered 2014-09-17 – 2014-09-25 (×23): 20 mg via ORAL
  Filled 2014-09-17 (×25): qty 1

## 2014-09-17 MED ORDER — ONDANSETRON HCL 4 MG PO TABS: 4.0000 mg | ORAL_TABLET | Freq: Four times a day (QID) | ORAL | Status: DC | PRN

## 2014-09-17 MED ORDER — INSULIN ASPART 100 UNIT/ML ~~LOC~~ SOLN
4.0000 [IU] | Freq: Three times a day (TID) | SUBCUTANEOUS | Status: DC
  Administered 2014-09-19 – 2014-09-25 (×17): 4 [IU] via SUBCUTANEOUS

## 2014-09-17 MED ORDER — PREDNISONE 5 MG PO TABS
5.0000 mg | ORAL_TABLET | Freq: Two times a day (BID) | ORAL | Status: DC
  Administered 2014-09-18 – 2014-09-25 (×15): 5 mg via ORAL
  Filled 2014-09-17 (×18): qty 1

## 2014-09-17 MED ORDER — PIPERACILLIN-TAZOBACTAM 3.375 G IVPB
3.3750 g | Freq: Three times a day (TID) | INTRAVENOUS | Status: DC
  Administered 2014-09-17 – 2014-09-19 (×5): 3.375 g via INTRAVENOUS
  Filled 2014-09-17 (×6): qty 50

## 2014-09-17 MED ORDER — ACETAMINOPHEN 650 MG RE SUPP: 650.0000 mg | Freq: Four times a day (QID) | RECTAL | Status: DC | PRN

## 2014-09-17 MED ORDER — CLONIDINE HCL 0.2 MG PO TABS
0.2000 mg | ORAL_TABLET | Freq: Two times a day (BID) | ORAL | Status: DC
  Administered 2014-09-17 – 2014-09-25 (×16): 0.2 mg via ORAL
  Filled 2014-09-17 (×20): qty 1

## 2014-09-17 MED ORDER — HYDRALAZINE HCL 20 MG/ML IJ SOLN: 5.0000 mg | Freq: Four times a day (QID) | INTRAMUSCULAR | Status: DC | PRN

## 2014-09-17 MED ORDER — ALBUTEROL SULFATE (2.5 MG/3ML) 0.083% IN NEBU: 2.5000 mg | INHALATION_SOLUTION | RESPIRATORY_TRACT | Status: DC | PRN

## 2014-09-17 MED ORDER — IOHEXOL 300 MG/ML  SOLN
100.0000 mL | Freq: Once | INTRAMUSCULAR | Status: AC | PRN
  Administered 2014-09-17: 100 mL via INTRAVENOUS

## 2014-09-17 MED ORDER — ACETAMINOPHEN 325 MG PO TABS
650.0000 mg | ORAL_TABLET | Freq: Four times a day (QID) | ORAL | Status: DC | PRN
  Administered 2014-09-18 – 2014-09-20 (×3): 650 mg via ORAL
  Filled 2014-09-17 (×3): qty 2

## 2014-09-17 MED ORDER — PIPERACILLIN-TAZOBACTAM 3.375 G IVPB 30 MIN
3.3750 g | Freq: Once | INTRAVENOUS | Status: AC
  Administered 2014-09-17: 3.375 g via INTRAVENOUS
  Filled 2014-09-17: qty 50

## 2014-09-17 MED ORDER — TAMSULOSIN HCL 0.4 MG PO CAPS
0.4000 mg | ORAL_CAPSULE | Freq: Every day | ORAL | Status: DC
  Administered 2014-09-17 – 2014-09-25 (×9): 0.4 mg via ORAL
  Filled 2014-09-17 (×9): qty 1

## 2014-09-17 MED ORDER — MORPHINE SULFATE 4 MG/ML IJ SOLN
6.0000 mg | Freq: Once | INTRAMUSCULAR | Status: AC
  Administered 2014-09-17: 6 mg via INTRAVENOUS
  Filled 2014-09-17: qty 2

## 2014-09-17 MED ORDER — ONDANSETRON HCL 4 MG/2ML IJ SOLN
4.0000 mg | Freq: Once | INTRAMUSCULAR | Status: AC
  Administered 2014-09-17: 4 mg via INTRAVENOUS
  Filled 2014-09-17: qty 2

## 2014-09-17 MED ORDER — CARVEDILOL 25 MG PO TABS
25.0000 mg | ORAL_TABLET | Freq: Two times a day (BID) | ORAL | Status: DC
  Administered 2014-09-17 – 2014-09-25 (×16): 25 mg via ORAL
  Filled 2014-09-17 (×19): qty 1

## 2014-09-17 MED ORDER — HEPARIN SODIUM (PORCINE) 5000 UNIT/ML IJ SOLN
5000.0000 [IU] | Freq: Three times a day (TID) | INTRAMUSCULAR | Status: DC
  Administered 2014-09-17 – 2014-09-25 (×23): 5000 [IU] via SUBCUTANEOUS
  Filled 2014-09-17 (×26): qty 1

## 2014-09-17 MED ORDER — INSULIN ASPART 100 UNIT/ML ~~LOC~~ SOLN
0.0000 [IU] | Freq: Three times a day (TID) | SUBCUTANEOUS | Status: DC
  Administered 2014-09-17: 5 [IU] via SUBCUTANEOUS
  Administered 2014-09-19 (×2): 2 [IU] via SUBCUTANEOUS
  Administered 2014-09-19: 5 [IU] via SUBCUTANEOUS
  Administered 2014-09-20: 3 [IU] via SUBCUTANEOUS
  Administered 2014-09-20: 2 [IU] via SUBCUTANEOUS
  Administered 2014-09-21: 3 [IU] via SUBCUTANEOUS
  Administered 2014-09-21 (×2): 2 [IU] via SUBCUTANEOUS
  Administered 2014-09-22: 3 [IU] via SUBCUTANEOUS
  Administered 2014-09-22 – 2014-09-23 (×2): 2 [IU] via SUBCUTANEOUS
  Administered 2014-09-23 (×2): 3 [IU] via SUBCUTANEOUS
  Administered 2014-09-24 (×2): 2 [IU] via SUBCUTANEOUS
  Administered 2014-09-25: 3 [IU] via SUBCUTANEOUS
  Administered 2014-09-25: 2 [IU] via SUBCUTANEOUS

## 2014-09-17 NOTE — ED Notes (Signed)
Patient requesting medication for nausea. States "I feel like I am going to be sick again." Verbal order for zofran 4mg  IV.

## 2014-09-17 NOTE — H&P (Addendum)
[facility]          History and Physical    PCP:   [name], MD   Chief Complaint:  Back pain  HPI: Patient is a 73 year old man with past medical history significant for prostate cancer, chronic systolic CHF with known ejection fraction of 20-25%, hypertension, diabetes, presents to the hospital today with over a week long history of severe, constant right sided mid to lower back pain. He was seen in the emergency department at [facility] 4 days ago and diagnosed with a UTI and was discharged on Bactrim. That urine culture is growing greater than 100,000 gram-negative rods with sensitivities still pending. In the ED he is again found to have a dirty urine, CT scan with moderate right hydronephrosis and perinephric stranding. He has had a right ureteral stent in place for what seems like over a year. EDP has consulted with urologist, Dr. Tresa Moore, who recommends transfer to [facility] long hospital for possible percutaneous nephrostomy tube and stent removal in addition to continued treatment of his pyelonephritis with IV antibiotics.  Allergies:  No Known Allergies    Past Medical History  Diagnosis Date  . Skin infection     history  . HTN (hypertension) 11/23/2011  . High cholesterol   . Diabetes mellitus   . Chronic headache     daily  . Anxiety   . Osteoarthritis   . COPD (chronic obstructive pulmonary disease)     patient denies  . Peripheral neuropathy   . 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 CHF (congestive heart failure)   . Medical non-compliance   . Heart disease   . Cognitive dysfunction     Past Surgical History  Procedure Laterality Date  . Transurethral resection of prostate  03/2008  . Cardiac catheterization  02/2009    minimal non-obstructive CAD  . Cystoscopy w/ ureteral stent placement Right 04/06/2013    Procedure: CYSTOSCOPY WITH RETROGRADE PYELOGRAM/URETERAL STENT PLACEMENT;  Surgeon:  Marissa Nestle, MD;  Location: [facility];  Service: Urology;  Laterality: Right;  . Cystoscopy w/ ureteral stent removal Right 04/06/2013    Procedure: CYSTOSCOPY WITH STENT REMOVAL;  Surgeon: Marissa Nestle, MD;  Location: [facility];  Service: Urology;  Laterality: Right;    Prior to Admission medications   Medication Sig Start Date End Date Taking? Authorizing Provider  abiraterone Acetate (ZYTIGA) 250 MG tablet Take 4 tablets (1,000 mg total) by mouth daily. Take on an empty stomach 1 hour before or 2 hours after a meal 06/28/14  Yes Baird Cancer, PA-C  acetaminophen (TYLENOL) 650 MG CR tablet Take 650 mg by mouth every 8 (eight) hours as needed for pain. Takes 2 for headache occasionally   Yes Historical Provider, MD  albuterol (PROVENTIL) (2.5 MG/3ML) 0.083% nebulizer solution Take 3 mLs (2.5 mg total) by nebulization every 2 (two) hours as needed for wheezing or shortness of breath. 11/29/13  Yes [name], MD  amLODipine (NORVASC) 5 MG tablet Take 1 tablet (5 mg total) by mouth daily. 05/23/14  Yes [name], MD  aspirin EC 81 MG tablet Take 81 mg by mouth daily.   Yes Historical Provider, MD  carvedilol (COREG) 25 MG tablet Take 25 mg by mouth 2 (two) times daily with a meal.   Yes Historical Provider, MD  cloNIDine (CATAPRES) 0.2 MG tablet Take 1 tablet (  0.2 mg total) by mouth 2 (two) times daily. 11/17/13  Yes Richard Marcia Brash, MD  ENSURE PLUS (ENSURE PLUS) LIQD Take 237 mLs by mouth daily.   Yes Historical Provider, MD  furosemide (LASIX) 20 MG tablet Take 20 mg by mouth daily.   Yes Historical Provider, MD  ibuprofen (ADVIL,MOTRIN) 200 MG tablet Take 400 mg by mouth every 6 (six) hours as needed for moderate pain.   Yes Historical Provider, MD  insulin glargine (LANTUS) 100 UNIT/ML injection Inject 25-26 Units into the skin daily.    Yes Historical Provider, MD  isosorbide dinitrate (ISORDIL) 20 MG tablet Take 20 mg by mouth 3 (three) times daily.  06/18/11  Yes  Historical Provider, MD  metFORMIN (GLUCOPHAGE) 500 MG tablet Take 500 mg by mouth 2 (two) times daily with a meal.    Yes Historical Provider, MD  potassium chloride SA (K-DUR,KLOR-CON) 20 MEQ tablet Take 20 mEq by mouth daily.   Yes Historical Provider, MD  predniSONE (DELTASONE) 5 MG tablet Take 1 tablet (5 mg total) by mouth 2 (two) times daily with a meal. 05/23/14  Yes [name], MD  sulfamethoxazole-trimethoprim (BACTRIM DS,SEPTRA DS) 800-160 MG per tablet Take 1 tablet by mouth 2 (two) times daily. 09/13/14  Yes Nathan R. Pickering, MD  tamsulosin (FLOMAX) 0.4 MG CAPS capsule Take 1 capsule (0.4 mg total) by mouth daily. 11/17/13  Yes [name], MD  furosemide (LASIX) 20 MG tablet Take 1 tablet (20 mg total) by mouth 2 (two) times daily. Patient not taking: Reported on 09/13/2014 07/02/14 09/13/14  Jasper Riling. Alvino Chapel, MD    Social History:  reports that he has never smoked. He has never used smokeless tobacco. He reports that he does not drink alcohol or use illicit drugs.  Family History  Problem Relation Age of Onset  . Cancer Mother   . Cancer Father     Review of Systems:  Constitutional: Denies fever, chills, diaphoresis, appetite change and fatigue.  HEENT: Denies photophobia, eye pain, redness, hearing loss, ear pain, congestion, sore throat, rhinorrhea, sneezing, mouth sores, trouble swallowing, neck pain, neck stiffness and tinnitus.   Respiratory: Denies SOB, DOE, cough, chest tightness,  and wheezing.   Cardiovascular: Denies chest pain, palpitations and leg swelling.  Gastrointestinal: Denies nausea, vomiting,  diarrhea, constipation, blood in stool and abdominal distention.  Genitourinary: Denies dysuria, urgency, frequency, hematuria. Endocrine: Denies: hot or cold intolerance, sweats, changes in hair or nails, polyuria, polydipsia. Musculoskeletal: Denies myalgias, joint swelling, arthralgias and gait problem.  Skin: Denies pallor, rash and wound.    Neurological: Denies dizziness, seizures, syncope, weakness, light-headedness, numbness and headaches.  Hematological: Denies adenopathy. Easy bruising, personal or family bleeding history  Psychiatric/Behavioral: Denies suicidal ideation, mood changes, confusion, nervousness, sleep disturbance and agitation   Physical Exam: Blood pressure 175/105, pulse 92, temperature 97.7 F (36.5 C), temperature source Oral, resp. rate 25, height $RemoveBe'5\' 9"'vlbzjSgaz$  (1.753 m), weight 83.915 kg (185 lb), SpO2 97 %. General: Alert, awake, oriented 3, back pain currently improved. HEENT: Normocephalic, atraumatic, pupils equal round and reactive to light, extraocular movements intact. Neck: Supple, no JVD, no lymphadenopathy, no bruits, no goiter.  Cardiovascular: Regular rate and rhythm, systolic ejection murmur, no rubs or gallops. Lungs: Clear to auscultation bilaterally Abdomen: Soft, nontender, nondistended, positive bowel sounds Extremities: 2+ pitting edema bilaterally Neurologic: Grossly intact and nonfocal  Labs on Admission:  Results for orders placed or performed during the hospital encounter of 09/17/14 (from the past 48 hour(s))  Urinalysis,  Routine w reflex microscopic     Status: Abnormal   Collection Time: 09/17/14 10:29 AM  Result Value Ref Range   Color, Urine YELLOW YELLOW   APPearance HAZY (A) CLEAR   Specific Gravity, Urine >1.030 (H) 1.005 - 1.030   pH 6.5 5.0 - 8.0   Glucose, UA 100 (A) NEGATIVE mg/dL   Hgb urine dipstick LARGE (A) NEGATIVE   Bilirubin Urine SMALL (A) NEGATIVE   Ketones, ur TRACE (A) NEGATIVE mg/dL   Protein, ur 208 (A) NEGATIVE mg/dL   Urobilinogen, UA 2.0 (H) 0.0 - 1.0 mg/dL   Nitrite POSITIVE (A) NEGATIVE   Leukocytes, UA SMALL (A) NEGATIVE  Urine microscopic-add on     Status: Abnormal   Collection Time: 09/17/14 10:29 AM  Result Value Ref Range   WBC, UA 3-6 <3 WBC/hpf   RBC / HPF TOO NUMEROUS TO COUNT <3 RBC/hpf   Bacteria, UA MANY (A) RARE  CBC with  Differential     Status: Abnormal   Collection Time: 09/17/14 11:31 AM  Result Value Ref Range   WBC 6.6 4.0 - 10.5 K/uL   RBC 4.16 (L) 4.22 - 5.81 MIL/uL   Hemoglobin 10.7 (L) 13.0 - 17.0 g/dL   HCT 86.8 (L) 55.2 - 50.6 %   MCV 81.5 78.0 - 100.0 fL   MCH 25.7 (L) 26.0 - 34.0 pg   MCHC 31.6 30.0 - 36.0 g/dL   RDW 04.9 (H) 33.1 - 99.1 %   Platelets 363 150 - 400 K/uL   Neutrophils Relative % 61 43 - 77 %   Neutro Abs 4.0 1.7 - 7.7 K/uL   Lymphocytes Relative 23 12 - 46 %   Lymphs Abs 1.5 0.7 - 4.0 K/uL   Monocytes Relative 13 (H) 3 - 12 %   Monocytes Absolute 0.9 0.1 - 1.0 K/uL   Eosinophils Relative 2 0 - 5 %   Eosinophils Absolute 0.2 0.0 - 0.7 K/uL   Basophils Relative 1 0 - 1 %   Basophils Absolute 0.0 0.0 - 0.1 K/uL  Comprehensive metabolic panel     Status: Abnormal   Collection Time: 09/17/14 11:31 AM  Result Value Ref Range   Sodium 142 137 - 147 mEq/L   Potassium 4.0 3.7 - 5.3 mEq/L   Chloride 102 96 - 112 mEq/L   CO2 28 19 - 32 mEq/L   Glucose, Bld 234 (H) 70 - 99 mg/dL   BUN 17 6 - 23 mg/dL   Creatinine, Ser 9.06 0.50 - 1.35 mg/dL   Calcium 9.2 8.4 - 07.7 mg/dL   Total Protein 6.7 6.0 - 8.3 g/dL   Albumin 3.0 (L) 3.5 - 5.2 g/dL   AST 13 0 - 37 U/L   ALT 11 0 - 53 U/L   Alkaline Phosphatase 99 39 - 117 U/L   Total Bilirubin 0.6 0.3 - 1.2 mg/dL   GFR calc non Af Amer 81 (L) >90 mL/min   GFR calc Af Amer >90 >90 mL/min    Comment: (NOTE) The eGFR has been calculated using the CKD EPI equation. This calculation has not been validated in all clinical situations. eGFR's persistently <90 mL/min signify possible Chronic Kidney Disease.    Anion gap 12 5 - 15  Troponin I     Status: None   Collection Time: 09/17/14 11:31 AM  Result Value Ref Range   Troponin I <0.30 <0.30 ng/mL    Comment:        Due to the release kinetics  of cTnI, a negative result within the first hours of the onset of symptoms does not rule out myocardial infarction with certainty. If  myocardial infarction is still suspected, repeat the test at appropriate intervals.     Radiological Exams on Admission: Dg Chest 2 View  09/17/2014   CLINICAL DATA:  Cough.  Short of breath and back pain.  Vomiting.  EXAM: CHEST  2 VIEW  COMPARISON:  07/02/2014  FINDINGS: Mild enlargement of the cardiopericardial silhouette. Aorta is uncoiled.  Pulmonary arteries and veins centrally all prominent as they were previously. No lung consolidation or overt edema. No pleural effusion or pneumothorax.  The bony thorax is demineralized but grossly intact.  IMPRESSION: 1. No change from prior study. Significant prominence of the pulmonary vasculature centrally common overt pulmonary edema. No evidence of pneumonia. Stable cardiomegaly.   Electronically Signed   By: Lajean Manes M.D.   On: 09/17/2014 14:08   Ct Abdomen Pelvis W Contrast  09/17/2014   CLINICAL DATA:  Bilateral lower back pain, nausea, vomiting  EXAM: CT ABDOMEN AND PELVIS WITH CONTRAST  TECHNIQUE: Multidetector CT imaging of the abdomen and pelvis was performed using the standard protocol following bolus administration of intravenous contrast.  CONTRAST:  73mL OMNIPAQUE IOHEXOL 300 MG/ML SOLN, 185mL OMNIPAQUE IOHEXOL 300 MG/ML SOLN  COMPARISON:  04/05/2013, 04/20/2012  FINDINGS: There are bilateral trace pleural effusions. There is mild bilateral interstitial thickening. There is cardiomegaly. There is coronary artery atherosclerosis.  There is a 10 mm hypodensity in the posterior right hepatic low which is too small to characterize. There is no intrahepatic or extrahepatic biliary ductal dilatation. The gallbladder is normal. The spleen demonstrates no focal abnormality. There is a right nephro ureteral stent with moderate right hydronephrosis. The proximal portion of the stent is within the proximal right ureter and terminating in the bladder. There is mild right perinephric and proximal periureteral stranding. The left kidney is unremarkable  without obstructive uropathy. The adrenal glands and pancreas are normal. The bladder is unremarkable. Prior TURP.  The stomach, duodenum, small intestine, and large intestine demonstrate no bowel dilatation or wall thickening. There is a Forensic psychologist type umbilical hernia. There is no pneumoperitoneum, pneumatosis, or portal venous gas. There is no abdominal or pelvic free fluid. There is no lymphadenopathy.  The abdominal aorta is normal in caliber with atherosclerosis.  There are no lytic or sclerotic osseous lesions. There is a T11 vertebral body compression fracture of indeterminate age. There are mild broad-based disc bulges at L3-4, L4-5 and L5-S1.  IMPRESSION: 1. Age-indeterminate T11 vertebral body compression fracture. 2. Right ureteral stent is present. Moderate right hydronephrosis and perinephric stranding. Overall appearance is unchanged from the prior exams. 3. Cardiomegaly with bilateral pleural effusions and mild interstitial thickening concerning for mild CHF.   Electronically Signed   By: Kathreen Devoid   On: 09/17/2014 14:17    Assessment/Plan Principal Problem:   Pyelonephritis Active Problems:   ADENOCARCINOMA, PROSTATE   DM type 2 causing complication   HTN (hypertension)   Hydronephrosis of right kidney   Chronic systolic CHF (congestive heart failure)    Pyelonephritis -Admit for IV antibiotics. Will start on Zosyn. -Panculture including blood and urine cultures. -Suspect pyelonephritis may have to do with infected ureteral stent that has been in place for what appears to be about a year and a half. -EDP has consulted with urologist who recommends transfer to [facility] for urologic procedures as a urologist is not currently available at this hospital.  Chronic systolic CHF -Appears clinically compensated at present.  -Last echo in July 2015 with an ejection fraction of 20-25% with moderate to severe LVH.  Hypertension -Currently uncontrolled. -Suspect some of it  may be related to pain and should improve now that pain is better controlled. -Continue home medications we'll add when necessary hydralazine if needed.  Diabetes -Check hemoglobin A1c.  -continue basal insulin 20 units once a day plus sliding scale insulin while in the hospital.  DVT prophylaxis  -subcutaneous heparin  CODE STATUS -Full code as discussed with patient.   Time Spent on Admission: 75 minutes  HERNANDEZ ACOSTA,ESTELA [facility] Pager: [phone] 09/17/2014, 4:54 PM

## 2014-09-17 NOTE — ED Provider Notes (Signed)
CSN: [id number]     Arrival date & time 09/17/14  7026 History  This chart was scribed for Hoy Morn, MD by Lowella Petties, ED Scribe. The patient was seen in room APA07/APA07. Patient's care was started at 11:09 AM.   Chief Complaint  Patient presents with  . Back Pain   The history is provided by the patient. No language interpreter was used.   HPI Comments: Oscar Hickman is a 73 y.o. male who presents to the Emergency Department complaining of constant, severe, mid to lower back pain that has been bothering him for over a week. He additionally reports weakness difficulty walking due to his pain. His wife reports that he has not been eating normally, and that he has had some SOB with associated cough. He denies abdominal pain. He denies nausea, vomiting, or diarrhea. He reports that he was seen here 4 days ago, and diagnosed with a kidney infection. He reports taking a bactrim  that was prescribed at this time with minimal relief. UA culture from that visit demonstrates greater than 100,000 gram negative rods with sensitivity still pending.   Past Medical History  Diagnosis Date  . Skin infection     history  . HTN (hypertension) 11/23/2011  . High cholesterol   . Diabetes mellitus   . Chronic headache     daily  . Anxiety   . Osteoarthritis   . COPD (chronic obstructive pulmonary disease)     patient denies  . Peripheral neuropathy   . 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 CHF (congestive heart failure)   . Medical non-compliance   . Heart disease   . Cognitive dysfunction    Past Surgical History  Procedure Laterality Date  . Transurethral resection of prostate  03/2008  . Cardiac catheterization  02/2009    minimal non-obstructive CAD  . Cystoscopy w/ ureteral stent placement Right 04/06/2013    Procedure: CYSTOSCOPY WITH RETROGRADE PYELOGRAM/URETERAL STENT PLACEMENT;  Surgeon: Marissa Nestle, MD;  Location: [facility];  Service: Urology;   Laterality: Right;  . Cystoscopy w/ ureteral stent removal Right 04/06/2013    Procedure: CYSTOSCOPY WITH STENT REMOVAL;  Surgeon: Marissa Nestle, MD;  Location: [facility];  Service: Urology;  Laterality: Right;   Family History  Problem Relation Age of Onset  . Cancer Mother   . Cancer Father    History  Substance Use Topics  . Smoking status: Never Smoker   . Smokeless tobacco: Never Used  . Alcohol Use: No    Review of Systems  Respiratory: Positive for cough and shortness of breath.   Gastrointestinal: Negative for nausea, vomiting, abdominal pain and diarrhea.  Musculoskeletal: Positive for back pain.   A complete 10 system review of systems was obtained and all systems are negative except as noted in the HPI and PMH.   Allergies  Review of patient's allergies indicates no known allergies.  Home Medications   Prior to Admission medications   Medication Sig Start Date End Date Taking? Authorizing Provider  abiraterone Acetate (ZYTIGA) 250 MG tablet Take 4 tablets (1,000 mg total) by mouth daily. Take on an empty stomach 1 hour before or 2 hours after a meal 06/28/14   Baird Cancer, PA-C  acetaminophen (TYLENOL) 650 MG CR tablet Take 650 mg by mouth every 8 (eight) hours as needed for pain. Takes 2 for headache occasionally    Historical Provider, MD  albuterol (PROVENTIL) (2.5 MG/3ML) 0.083%  nebulizer solution Take 3 mLs (2.5 mg total) by nebulization every 2 (two) hours as needed for wheezing or shortness of breath. 11/29/13   [name], MD  amLODipine (NORVASC) 5 MG tablet Take 1 tablet (5 mg total) by mouth daily. 05/23/14   [name], MD  aspirin EC 81 MG tablet Take 81 mg by mouth daily.    Historical Provider, MD  carvedilol (COREG) 25 MG tablet Take 25 mg by mouth 2 (two) times daily with a meal.    Historical Provider, MD  cloNIDine (CATAPRES) 0.2 MG tablet Take 1 tablet (0.2 mg total) by mouth 2 (two) times daily. 11/17/13   [name], MD   ENSURE PLUS (ENSURE PLUS) LIQD Take 237 mLs by mouth daily.    Historical Provider, MD  furosemide (LASIX) 20 MG tablet Take 1 tablet (20 mg total) by mouth 2 (two) times daily. Patient not taking: Reported on 09/13/2014 07/02/14 09/13/14  Jasper Riling. Pickering, MD  furosemide (LASIX) 20 MG tablet Take 20 mg by mouth daily.    Historical Provider, MD  ibuprofen (ADVIL,MOTRIN) 200 MG tablet Take 400 mg by mouth every 6 (six) hours as needed for moderate pain.    Historical Provider, MD  insulin glargine (LANTUS) 100 UNIT/ML injection Inject 25-26 Units into the skin daily.     Historical Provider, MD  isosorbide dinitrate (ISORDIL) 20 MG tablet Take 20 mg by mouth 3 (three) times daily.  06/18/11   Historical Provider, MD  metFORMIN (GLUCOPHAGE) 500 MG tablet Take 500 mg by mouth 2 (two) times daily with a meal.     Historical Provider, MD  potassium chloride SA (K-DUR,KLOR-CON) 20 MEQ tablet Take 20 mEq by mouth daily.    Historical Provider, MD  predniSONE (DELTASONE) 5 MG tablet Take 1 tablet (5 mg total) by mouth 2 (two) times daily with a meal. 05/23/14   [name], MD  sulfamethoxazole-trimethoprim (BACTRIM DS,SEPTRA DS) 800-160 MG per tablet Take 1 tablet by mouth 2 (two) times daily. 09/13/14   Jasper Riling. Pickering, MD  tamsulosin (FLOMAX) 0.4 MG CAPS capsule Take 1 capsule (0.4 mg total) by mouth daily. 11/17/13   [name], MD   Triage Vitals: BP 167/112 mmHg  Pulse 110  Temp(Src) 98 F (36.7 C) (Oral)  Resp 18  Ht 5\' 9"  (1.753 m)  Wt 185 lb (83.915 kg)  BMI 27.31 kg/m2  SpO2 97% Physical Exam  Constitutional: He is oriented to person, place, and time. He appears well-developed and well-nourished.  HENT:  Head: Normocephalic and atraumatic.  Eyes: EOM are normal.  Neck: Normal range of motion.  Cardiovascular: Normal rate, regular rhythm, normal heart sounds and intact distal pulses.   Pulmonary/Chest: Effort normal and breath sounds normal. No respiratory distress.   Abdominal: Soft. He exhibits no distension. There is no tenderness.  Musculoskeletal: Normal range of motion.  No lumbar or thoracic tenderness. No significant para lumbar tenderness.  Neurological: He is alert and oriented to person, place, and time.  Skin: Skin is warm and dry.  Psychiatric: He has a normal mood and affect. Judgment normal.  Nursing note and vitals reviewed.   ED Course  Procedures (including critical care time) DIAGNOSTIC STUDIES: Oxygen Saturation is 97% on room air, normal by my interpretation.    COORDINATION OF CARE: 11:18 AM-Discussed treatment plan which includes CXR, UA, and lab work with pt at bedside and pt agreed to plan.   Labs Review Labs Reviewed  URINALYSIS, ROUTINE W  REFLEX MICROSCOPIC - Abnormal; Notable for the following:    APPearance HAZY (*)    Specific Gravity, Urine >1.030 (*)    Glucose, UA 100 (*)    Hgb urine dipstick LARGE (*)    Bilirubin Urine SMALL (*)    Ketones, ur TRACE (*)    Protein, ur 100 (*)    Urobilinogen, UA 2.0 (*)    Nitrite POSITIVE (*)    Leukocytes, UA SMALL (*)    All other components within normal limits  URINE MICROSCOPIC-ADD ON - Abnormal; Notable for the following:    Bacteria, UA MANY (*)    All other components within normal limits  CBC WITH DIFFERENTIAL - Abnormal; Notable for the following:    RBC 4.16 (*)    Hemoglobin 10.7 (*)    HCT 33.9 (*)    MCH 25.7 (*)    RDW 18.4 (*)    Monocytes Relative 13 (*)    All other components within normal limits  COMPREHENSIVE METABOLIC PANEL - Abnormal; Notable for the following:    Glucose, Bld 234 (*)    Albumin 3.0 (*)    GFR calc non Af Amer 81 (*)    All other components within normal limits  URINE CULTURE  CULTURE, BLOOD (ROUTINE X 2)  CULTURE, BLOOD (ROUTINE X 2)  TROPONIN I    Imaging Review Dg Chest 2 View  09/17/2014   CLINICAL DATA:  Cough.  Short of breath and back pain.  Vomiting.  EXAM: CHEST  2 VIEW  COMPARISON:  07/02/2014  FINDINGS:  Mild enlargement of the cardiopericardial silhouette. Aorta is uncoiled.  Pulmonary arteries and veins centrally all prominent as they were previously. No lung consolidation or overt edema. No pleural effusion or pneumothorax.  The bony thorax is demineralized but grossly intact.  IMPRESSION: 1. No change from prior study. Significant prominence of the pulmonary vasculature centrally common overt pulmonary edema. No evidence of pneumonia. Stable cardiomegaly.   Electronically Signed   By: Lajean Manes M.D.   On: 09/17/2014 14:08   Ct Abdomen Pelvis W Contrast  09/17/2014   CLINICAL DATA:  Bilateral lower back pain, nausea, vomiting  EXAM: CT ABDOMEN AND PELVIS WITH CONTRAST  TECHNIQUE: Multidetector CT imaging of the abdomen and pelvis was performed using the standard protocol following bolus administration of intravenous contrast.  CONTRAST:  41mL OMNIPAQUE IOHEXOL 300 MG/ML SOLN, 153mL OMNIPAQUE IOHEXOL 300 MG/ML SOLN  COMPARISON:  04/05/2013, 04/20/2012  FINDINGS: There are bilateral trace pleural effusions. There is mild bilateral interstitial thickening. There is cardiomegaly. There is coronary artery atherosclerosis.  There is a 10 mm hypodensity in the posterior right hepatic low which is too small to characterize. There is no intrahepatic or extrahepatic biliary ductal dilatation. The gallbladder is normal. The spleen demonstrates no focal abnormality. There is a right nephro ureteral stent with moderate right hydronephrosis. The proximal portion of the stent is within the proximal right ureter and terminating in the bladder. There is mild right perinephric and proximal periureteral stranding. The left kidney is unremarkable without obstructive uropathy. The adrenal glands and pancreas are normal. The bladder is unremarkable. Prior TURP.  The stomach, duodenum, small intestine, and large intestine demonstrate no bowel dilatation or wall thickening. There is a Forensic psychologist type umbilical hernia. There is no  pneumoperitoneum, pneumatosis, or portal venous gas. There is no abdominal or pelvic free fluid. There is no lymphadenopathy.  The abdominal aorta is normal in caliber with atherosclerosis.  There are no lytic or sclerotic  osseous lesions. There is a T11 vertebral body compression fracture of indeterminate age. There are mild broad-based disc bulges at L3-4, L4-5 and L5-S1.  IMPRESSION: 1. Age-indeterminate T11 vertebral body compression fracture. 2. Right ureteral stent is present. Moderate right hydronephrosis and perinephric stranding. Overall appearance is unchanged from the prior exams. 3. Cardiomegaly with bilateral pleural effusions and mild interstitial thickening concerning for mild CHF.   Electronically Signed   By: Kathreen Devoid   On: 09/17/2014 14:17  I personally reviewed the imaging tests through PACS system I reviewed available ER/hospitalization records through the EMR    EKG Interpretation None      MDM   Final diagnoses:  Cough  SOB (shortness of breath)  Abdominal pain  Back pain  UTI (lower urinary tract infection)  Hydronephrosis, right  Retained ureteral stent    Patient has recurrent pyelonephritis not treatedas outpatient.  Gram-negative rods on urine culture.  Sensitivities pending.  IV Zosyn given.  CT scan demonstrates a right sided hydronephrosis and hydroureter with retained right ureteral stent.  Best I can tell this is been in since May 2014.  I suspect this is one the reasons his urinary tract infection is not clearing.  He is nontoxic appearing.  He is not tachycardic or febrile.  White blood cell count is normal.  Repeat urine culture sent.  I think the patient benefit from admission to the hospital.  I discussed his case with the hospitalist who agrees with admission.  I discussed the case with Dr. Tresa Moore of urology who agrees to consult on the patient at [facility].  Patient be transferred to [facility]. Likely treatment if the patient does not  improve would be right sided nephrostomy tube and removal of the right ureteral stent at a later date   I personally performed the services described in this documentation, which was scribed in my presence. The recorded information has been reviewed and is accurate.      Hoy Morn, MD 09/17/14 [phone]

## 2014-09-17 NOTE — ED Notes (Signed)
Patient being transferred to [facility] by [facility] at this time

## 2014-09-17 NOTE — ED Notes (Signed)
Placed pt on cardiac monitor

## 2014-09-17 NOTE — ED Notes (Signed)
Patient's O2 saturation 87% on room air. Placed patient on 2L O2 via nasal cannula. O2 saturation increased to 95%.

## 2014-09-17 NOTE — Progress Notes (Signed)
Pt received from [facility] via [facility]. Report received and Pt oriented to floor and room

## 2014-09-17 NOTE — Plan of Care (Signed)
Problem: Consults Goal: General Medical Patient Education See Patient Education Module for specific education.  Outcome: Progressing     

## 2014-09-17 NOTE — ED Notes (Signed)
Patient vomiting at this time. Patient finished oral contrast approximately 1 hour ago.

## 2014-09-17 NOTE — ED Notes (Addendum)
Patient c/o severe mid to lower back pain. Denies any known injury. Patient reports hx of prostate cancer. Per patient was seen here last Friday for flank and abd pain and diagnosed with "infection." Patient unsure of what type of infection.

## 2014-09-18 ENCOUNTER — Telehealth (HOSPITAL_BASED_OUTPATIENT_CLINIC_OR_DEPARTMENT_OTHER): Payer: Self-pay | Admitting: Emergency Medicine

## 2014-09-18 LAB — CBC
HEMATOCRIT: 32.7 % — AB (ref 39.0–52.0)
Hemoglobin: 10 g/dL — ABNORMAL LOW (ref 13.0–17.0)
MCH: 25.1 pg — ABNORMAL LOW (ref 26.0–34.0)
MCHC: 30.6 g/dL (ref 30.0–36.0)
MCV: 82.2 fL (ref 78.0–100.0)
Platelets: 332 10*3/uL (ref 150–400)
RBC: 3.98 MIL/uL — AB (ref 4.22–5.81)
RDW: 18.6 % — ABNORMAL HIGH (ref 11.5–15.5)
WBC: 7.4 10*3/uL (ref 4.0–10.5)

## 2014-09-18 LAB — GLUCOSE, CAPILLARY
GLUCOSE-CAPILLARY: 39 mg/dL — AB (ref 70–99)
GLUCOSE-CAPILLARY: 86 mg/dL (ref 70–99)
GLUCOSE-CAPILLARY: 94 mg/dL (ref 70–99)
Glucose-Capillary: 204 mg/dL — ABNORMAL HIGH (ref 70–99)
Glucose-Capillary: 34 mg/dL — CL (ref 70–99)
Glucose-Capillary: 79 mg/dL (ref 70–99)
Glucose-Capillary: 47 mg/dL — ABNORMAL LOW (ref 70–99)
Glucose-Capillary: 96 mg/dL (ref 70–99)

## 2014-09-18 LAB — BASIC METABOLIC PANEL
Anion gap: 7 (ref 5–15)
BUN: 17 mg/dL (ref 6–23)
CO2: 30 meq/L (ref 19–32)
Calcium: 9.2 mg/dL (ref 8.4–10.5)
Chloride: 107 mEq/L (ref 96–112)
Creatinine, Ser: 0.98 mg/dL (ref 0.50–1.35)
GFR calc Af Amer: >90 mL/min (ref >90)
GFR calc non Af Amer: 80 mL/min — ABNORMAL LOW (ref >90)
GLUCOSE: 74 mg/dL (ref 70–99)
POTASSIUM: 4.3 meq/L (ref 3.7–5.3)
SODIUM: 144 meq/L (ref 137–147)

## 2014-09-18 LAB — URINE CULTURE
Colony Count: NO GROWTH
Culture: NO GROWTH

## 2014-09-18 MED ORDER — DEXTROSE 50 % IV SOLN
25.0000 mL | Freq: Once | INTRAVENOUS | Status: AC | PRN
  Administered 2014-09-18: 25 mL via INTRAVENOUS

## 2014-09-18 MED ORDER — DEXTROSE 50 % IV SOLN
INTRAVENOUS | Status: AC
Start: 2014-09-18 — End: 2014-09-18
  Filled 2014-09-18: qty 50

## 2014-09-18 MED ORDER — INSULIN DETEMIR 100 UNIT/ML ~~LOC~~ SOLN
5.0000 [IU] | Freq: Every day | SUBCUTANEOUS | Status: DC
  Administered 2014-09-18 – 2014-09-24 (×7): 5 [IU] via SUBCUTANEOUS
  Filled 2014-09-18 (×10): qty 0.05

## 2014-09-18 MED ORDER — DEXTROSE 50 % IV SOLN
INTRAVENOUS | Status: AC
  Filled 2014-09-18: qty 50

## 2014-09-18 MED ORDER — KCL IN DEXTROSE-NACL 20-5-0.45 MEQ/L-%-% IV SOLN
INTRAVENOUS | Status: DC
  Administered 2014-09-18 – 2014-09-21 (×8): via INTRAVENOUS
  Filled 2014-09-18 (×20): qty 1000

## 2014-09-18 NOTE — Progress Notes (Signed)
Inpatient Diabetes Program Recommendations  AACE/ADA: New Consensus Statement on Inpatient Glycemic Control (2013)  Target Ranges:  Prepandial:   less than 140 mg/dL      Peak postprandial:   less than 180 mg/dL (1-2 hours)      Critically ill patients:  140 - 180 mg/dL     Results for Oscar Hickman, Oscar Hickman (MRN [id number]) as of 09/18/2014 11:18  Ref. Range 09/18/2014 08:32 09/18/2014 08:55 09/18/2014 10:41  Glucose-Capillary Latest Range: 70-99 mg/dL 34 (LL) 79 39 (LL)    Results for Oscar Hickman, Oscar Hickman (MRN [id number]) as of 09/18/2014 11:18  Ref. Range 09/17/2014 18:58  Hgb A1c MFr Bld Latest Range: <5.7 % 7.6 (H)     Admitted with Pyelonephritis.  History of DM, HTN, CHF   Home DM Meds: Lantus 25-26 units daily       Metformin 500 mg bid   Current Insulin Orders: Levemir 20 units QHS (started last PM)      Novolog Moderate SSI      Novolog 4 units tid with meals   **Patient received 5 units Novolog last night at bedtime along with Lantus 20 units.  **Hypoglycemic this AM x 2 events.   MD- Please consider the following insulin adjustments:  1. Change Levemir to Lantus and decrease dose to 15 units QHS (patient takes Lantus at home) 2. Decrease Novolog SSI to Sensitive scale (currently ordered as Moderate scale)    Will follow Wyn Quaker RN, MSN, CDE Diabetes Coordinator Inpatient Diabetes Program Team Pager: [phone] (8a-10p)

## 2014-09-18 NOTE — Plan of Care (Signed)
Problem: Phase II Progression Outcomes Goal: Vital signs remain stable Outcome: Completed/Met Date Met:  09/18/14  Problem: Phase III Progression Outcomes Goal: Pain controlled on oral analgesia Outcome: Completed/Met Date Met:  09/18/14

## 2014-09-18 NOTE — Plan of Care (Signed)
Problem: Phase I Progression Outcomes Goal: Pain controlled with appropriate interventions Outcome: Completed/Met Date Met:  09/18/14 Goal: Voiding-avoid urinary catheter unless indicated Outcome: Completed/Met Date Met:  09/18/14

## 2014-09-18 NOTE — Progress Notes (Signed)
Hypoglycemic Event  CBG: 39  Treatment: D50 IV 25 mL  Symptoms: Hungry  Follow-up CBG: Time: 1132 CBG Result: 86  Possible Reasons for Event: Inadequate meal intake  Comments/MD notified: Dr. Wendee Beavers orders received to change IV fluids    Oscar Hickman, [name]  Remember to initiate Hypoglycemia Order Set & complete

## 2014-09-18 NOTE — Progress Notes (Signed)
[facility] PROGRESS NOTE  Oscar Hickman CNO:[id number] DOB: 30-Mar-1941 DOA: 09/17/2014 PCP: [name], MD  Assessment/Plan: Principal Problem:   Pyelonephritis - urine culture growing Klebsiella - Repeat urine culture pending - Continue Unasyn - Contacted urology given history of stent  Active Problems:   ADENOCARCINOMA, PROSTATE - patient to continue routine follow-up after discharge    DM type 2 causing complication - patient had hypoglycemic episode - Will decrease levemir from 20 units to 5 units. - continue to monitor blood sugars.    HTN (hypertension) - currently not well controlled.suspect pain is also contributing. - Patient currently on carvedilol, clonidine, amlodipine, Isordil - patient is on various blood pressure medications. Will avoid increasing blood pressure medications while patient is in pain. Once pain subsides and improves if blood pressures elevated consistently we'll plan on increasing antihypertensive regimen    Hydronephrosis of right kidney - Urology consulted    Chronic systolic CHF (congestive heart failure) - currently compensated we'll continue current regimen  Code Status: full Family Communication: No family at bedside Disposition Plan: Pending improvement in condition.   Consultants:  urology  Procedures:  none  Antibiotics:  Unasyn  HPI/Subjective: Patient has no new complaints.  Objective: Filed Vitals:   09/18/14 1404  BP: 156/101  Pulse: 76  Temp: 97.4 F (36.3 C)  Resp: 20    Intake/Output Summary (Last 24 hours) at 09/18/14 1711 Last data filed at 09/18/14 1332  Gross per 24 hour  Intake     50 ml  Output    300 ml  Net   -250 ml   Filed Weights   09/17/14 1830 09/17/14 1837 09/18/14 0541  Weight: 82 kg (180 lb 12.4 oz) 82 kg (180 lb 12.4 oz) 82 kg (180 lb 12.4 oz)    Exam:   General:  Patient in no acute distress, alert and awake, patient looks uncomfortable  Cardiovascular:  regular rate and rhythm, no murmurs or rubs  Respiratory: clear to auscultation bilaterally, no wheezes  Abdomen: soft, nondistended, no guarding  Musculoskeletal: no cyanosis or clubbing   Data Reviewed: Basic Metabolic Panel:  Recent Labs Lab 09/13/14 1035 09/17/14 1131 09/17/14 1858 09/18/14 0446  NA 144 142  --  144  K 3.7 4.0  --  4.3  CL 104 102  --  107  CO2 28 28  --  30  GLUCOSE 114* 234*  --  74  BUN 13 17  --  17  CREATININE 0.77 0.96 0.85 0.98  CALCIUM 9.1 9.2  --  9.2   Liver Function Tests:  Recent Labs Lab 09/17/14 1131  AST 13  ALT 11  ALKPHOS 99  BILITOT 0.6  PROT 6.7  ALBUMIN 3.0*   No results for input(s): LIPASE, AMYLASE in the last 168 hours. No results for input(s): AMMONIA in the last 168 hours. CBC:  Recent Labs Lab 09/13/14 1035 09/17/14 1131 09/17/14 1858 09/18/14 0446  WBC 7.2 6.6 6.3 7.4  NEUTROABS 5.1 4.0  --   --   HGB 10.7* 10.7* 10.7* 10.0*  HCT 34.1* 33.9* 34.6* 32.7*  MCV 81.2 81.5 81.4 82.2  PLT 369 363 349 332   Cardiac Enzymes:  Recent Labs Lab 09/17/14 1131  TROPONINI <0.30   BNP (last 3 results)  Recent Labs  11/27/13 0554 05/19/14 0845 07/02/14 1326  PROBNP 1323.0* 7039.0* 5754.0*   CBG:  Recent Labs Lab 09/18/14 0832 09/18/14 0855 09/18/14 1041 09/18/14 1132 09/18/14 1637  GLUCAP 34* 79 39* 86 47*  Recent Results (from the past 240 hour(s))  Urine culture     Status: None   Collection Time: 09/13/14  3:42 PM  Result Value Ref Range Status   Specimen Description URINE, CATHETERIZED  Final   Special Requests NONE  Final   Culture  Setup Time   Final    09/13/2014 22:31 Performed at [place]   Final    >=100,000 COLONIES/ML Performed at [place] OXYTOCA Performed at [facility]    Report Status 09/17/2014 FINAL  Final   Organism ID, Bacteria KLEBSIELLA OXYTOCA  Final      Susceptibility    Klebsiella oxytoca - MIC*    AMPICILLIN >=32 RESISTANT Resistant     CEFAZOLIN >=64 RESISTANT Resistant     CEFTRIAXONE <=1 SENSITIVE Sensitive     CIPROFLOXACIN <=0.25 SENSITIVE Sensitive     GENTAMICIN <=1 SENSITIVE Sensitive     LEVOFLOXACIN <=0.12 SENSITIVE Sensitive     NITROFURANTOIN <=16 SENSITIVE Sensitive     TOBRAMYCIN <=1 SENSITIVE Sensitive     TRIMETH/SULFA <=20 SENSITIVE Sensitive     PIP/TAZO <=4 SENSITIVE Sensitive     * KLEBSIELLA OXYTOCA     Studies: Dg Chest 2 View  09/17/2014   CLINICAL DATA:  Cough.  Short of breath and back pain.  Vomiting.  EXAM: CHEST  2 VIEW  COMPARISON:  07/02/2014  FINDINGS: Mild enlargement of the cardiopericardial silhouette. Aorta is uncoiled.  Pulmonary arteries and veins centrally all prominent as they were previously. No lung consolidation or overt edema. No pleural effusion or pneumothorax.  The bony thorax is demineralized but grossly intact.  IMPRESSION: 1. No change from prior study. Significant prominence of the pulmonary vasculature centrally common overt pulmonary edema. No evidence of pneumonia. Stable cardiomegaly.   Electronically Signed   By: Lajean Manes M.D.   On: 09/17/2014 14:08   Ct Abdomen Pelvis W Contrast  09/17/2014   CLINICAL DATA:  Bilateral lower back pain, nausea, vomiting  EXAM: CT ABDOMEN AND PELVIS WITH CONTRAST  TECHNIQUE: Multidetector CT imaging of the abdomen and pelvis was performed using the standard protocol following bolus administration of intravenous contrast.  CONTRAST:  12mL OMNIPAQUE IOHEXOL 300 MG/ML SOLN, 121mL OMNIPAQUE IOHEXOL 300 MG/ML SOLN  COMPARISON:  04/05/2013, 04/20/2012  FINDINGS: There are bilateral trace pleural effusions. There is mild bilateral interstitial thickening. There is cardiomegaly. There is coronary artery atherosclerosis.  There is a 10 mm hypodensity in the posterior right hepatic low which is too small to characterize. There is no intrahepatic or extrahepatic biliary ductal  dilatation. The gallbladder is normal. The spleen demonstrates no focal abnormality. There is a right nephro ureteral stent with moderate right hydronephrosis. The proximal portion of the stent is within the proximal right ureter and terminating in the bladder. There is mild right perinephric and proximal periureteral stranding. The left kidney is unremarkable without obstructive uropathy. The adrenal glands and pancreas are normal. The bladder is unremarkable. Prior TURP.  The stomach, duodenum, small intestine, and large intestine demonstrate no bowel dilatation or wall thickening. There is a Forensic psychologist type umbilical hernia. There is no pneumoperitoneum, pneumatosis, or portal venous gas. There is no abdominal or pelvic free fluid. There is no lymphadenopathy.  The abdominal aorta is normal in caliber with atherosclerosis.  There are no lytic or sclerotic osseous lesions. There is a T11 vertebral body compression fracture of indeterminate age. There  are mild broad-based disc bulges at L3-4, L4-5 and L5-S1.  IMPRESSION: 1. Age-indeterminate T11 vertebral body compression fracture. 2. Right ureteral stent is present. Moderate right hydronephrosis and perinephric stranding. Overall appearance is unchanged from the prior exams. 3. Cardiomegaly with bilateral pleural effusions and mild interstitial thickening concerning for mild CHF.   Electronically Signed   By: Kathreen Devoid   On: 09/17/2014 14:17    Scheduled Meds: . amLODipine  5 mg Oral Daily  . aspirin EC  81 mg Oral Daily  . carvedilol  25 mg Oral BID WC  . cloNIDine  0.2 mg Oral BID  . heparin  5,000 Units Subcutaneous 3 times per day  . insulin aspart  0-15 Units Subcutaneous TID WC  . insulin aspart  4 Units Subcutaneous TID WC  . insulin detemir  20 Units Subcutaneous QHS  . isosorbide dinitrate  20 mg Oral TID  . piperacillin-tazobactam (ZOSYN)  IV  3.375 g Intravenous 3 times per day  . predniSONE  5 mg Oral BID WC  . tamsulosin  0.4 mg Oral  Daily   Continuous Infusions: . dextrose 5 % and 0.45 % NaCl with KCl 20 mEq/L 100 mL/hr at 09/18/14 1131     Time spent: > 35 minutes    Oscar Hickman  [facility] Pager [phone] If 7PM-7AM, please contact night-coverage at [email or web address], password [facility] 09/18/2014, 5:11 PM  LOS: 1 day

## 2014-09-18 NOTE — Progress Notes (Signed)
Hypoglycemic Event  CBG: 34  Treatment: D50 IV 25 mL  Symptoms: none  Follow-up CBG: Time: 0855 CBG Result: 79  Possible Reasons for Event: Inadequate meal intake (NPO)  Comments/MD notified:    Stacey Drain  Remember to initiate Hypoglycemia Order Set & complete

## 2014-09-18 NOTE — Consult Note (Signed)
Urology Consult   Physician requesting consult: Dr. Wendee Beavers  Reason for consult: Right ureteral stent and right hydronephrosis  History of Present Illness: Oscar Hickman is a 73 y.o. gentleman who has been followed by Dr. Michela Pitcher for metastatic prostate cancer and right ureteral obstruction.  He apparently has chronic right ureteral obstruction that has been managed with right ureteral stent drainage. It appears that his last stent change was in May 2014. He was originally diagnosed with Gleason 9 prostate cancer and was treated with radiation therapy but developed metastatic disease.  He is now followed by medical oncology at [facility] and has known castrate resistant metastatic prostate cancer treated with Lupron every 3 months (last given in September 2015) and abiraterone/prednisone.  He has not followed up with Dr. Michela Pitcher after his last stent change in 2014.  He had been in the [place] on 11/6 with bilateral back pain and abdominal pain.  He was diagnosed with a UTI and was not febrile.  He was found to have a pansensitive Klebsiella and was treated with Bactrim. He apparently presented to the [facility] emergency room yesterday with complaints of abdominal pain and back pain. Supposedly, the pain complaints were localized to the right flank and right abdomen. A CT scan demonstrated an indwelling ureteral stent (placed in May 2014 according to EMR records) with stable right hydronephrosis with the proximal curl of the stent within the proximal ureter.  The emergency room physicians apparently had talked with Dr. Tresa Moore who recommended the [facility].  The patient was transferred yesterday afternoon but Dr. Tresa Moore was apparently not notified of his admission yesterday. I was called today to address his urologic issues.  On talking with the patient, he actually states that he has been having pain bilaterally across his back and bilaterally across  his flanks and abdomen. He does not localize his pain to his right flank or abdomen today. He has not been febrile and his renal function has been normal.  His most recent PSA was 1.65 in September which was a slight increase from 0.95 in June and has been steadily but slowly rising over the past year.  His last bone scan was in 2013 and was negative for osseous metastatic disease.  The patient is a relatively poor historian and the majority of the objective facts have been obtained from the patient's records.   Past Medical History  Diagnosis Date  . Skin infection     history  . HTN (hypertension) 11/23/2011  . High cholesterol   . Diabetes mellitus   . Chronic headache     daily  . Anxiety   . Osteoarthritis   . COPD (chronic obstructive pulmonary disease)     patient denies  . Peripheral neuropathy   . 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 CHF (congestive heart failure)   . Medical non-compliance   . Heart disease   . Cognitive dysfunction     Past Surgical History  Procedure Laterality Date  . Transurethral resection of prostate  03/2008  . Cardiac catheterization  02/2009    minimal non-obstructive CAD  . Cystoscopy w/ ureteral stent placement Right 04/06/2013    Procedure: CYSTOSCOPY WITH RETROGRADE PYELOGRAM/URETERAL STENT PLACEMENT;  Surgeon: Marissa Nestle, MD;  Location: [facility];  Service: Urology;  Laterality: Right;  . Cystoscopy w/ ureteral stent removal Right 04/06/2013    Procedure: CYSTOSCOPY WITH STENT REMOVAL;  Surgeon: Marissa Nestle, MD;  Location: [facility];  Service: Urology;  Laterality: Right;    Current Hospital Medications:  Home Meds:    Medication List    ASK your doctor about these medications        abiraterone Acetate 250 MG tablet  Commonly known as:  ZYTIGA  Take 4 tablets (1,000 mg total) by mouth daily. Take on an empty stomach 1 hour before or 2 hours after a meal     acetaminophen 650 MG CR tablet   Commonly known as:  TYLENOL  Take 650 mg by mouth every 8 (eight) hours as needed for pain. Takes 2 for headache occasionally     albuterol (2.5 MG/3ML) 0.083% nebulizer solution  Commonly known as:  PROVENTIL  Take 3 mLs (2.5 mg total) by nebulization every 2 (two) hours as needed for wheezing or shortness of breath.     amLODipine 5 MG tablet  Commonly known as:  NORVASC  Take 1 tablet (5 mg total) by mouth daily.     aspirin EC 81 MG tablet  Take 81 mg by mouth daily.     carvedilol 25 MG tablet  Commonly known as:  COREG  Take 25 mg by mouth 2 (two) times daily with a meal.     cloNIDine 0.2 MG tablet  Commonly known as:  CATAPRES  Take 1 tablet (0.2 mg total) by mouth 2 (two) times daily.     ENSURE PLUS Liqd  Take 237 mLs by mouth daily.     furosemide 20 MG tablet  Commonly known as:  LASIX  Take 20 mg by mouth daily.     furosemide 20 MG tablet  Commonly known as:  LASIX  Take 1 tablet (20 mg total) by mouth 2 (two) times daily.     ibuprofen 200 MG tablet  Commonly known as:  ADVIL,MOTRIN  Take 400 mg by mouth every 6 (six) hours as needed for moderate pain.     insulin glargine 100 UNIT/ML injection  Commonly known as:  LANTUS  Inject 25-26 Units into the skin daily.     isosorbide dinitrate 20 MG tablet  Commonly known as:  ISORDIL  Take 20 mg by mouth 3 (three) times daily.     metFORMIN 500 MG tablet  Commonly known as:  GLUCOPHAGE  Take 500 mg by mouth 2 (two) times daily with a meal.     potassium chloride SA 20 MEQ tablet  Commonly known as:  K-DUR,KLOR-CON  Take 20 mEq by mouth daily.     predniSONE 5 MG tablet  Commonly known as:  DELTASONE  Take 1 tablet (5 mg total) by mouth 2 (two) times daily with a meal.     sulfamethoxazole-trimethoprim 800-160 MG per tablet  Commonly known as:  BACTRIM DS,SEPTRA DS  Take 1 tablet by mouth 2 (two) times daily.     tamsulosin 0.4 MG Caps capsule  Commonly known as:  FLOMAX  Take 1 capsule (0.4 mg  total) by mouth daily.        Scheduled Meds: . amLODipine  5 mg Oral Daily  . aspirin EC  81 mg Oral Daily  . carvedilol  25 mg Oral BID WC  . cloNIDine  0.2 mg Oral BID  . heparin  5,000 Units Subcutaneous 3 times per day  . insulin aspart  0-15 Units Subcutaneous TID WC  . insulin aspart  4 Units Subcutaneous TID WC  . insulin detemir  20 Units Subcutaneous QHS  .  isosorbide dinitrate  20 mg Oral TID  . piperacillin-tazobactam (ZOSYN)  IV  3.375 g Intravenous 3 times per day  . predniSONE  5 mg Oral BID WC  . tamsulosin  0.4 mg Oral Daily   Continuous Infusions: . dextrose 5 % and 0.45 % NaCl with KCl 20 mEq/L 100 mL/hr at 09/18/14 1131   PRN Meds:.acetaminophen **OR** acetaminophen, albuterol, hydrALAZINE, ondansetron **OR** ondansetron (ZOFRAN) IV, oxyCODONE, senna-docusate  Allergies: No Known Allergies  Family History  Problem Relation Age of Onset  . Cancer Mother   . Cancer Father     Social History:  reports that he has never smoked. He has never used smokeless tobacco. He reports that he does not drink alcohol or use illicit drugs.  ROS: A complete review of systems was performed.  All systems are negative except for pertinent findings as noted.  Physical Exam:  Vital signs in last 24 hours: Temp:  [97.4 F (36.3 C)-98.6 F (37 C)] 97.4 F (36.3 C) (11/11 1404) Pulse Rate:  [76-100] 76 (11/11 1404) Resp:  [18-20] 20 (11/11 1404) BP: (118-190)/(76-119) 156/101 mmHg (11/11 1404) SpO2:  [91 %-100 %] 100 % (11/11 1404) Weight:  [82 kg (180 lb 12.4 oz)] 82 kg (180 lb 12.4 oz) (11/11 0541) Constitutional:  Alert and oriented, No acute distress Cardiovascular: Regular rate and rhythm, No JVD Respiratory: Normal respiratory effort, Lungs clear bilaterally GI: He has mild diffuse abdominal pain with rebound tenderness or guarding.  GU: He has minimal bilateral CVA tenderness.  He does not demonstrate predominant right flank pain symptoms on exam. Lymphatic: No  lymphadenopathy Neurologic: Grossly intact, no focal deficits Psychiatric: Normal mood and affect  Laboratory Data:   Recent Labs  09/17/14 1131 09/17/14 1858 09/18/14 0446  WBC 6.6 6.3 7.4  HGB 10.7* 10.7* 10.0*  HCT 33.9* 34.6* 32.7*  PLT 363 349 332   Lab Results  Component Value Date   PSA 1.65 07/25/2014   PSA 0.95 04/23/2014   PSA 0.75 01/21/2014     Recent Labs  09/17/14 1131 09/17/14 1858 09/18/14 0446  NA 142  --  144  K 4.0  --  4.3  CL 102  --  107  GLUCOSE 234*  --  74  BUN 17  --  17  CALCIUM 9.2  --  9.2  CREATININE 0.96 0.85 0.98    Recent Results (from the past 240 hour(s))  Urine culture     Status: None   Collection Time: 09/13/14  3:42 PM  Result Value Ref Range Status   Specimen Description URINE, CATHETERIZED  Final   Special Requests NONE  Final   Culture  Setup Time   Final    09/13/2014 22:31 Performed at [facility]   Final    >=100,000 COLONIES/ML Performed at [facility]   Final    KLEBSIELLA OXYTOCA Performed at [facility]    Report Status 09/17/2014 FINAL  Final   Organism ID, Bacteria KLEBSIELLA OXYTOCA  Final      Susceptibility   Klebsiella oxytoca - MIC*    AMPICILLIN >=32 RESISTANT Resistant     CEFAZOLIN >=64 RESISTANT Resistant     CEFTRIAXONE <=1 SENSITIVE Sensitive     CIPROFLOXACIN <=0.25 SENSITIVE Sensitive     GENTAMICIN <=1 SENSITIVE Sensitive     LEVOFLOXACIN <=0.12 SENSITIVE Sensitive     NITROFURANTOIN <=16 SENSITIVE Sensitive     TOBRAMYCIN <=1 SENSITIVE Sensitive  TRIMETH/SULFA <=20 SENSITIVE Sensitive     PIP/TAZO <=4 SENSITIVE Sensitive     * KLEBSIELLA OXYTOCA    Renal Function:  Recent Labs  09/13/14 1035 09/17/14 1131 09/17/14 1858 09/18/14 0446  CREATININE 0.77 0.96 0.85 0.98   Estimated Creatinine Clearance: 68.1 mL/min (by C-G formula based on Cr of 0.98).  Radiologic Imaging: Dg Chest 2 View  09/17/2014   CLINICAL  DATA:  Cough.  Short of breath and back pain.  Vomiting.  EXAM: CHEST  2 VIEW  COMPARISON:  07/02/2014  FINDINGS: Mild enlargement of the cardiopericardial silhouette. Aorta is uncoiled.  Pulmonary arteries and veins centrally all prominent as they were previously. No lung consolidation or overt edema. No pleural effusion or pneumothorax.  The bony thorax is demineralized but grossly intact.  IMPRESSION: 1. No change from prior study. Significant prominence of the pulmonary vasculature centrally common overt pulmonary edema. No evidence of pneumonia. Stable cardiomegaly.   Electronically Signed   By: Lajean Manes M.D.   On: 09/17/2014 14:08   Ct Abdomen Pelvis W Contrast  09/17/2014   CLINICAL DATA:  Bilateral lower back pain, nausea, vomiting  EXAM: CT ABDOMEN AND PELVIS WITH CONTRAST  TECHNIQUE: Multidetector CT imaging of the abdomen and pelvis was performed using the standard protocol following bolus administration of intravenous contrast.  CONTRAST:  84mL OMNIPAQUE IOHEXOL 300 MG/ML SOLN, 165mL OMNIPAQUE IOHEXOL 300 MG/ML SOLN  COMPARISON:  04/05/2013, 04/20/2012  FINDINGS: There are bilateral trace pleural effusions. There is mild bilateral interstitial thickening. There is cardiomegaly. There is coronary artery atherosclerosis.  There is a 10 mm hypodensity in the posterior right hepatic low which is too small to characterize. There is no intrahepatic or extrahepatic biliary ductal dilatation. The gallbladder is normal. The spleen demonstrates no focal abnormality. There is a right nephro ureteral stent with moderate right hydronephrosis. The proximal portion of the stent is within the proximal right ureter and terminating in the bladder. There is mild right perinephric and proximal periureteral stranding. The left kidney is unremarkable without obstructive uropathy. The adrenal glands and pancreas are normal. The bladder is unremarkable. Prior TURP.  The stomach, duodenum, small intestine, and large  intestine demonstrate no bowel dilatation or wall thickening. There is a Forensic psychologist type umbilical hernia. There is no pneumoperitoneum, pneumatosis, or portal venous gas. There is no abdominal or pelvic free fluid. There is no lymphadenopathy.  The abdominal aorta is normal in caliber with atherosclerosis.  There are no lytic or sclerotic osseous lesions. There is a T11 vertebral body compression fracture of indeterminate age. There are mild broad-based disc bulges at L3-4, L4-5 and L5-S1.  IMPRESSION: 1. Age-indeterminate T11 vertebral body compression fracture. 2. Right ureteral stent is present. Moderate right hydronephrosis and perinephric stranding. Overall appearance is unchanged from the prior exams. 3. Cardiomegaly with bilateral pleural effusions and mild interstitial thickening concerning for mild CHF.   Electronically Signed   By: Kathreen Devoid   On: 09/17/2014 14:17    I independently reviewed the above imaging studies.  Impression/Recommendation  1) Castrate resistant metastatic prostate cancer: Pt should continue treatment and follow up with his oncologist at [facility].  He does not have evidence of metastatic disease to the spine on his CT scan to explain his back pain symptoms.  2) Back pain: His pain is diffuse and bilateral and I do not think this is related to his right ureteral obstruction or right ureteral stent at all. Could this possibly be related to his vertebral fracture  or lumbar disc disease?   3) UTI: He should continue and complete appropriate treatment for his Klebsiella UTI.  Considering his has been receiving appropriate treatment and he continues to have back/abdominal pain, I do not suspect this is the source of his pain symptoms.  4) Right ureteral obstruction: According to the notes, he has chronic obstruction related to his advanced prostate cancer.  He is overdue for right ureteral stent change but this does not appear to be an acute issue that requires intervention  today. His renal function is normal, his hydronephrosis is chronic, and his acute pain symptoms do not suggest his stent as the cause.  I would recommend he follow up with his primary urologist or to establish ongoing care with a new urologist - either with me in [place] or at [facility] (we now have urologists from [place] two days per week).  He will need to be scheduled electively then for cystoscopy and right ureteral stent change and will need ongoing urologic management of his right ureteral obstruction.  Forrester Blando,LES 09/18/2014, 4:59 PM    Pryor Curia MD   CC: Dr. Wendee Beavers

## 2014-09-18 NOTE — Progress Notes (Signed)
Hypoglycemic Event  CBG:47  Treatment: D50 IV 25 mL  Symptoms: None  Follow-up CBG: Time:1728  CBG Result:94    Possible Reasons for Event: Inadequate meal intake  Comments/MD notified:    Stacey Drain  Remember to initiate Hypoglycemia Order Set & complete

## 2014-09-18 NOTE — Telephone Encounter (Signed)
Post ED Visit - Positive Culture Follow-up  Culture report reviewed by antimicrobial stewardship pharmacist: []  Sundra Aland, Pharm.D., BCPS []  Heide Guile, Pharm.D., BCPS []  Alycia Rossetti, Pharm.D., BCPS []  [place], Pharm.D., BCPS, AAHIVP []  Legrand Como, Pharm.D., BCPS, AAHIVP [x]  Elicia Lamp, Pharm.D.   Positive urine culture  klebsiella Treated with bactrim tabs one bid x 21 days, organism sensitive to the same and no further patient follow-up is required at this time.  Hazle Nordmann 09/18/2014, 4:41 PM

## 2014-09-19 ENCOUNTER — Inpatient Hospital Stay (HOSPITAL_COMMUNITY): Payer: Medicare Other

## 2014-09-19 DIAGNOSIS — M4854XA Collapsed vertebra, not elsewhere classified, thoracic region, initial encounter for fracture: Secondary | ICD-10-CM

## 2014-09-19 LAB — GLUCOSE, CAPILLARY
GLUCOSE-CAPILLARY: 149 mg/dL — AB (ref 70–99)
GLUCOSE-CAPILLARY: 171 mg/dL — AB (ref 70–99)
Glucose-Capillary: 138 mg/dL — ABNORMAL HIGH (ref 70–99)
Glucose-Capillary: 207 mg/dL — ABNORMAL HIGH (ref 70–99)

## 2014-09-19 MED ORDER — CIPROFLOXACIN HCL 500 MG PO TABS
500.0000 mg | ORAL_TABLET | Freq: Two times a day (BID) | ORAL | Status: DC
  Administered 2014-09-19 – 2014-09-25 (×12): 500 mg via ORAL
  Filled 2014-09-19 (×16): qty 1

## 2014-09-19 NOTE — Plan of Care (Signed)
Problem: Phase I Progression Outcomes Goal: OOB as tolerated unless otherwise ordered Outcome: Completed/Met Date Met:  09/19/14     

## 2014-09-19 NOTE — Progress Notes (Signed)
[facility] PROGRESS NOTE  Oscar Hickman CWC:[id number] DOB: 08/07/1941 DOA: 09/17/2014 PCP: [name], MD  Assessment/Plan: Principal Problem:   Pyelonephritis - urine culture growing Klebsiella - Repeat urine culture reporting no growth - Will discontinue IV antibiotic and place on oral Cipro - Contacted urology given history of stent and currently no plans for surgery while in house. Will have patient f/u with urologist as outpatient for further evaluation and recommendations.  Active Problems:   ADENOCARCINOMA, PROSTATE - patient to continue routine follow-up after discharge    DM type 2 causing complication - patient had hypoglycemic episode - Decreased levemir from 20 units to 5 units and currently improved blood sugar values. - continue to monitor blood sugars.    HTN (hypertension) - currently not well controlled suspect pain is also contributing. - Patient currently on carvedilol, clonidine, amlodipine, Isordil - patient is on various blood pressure medications. Will avoid increasing blood pressure medications while patient is in pain. Once pain subsides and improves if blood pressures elevated consistently we'll plan on increasing antihypertensive regimen  Low back pain - urologist reporting concern that we should exclude metastatic prostate ca.  - lumbar x-ray reports compression fracture of T11 new since 04/05/2013 - Will obtain a PT consult    Hydronephrosis of right kidney - Urology consulted    Chronic systolic CHF (congestive heart failure) - currently compensated we'll continue current regimen  Code Status: full Family Communication: No family at bedside Disposition Plan: Pending improvement in condition.   Consultants:  urology  Procedures:  none  Antibiotics:  Unasyn  HPI/Subjective: Patient has no new complaints feels better today.  Objective: Filed Vitals:   09/19/14 1327  BP: 140/98  Pulse: 78  Temp: 97.6 F (36.4  C)  Resp: 19    Intake/Output Summary (Last 24 hours) at 09/19/14 1415 Last data filed at 09/19/14 1130  Gross per 24 hour  Intake 2188.33 ml  Output    750 ml  Net 1438.33 ml   Filed Weights   09/17/14 1837 09/18/14 0541 09/19/14 0457  Weight: 82 kg (180 lb 12.4 oz) 82 kg (180 lb 12.4 oz) 83.6 kg (184 lb 4.9 oz)    Exam:   General:  Patient in no acute distress, alert and awake, patient looks uncomfortable  Cardiovascular: regular rate and rhythm, no murmurs or rubs  Respiratory: clear to auscultation bilaterally, no wheezes  Abdomen: soft, nondistended, no guarding  Musculoskeletal: no cyanosis or clubbing   Data Reviewed: Basic Metabolic Panel:  Recent Labs Lab 09/13/14 1035 09/17/14 1131 09/17/14 1858 09/18/14 0446  NA 144 142  --  144  K 3.7 4.0  --  4.3  CL 104 102  --  107  CO2 28 28  --  30  GLUCOSE 114* 234*  --  74  BUN 13 17  --  17  CREATININE 0.77 0.96 0.85 0.98  CALCIUM 9.1 9.2  --  9.2   Liver Function Tests:  Recent Labs Lab 09/17/14 1131  AST 13  ALT 11  ALKPHOS 99  BILITOT 0.6  PROT 6.7  ALBUMIN 3.0*   No results for input(s): LIPASE, AMYLASE in the last 168 hours. No results for input(s): AMMONIA in the last 168 hours. CBC:  Recent Labs Lab 09/13/14 1035 09/17/14 1131 09/17/14 1858 09/18/14 0446  WBC 7.2 6.6 6.3 7.4  NEUTROABS 5.1 4.0  --   --   HGB 10.7* 10.7* 10.7* 10.0*  HCT 34.1* 33.9* 34.6* 32.7*  MCV 81.2 81.5  81.4 82.2  PLT 369 363 349 332   Cardiac Enzymes:  Recent Labs Lab 09/17/14 1131  TROPONINI <0.30   BNP (last 3 results)  Recent Labs  11/27/13 0554 05/19/14 0845 07/02/14 1326  PROBNP 1323.0* 7039.0* 5754.0*   CBG:  Recent Labs Lab 09/18/14 1637 09/18/14 1728 09/18/14 2228 09/19/14 0753 09/19/14 1152  GLUCAP 47* 94 96 149* 138*    Recent Results (from the past 240 hour(s))  Urine culture     Status: None   Collection Time: 09/13/14  3:42 PM  Result Value Ref Range Status    Specimen Description URINE, CATHETERIZED  Final   Special Requests NONE  Final   Culture  Setup Time   Final    09/13/2014 22:31 Performed at [facility]   Final    >=100,000 COLONIES/ML Performed at [facility] OXYTOCA Performed at [facility]    Report Status 09/17/2014 FINAL  Final   Organism ID, Bacteria KLEBSIELLA OXYTOCA  Final      Susceptibility   Klebsiella oxytoca - MIC*    AMPICILLIN >=32 RESISTANT Resistant     CEFAZOLIN >=64 RESISTANT Resistant     CEFTRIAXONE <=1 SENSITIVE Sensitive     CIPROFLOXACIN <=0.25 SENSITIVE Sensitive     GENTAMICIN <=1 SENSITIVE Sensitive     LEVOFLOXACIN <=0.12 SENSITIVE Sensitive     NITROFURANTOIN <=16 SENSITIVE Sensitive     TOBRAMYCIN <=1 SENSITIVE Sensitive     TRIMETH/SULFA <=20 SENSITIVE Sensitive     PIP/TAZO <=4 SENSITIVE Sensitive     * KLEBSIELLA OXYTOCA  Urine culture     Status: None   Collection Time: 09/17/14 11:36 AM  Result Value Ref Range Status   Specimen Description URINE, CLEAN CATCH  Final   Special Requests NONE  Final   Culture  Setup Time   Final    09/17/2014 22:23 Performed at [facility] Performed at [facility]   Final   Culture NO GROWTH Performed at [facility]   Final   Report Status 09/18/2014 FINAL  Final  Blood culture (routine x 2)     Status: None (Preliminary result)   Collection Time: 09/17/14 11:36 AM  Result Value Ref Range Status   Specimen Description BLOOD  Final   Special Requests NONE  Final   Culture NO GROWTH 2 DAYS  Final   Report Status PENDING  Incomplete  Blood culture (routine x 2)     Status: None (Preliminary result)   Collection Time: 09/17/14 11:45 AM  Result Value Ref Range Status   Specimen Description BLOOD  Final   Special Requests NONE  Final   Culture NO GROWTH 2 DAYS  Final   Report Status PENDING  Incomplete     Studies: Dg  Lumbar Spine 2-3 Views  09/19/2014   CLINICAL DATA:  Mid back pain, no known injury  EXAM: LUMBAR SPINE - 2-3 VIEW  COMPARISON:  CT abdomen and pelvis 09/17/2014  FINDINGS: RIGHT ureteral stent noted.  Bones appear demineralized.  5 non-rib-bearing lumbar vertebrae.  Small amount retained contrast within colon obscuring portions of spine on [facility] view.  Vertebral body and disc space heights in the lumbar spine appear preserved.  Height loss identified at T11 vertebral body demonstrating approximately 30-40% anterior height loss.  This is unchanged since recent CT but new since an earlier CT  of 04/05/2013.  No subluxation or additional fracture identified.  No gross evidence of spondylolysis.  SI joints symmetric.  IMPRESSION: Osseous demineralization with superior endplate compression fracture of T11 new since 04/05/2013 as above.   Electronically Signed   By: Lavonia Dana M.D.   On: 09/19/2014 11:07    Scheduled Meds: . amLODipine  5 mg Oral Daily  . aspirin EC  81 mg Oral Daily  . carvedilol  25 mg Oral BID WC  . cloNIDine  0.2 mg Oral BID  . heparin  5,000 Units Subcutaneous 3 times per day  . insulin aspart  0-15 Units Subcutaneous TID WC  . insulin aspart  4 Units Subcutaneous TID WC  . insulin detemir  5 Units Subcutaneous QHS  . isosorbide dinitrate  20 mg Oral TID  . piperacillin-tazobactam (ZOSYN)  IV  3.375 g Intravenous 3 times per day  . predniSONE  5 mg Oral BID WC  . tamsulosin  0.4 mg Oral Daily   Continuous Infusions: . dextrose 5 % and 0.45 % NaCl with KCl 20 mEq/L 100 mL/hr at 09/19/14 2263     Time spent: > 35 minutes    Velvet Bathe  [facility] Pager [phone] If 7PM-7AM, please contact night-coverage at [email or web address], password [facility] 09/19/2014, 2:15 PM  LOS: 2 days

## 2014-09-19 NOTE — Plan of Care (Signed)
Problem: Phase III Progression Outcomes Goal: Activity at appropriate level-compared to baseline (UP IN CHAIR FOR HEMODIALYSIS)  Outcome: Completed/Met Date Met:  09/19/14

## 2014-09-19 NOTE — Plan of Care (Signed)
Problem: Phase II Progression Outcomes Goal: Obtain order to discontinue catheter if appropriate Outcome: Not Applicable Date Met:  25/63/89  Problem: Phase III Progression Outcomes Goal: Voiding independently Outcome: Completed/Met Date Met:  09/19/14 Goal: Foley discontinued Outcome: Not Applicable Date Met:  37/34/28

## 2014-09-20 LAB — URINE CULTURE
COLONY COUNT: NO GROWTH
CULTURE: NO GROWTH

## 2014-09-20 LAB — GLUCOSE, CAPILLARY
GLUCOSE-CAPILLARY: 192 mg/dL — AB (ref 70–99)
GLUCOSE-CAPILLARY: 119 mg/dL — AB (ref 70–99)
GLUCOSE-CAPILLARY: 142 mg/dL — AB (ref 70–99)
Glucose-Capillary: 127 mg/dL — ABNORMAL HIGH (ref 70–99)

## 2014-09-20 MED ORDER — OXYCODONE HCL 5 MG PO TABS: 5.0000 mg | ORAL_TABLET | Freq: Once | ORAL | Status: AC

## 2014-09-20 MED ORDER — ABIRATERONE ACETATE 250 MG PO TABS
1000.0000 mg | ORAL_TABLET | Freq: Every day | ORAL | Status: DC
  Administered 2014-09-20 – 2014-09-25 (×6): 1000 mg via ORAL

## 2014-09-20 NOTE — Progress Notes (Signed)
[facility] PROGRESS NOTE  Oscar Hickman JIR:[id number] DOB: 11/16/1940 DOA: 09/17/2014 PCP: [name], MD  Assessment/Plan: Principal Problem:   Pyelonephritis - urine culture growing Klebsiella - Repeat urine culture reporting no growth - Will continue oral Cipro - Contacted urology given history of stent and currently no plans for surgery while in house. Will have patient f/u with urologist as outpatient for further evaluation and recommendations.  Active Problems:   ADENOCARCINOMA, PROSTATE - patient to continue routine follow-up after discharge    DM type 2 causing complication - patient had hypoglycemic episode - continue levemir at 5 units and currently improved blood sugar values. - continue to monitor blood sugars.    HTN (hypertension) - currently not well controlled suspect pain is also contributing. - Patient currently on carvedilol, clonidine, amlodipine, Isordil - patient is on various blood pressure medications. Will avoid increasing blood pressure medications while patient is in pain. Once pain subsides and improves if blood pressures elevated consistently we'll plan on increasing antihypertensive regimen  Low back pain - urologist reporting concern that we should exclude metastatic prostate ca.  - lumbar x-ray reports compression fracture of T11 new since 04/05/2013 - PT consulted    Hydronephrosis of right kidney - Urology consulted    Chronic systolic CHF (congestive heart failure) - currently compensated we'll continue current regimen  Code Status: full Family Communication: No family at bedside Disposition Plan: Pending improvement in condition.   Consultants:  urology  Procedures:  none  Antibiotics:  Cipro  HPI/Subjective: No new complaints reported.  Objective: Filed Vitals:   09/20/14 1654  BP: 164/108  Pulse:   Temp:   Resp:     Intake/Output Summary (Last 24 hours) at 09/20/14 1743 Last data filed at  09/20/14 1700  Gross per 24 hour  Intake    760 ml  Output    675 ml  Net     85 ml   Filed Weights   09/18/14 0541 09/19/14 0457 09/20/14 0439  Weight: 82 kg (180 lb 12.4 oz) 83.6 kg (184 lb 4.9 oz) 86.5 kg (190 lb 11.2 oz)    Exam:   General:  Patient in no acute distress, alert and awake, patient looks uncomfortable  Cardiovascular: regular rate and rhythm, no murmurs or rubs  Respiratory: clear to auscultation bilaterally, no wheezes  Abdomen: soft, nondistended, no guarding  Musculoskeletal: no cyanosis or clubbing   Data Reviewed: Basic Metabolic Panel:  Recent Labs Lab 09/17/14 1131 09/17/14 1858 09/18/14 0446  NA 142  --  144  K 4.0  --  4.3  CL 102  --  107  CO2 28  --  30  GLUCOSE 234*  --  74  BUN 17  --  17  CREATININE 0.96 0.85 0.98  CALCIUM 9.2  --  9.2   Liver Function Tests:  Recent Labs Lab 09/17/14 1131  AST 13  ALT 11  ALKPHOS 99  BILITOT 0.6  PROT 6.7  ALBUMIN 3.0*   No results for input(s): LIPASE, AMYLASE in the last 168 hours. No results for input(s): AMMONIA in the last 168 hours. CBC:  Recent Labs Lab 09/17/14 1131 09/17/14 1858 09/18/14 0446  WBC 6.6 6.3 7.4  NEUTROABS 4.0  --   --   HGB 10.7* 10.7* 10.0*  HCT 33.9* 34.6* 32.7*  MCV 81.5 81.4 82.2  PLT 363 349 332   Cardiac Enzymes:  Recent Labs Lab 09/17/14 1131  TROPONINI <0.30   BNP (last 3 results)  Recent  Labs  11/27/13 0554 05/19/14 0845 07/02/14 1326  PROBNP 1323.0* 7039.0* 5754.0*   CBG:  Recent Labs Lab 09/19/14 1152 09/19/14 1646 09/19/14 2148 09/20/14 0734 09/20/14 1146  GLUCAP 138* 207* 171* 192* 119*    Recent Results (from the past 240 hour(s))  Urine culture     Status: None   Collection Time: 09/13/14  3:42 PM  Result Value Ref Range Status   Specimen Description URINE, CATHETERIZED  Final   Special Requests NONE  Final   Culture  Setup Time   Final    09/13/2014 22:31 Performed at [place]    Final    >=100,000 COLONIES/ML Performed at [place] OXYTOCA Performed at [facility]    Report Status 09/17/2014 FINAL  Final   Organism ID, Bacteria KLEBSIELLA OXYTOCA  Final      Susceptibility   Klebsiella oxytoca - MIC*    AMPICILLIN >=32 RESISTANT Resistant     CEFAZOLIN >=64 RESISTANT Resistant     CEFTRIAXONE <=1 SENSITIVE Sensitive     CIPROFLOXACIN <=0.25 SENSITIVE Sensitive     GENTAMICIN <=1 SENSITIVE Sensitive     LEVOFLOXACIN <=0.12 SENSITIVE Sensitive     NITROFURANTOIN <=16 SENSITIVE Sensitive     TOBRAMYCIN <=1 SENSITIVE Sensitive     TRIMETH/SULFA <=20 SENSITIVE Sensitive     PIP/TAZO <=4 SENSITIVE Sensitive     * KLEBSIELLA OXYTOCA  Urine culture     Status: None   Collection Time: 09/17/14 11:36 AM  Result Value Ref Range Status   Specimen Description URINE, CLEAN CATCH  Final   Special Requests NONE  Final   Culture  Setup Time   Final    09/17/2014 22:23 Performed at [place] Performed at [facility]   Final   Culture NO GROWTH Performed at [facility]   Final   Report Status 09/18/2014 FINAL  Final  Blood culture (routine x 2)     Status: None (Preliminary result)   Collection Time: 09/17/14 11:36 AM  Result Value Ref Range Status   Specimen Description BLOOD  Final   Special Requests NONE  Final   Culture NO GROWTH 2 DAYS  Final   Report Status PENDING  Incomplete  Blood culture (routine x 2)     Status: None (Preliminary result)   Collection Time: 09/17/14 11:45 AM  Result Value Ref Range Status   Specimen Description BLOOD  Final   Special Requests NONE  Final   Culture NO GROWTH 2 DAYS  Final   Report Status PENDING  Incomplete     Studies: Dg Lumbar Spine 2-3 Views  09/19/2014   CLINICAL DATA:  Mid back pain, no known injury  EXAM: LUMBAR SPINE - 2-3 VIEW  COMPARISON:  CT abdomen and pelvis 09/17/2014  FINDINGS: RIGHT  ureteral stent noted.  Bones appear demineralized.  5 non-rib-bearing lumbar vertebrae.  Small amount retained contrast within colon obscuring portions of spine on AP view.  Vertebral body and disc space heights in the lumbar spine appear preserved.  Height loss identified at T11 vertebral body demonstrating approximately 30-40% anterior height loss.  This is unchanged since recent CT but new since an earlier CT of 04/05/2013.  No subluxation or additional fracture identified.  No gross evidence of spondylolysis.  SI joints symmetric.  IMPRESSION: Osseous demineralization with superior endplate compression fracture of  T11 new since 04/05/2013 as above.   Electronically Signed   By: Lavonia Dana M.D.   On: 09/19/2014 11:07    Scheduled Meds: . abiraterone Acetate  1,000 mg Oral QAC breakfast  . amLODipine  5 mg Oral Daily  . aspirin EC  81 mg Oral Daily  . carvedilol  25 mg Oral BID WC  . ciprofloxacin  500 mg Oral BID  . cloNIDine  0.2 mg Oral BID  . heparin  5,000 Units Subcutaneous 3 times per day  . insulin aspart  0-15 Units Subcutaneous TID WC  . insulin aspart  4 Units Subcutaneous TID WC  . insulin detemir  5 Units Subcutaneous QHS  . isosorbide dinitrate  20 mg Oral TID  . predniSONE  5 mg Oral BID WC  . tamsulosin  0.4 mg Oral Daily   Continuous Infusions: . dextrose 5 % and 0.45 % NaCl with KCl 20 mEq/L 100 mL/hr at 09/20/14 1140     Time spent: > 35 minutes    Velvet Bathe  [facility] Pager [phone] If 7PM-7AM, please contact night-coverage at [email or web address], password [facility] 09/20/2014, 5:43 PM  LOS: 3 days

## 2014-09-20 NOTE — Evaluation (Signed)
Physical Therapy Evaluation Patient Details Name: Oscar Hickman MRN: [id number] DOB: 28-Jul-1941 Today's Date: 09/20/2014   History of Present Illness  73 yo male admitted with pyelonephritis, T11 comp fx. hx of prostate cancer, COPD, CHF, neuropathy, DM, HTN, medial non compliance.   Clinical Impression  On eval, pt was Min guard assist for mobility-able to ambulate ~75 feet while holding onto IV pole. Pt mobilized fairly well considering he rated pain 10/10 with activity. Anticipate mobility will only improve with better pain control. Recommend HHPT, RW at this time. Discussed d/c plan-pt states he has some assist available at home.     Follow Up Recommendations Home health PT;Supervision - Intermittent    Equipment Recommendations  Rolling walker with 5" wheels (if pt will use)    Recommendations for Other Services       Precautions / Restrictions Precautions Precautions: Fall Restrictions Weight Bearing Restrictions: No      Mobility  Bed Mobility Overal bed mobility: Modified Independent Bed Mobility: Supine to Sit     Supine to sit: HOB elevated;Modified independent (Device/Increase time)     General bed mobility comments: Increased time.  Transfers Overall transfer level: Needs assistance   Transfers: Sit to/from Stand Sit to Stand: From elevated surface;Min guard         General transfer comment: close guard for safety  Ambulation/Gait Ambulation/Gait assistance: Min guard Ambulation Distance (Feet): 75 Feet Assistive device:  (IV pole) Gait Pattern/deviations: Step-through pattern;Decreased stride length     General Gait Details: slightly unsteady but no LOB. Pt reports 10/10 pain with activity. Distance limited due to pain  Stairs            Wheelchair Mobility    Modified Rankin (Stroke Patients Only)       Balance Overall balance assessment: Needs assistance         Standing balance support: No upper extremity  supported;During functional activity Standing balance-Leahy Scale: Fair                               Pertinent Vitals/Pain Pain Assessment: 0-10 Pain Score: 10-Worst pain ever Pain Location: back Pain Intervention(s): Patient requesting pain meds-RN notified;Monitored during session    [place] expects to be discharged to:: Private residence Living Arrangements: Spouse/significant other;Other relatives (grandchildren)   Type of Home: House Home Access: Stairs to enter   [employer] of Steps: a couple   Home Equipment: Radio producer - single point      Prior Function Level of Independence: Independent               Hand Dominance        Extremity/Trunk Assessment   Upper Extremity Assessment: Generalized weakness           Lower Extremity Assessment: Generalized weakness      Cervical / Trunk Assessment: Normal  Communication   Communication: No difficulties  Cognition Arousal/Alertness: Awake/alert Behavior During Therapy: WFL for tasks assessed/performed Overall Cognitive Status: Within Functional Limits for tasks assessed                      General Comments      Exercises        Assessment/Plan    PT Assessment Patient needs continued PT services  PT Diagnosis Acute pain;Difficulty walking   PT Problem List Decreased strength;Decreased activity tolerance;Decreased balance;Pain;Decreased mobility  PT Treatment Interventions DME instruction;Gait training;Functional mobility training;Therapeutic activities;Patient/family  education;Balance training   PT Goals (Current goals can be found in the Care Plan section) Acute Rehab PT Goals Patient Stated Goal: less pain PT Goal Formulation: With patient Time For Goal Achievement: 10/04/14 Potential to Achieve Goals: Good    Frequency Min 3X/week   Barriers to discharge        Co-evaluation               End of Session   Activity Tolerance:  Patient limited by pain;Patient limited by fatigue Patient left: in bed;with call bell/phone within reach;with nursing/sitter in room           Time: 1431-1457 PT Time Calculation (min) (ACUTE ONLY): 26 min   Charges:   PT Evaluation $Initial PT Evaluation Tier I: 1 Procedure PT Treatments $Gait Training: 8-22 mins $Therapeutic Activity: 8-22 mins   PT G Codes:          Weston Anna, MPT Pager: [phone]

## 2014-09-21 ENCOUNTER — Inpatient Hospital Stay (HOSPITAL_COMMUNITY): Payer: Medicare Other

## 2014-09-21 LAB — GLUCOSE, CAPILLARY
GLUCOSE-CAPILLARY: 129 mg/dL — AB (ref 70–99)
Glucose-Capillary: 199 mg/dL — ABNORMAL HIGH (ref 70–99)
Glucose-Capillary: 147 mg/dL — ABNORMAL HIGH (ref 70–99)
Glucose-Capillary: 145 mg/dL — ABNORMAL HIGH (ref 70–99)

## 2014-09-21 MED ORDER — FUROSEMIDE 10 MG/ML IJ SOLN
40.0000 mg | Freq: Two times a day (BID) | INTRAMUSCULAR | Status: DC
  Administered 2014-09-21 – 2014-09-23 (×5): 40 mg via INTRAVENOUS
  Filled 2014-09-21 (×6): qty 4

## 2014-09-21 NOTE — Progress Notes (Signed)
[facility] PROGRESS NOTE  COLTEN DESROCHES IRS:[id number] DOB: 10-31-1941 DOA: 09/17/2014 PCP: [name], MD  Assessment/Plan: Principal Problem:   Pyelonephritis - urine culture growing Klebsiella - Repeat urine culture reporting no growth - Will continue oral Cipro 5/7 - Contacted urology given history of stent and currently no plans for surgery while in house. Will have patient f/u with urologist as outpatient for further evaluation and recommendations.  Active Problems:   ADENOCARCINOMA, PROSTATE - patient to continue routine follow-up after discharge    DM type 2 causing complication - patient had hypoglycemic episode while in house which resolved with levemir adjustment below. - continue levemir at 5 units and currently improved blood sugar values. - continue to monitor blood sugars.    HTN (hypertension) - improved control - Patient currently on carvedilol, clonidine, amlodipine, Isordil  Low back pain - urologist reporting concern that we should exclude metastatic prostate ca.  - lumbar x-ray reports compression fracture of T11 new since 04/05/2013 - PT consulted    Hydronephrosis of right kidney - Urology consulted    Chronic systolic CHF (congestive heart failure): new exacerbated problem 09/21/14 - Pt may be fluid overloaded given increase in wob.  Will place on lasix and reassess.  Code Status: full Family Communication: No family at bedside Disposition Plan: Pending improvement in condition.   Consultants:  urology  Procedures:  none  Antibiotics:  Cipro  HPI/Subjective: No new complaints reported. Still somewhat short of breath.  Objective: Filed Vitals:   09/21/14 0601  BP: 154/98  Pulse: 77  Temp: 97.4 F (36.3 C)  Resp: 18    Intake/Output Summary (Last 24 hours) at 09/21/14 1431 Last data filed at 09/21/14 0600  Gross per 24 hour  Intake   2660 ml  Output   1475 ml  Net   1185 ml   Filed Weights   09/19/14  0457 09/20/14 0439 09/21/14 0500  Weight: 83.6 kg (184 lb 4.9 oz) 86.5 kg (190 lb 11.2 oz) 89.6 kg (197 lb 8.5 oz)    Exam:   General:  Patient in no acute distress, alert and awake, patient looks uncomfortable  Cardiovascular: regular rate and rhythm, no murmurs or rubs  Respiratory: clear to auscultation bilaterally, no wheezes  Abdomen: soft, nondistended, no guarding  Musculoskeletal: no cyanosis or clubbing   Data Reviewed: Basic Metabolic Panel:  Recent Labs Lab 09/17/14 1131 09/17/14 1858 09/18/14 0446  NA 142  --  144  K 4.0  --  4.3  CL 102  --  107  CO2 28  --  30  GLUCOSE 234*  --  74  BUN 17  --  17  CREATININE 0.96 0.85 0.98  CALCIUM 9.2  --  9.2   Liver Function Tests:  Recent Labs Lab 09/17/14 1131  AST 13  ALT 11  ALKPHOS 99  BILITOT 0.6  PROT 6.7  ALBUMIN 3.0*   No results for input(s): LIPASE, AMYLASE in the last 168 hours. No results for input(s): AMMONIA in the last 168 hours. CBC:  Recent Labs Lab 09/17/14 1131 09/17/14 1858 09/18/14 0446  WBC 6.6 6.3 7.4  NEUTROABS 4.0  --   --   HGB 10.7* 10.7* 10.0*  HCT 33.9* 34.6* 32.7*  MCV 81.5 81.4 82.2  PLT 363 349 332   Cardiac Enzymes:  Recent Labs Lab 09/17/14 1131  TROPONINI <0.30   BNP (last 3 results)  Recent Labs  11/27/13 0554 05/19/14 0845 07/02/14 1326  PROBNP 1323.0* 7039.0* 5754.0*  CBG:  Recent Labs Lab 09/20/14 1146 09/20/14 1649 09/20/14 2131 09/21/14 0741 09/21/14 1136  GLUCAP 119* 142* 127* 199* 147*    Recent Results (from the past 240 hour(s))  Urine culture     Status: None   Collection Time: 09/13/14  3:42 PM  Result Value Ref Range Status   Specimen Description URINE, CATHETERIZED  Final   Special Requests NONE  Final   Culture  Setup Time   Final    09/13/2014 22:31 Performed at [place]   Final    >=100,000 COLONIES/ML Performed at [place]  OXYTOCA Performed at [facility]    Report Status 09/17/2014 FINAL  Final   Organism ID, Bacteria KLEBSIELLA OXYTOCA  Final      Susceptibility   Klebsiella oxytoca - MIC*    AMPICILLIN >=32 RESISTANT Resistant     CEFAZOLIN >=64 RESISTANT Resistant     CEFTRIAXONE <=1 SENSITIVE Sensitive     CIPROFLOXACIN <=0.25 SENSITIVE Sensitive     GENTAMICIN <=1 SENSITIVE Sensitive     LEVOFLOXACIN <=0.12 SENSITIVE Sensitive     NITROFURANTOIN <=16 SENSITIVE Sensitive     TOBRAMYCIN <=1 SENSITIVE Sensitive     TRIMETH/SULFA <=20 SENSITIVE Sensitive     PIP/TAZO <=4 SENSITIVE Sensitive     * KLEBSIELLA OXYTOCA  Urine culture     Status: None   Collection Time: 09/17/14 11:36 AM  Result Value Ref Range Status   Specimen Description URINE, CLEAN CATCH  Final   Special Requests NONE  Final   Culture  Setup Time   Final    09/17/2014 22:23 Performed at [place] Performed at [facility]   Final   Culture NO GROWTH Performed at [facility]   Final   Report Status 09/18/2014 FINAL  Final  Blood culture (routine x 2)     Status: None (Preliminary result)   Collection Time: 09/17/14 11:36 AM  Result Value Ref Range Status   Specimen Description BLOOD RIGHT ARM  Final   Special Requests BOTTLES DRAWN AEROBIC AND ANAEROBIC 8CC  Final   Culture NO GROWTH 4 DAYS  Final   Report Status PENDING  Incomplete  Blood culture (routine x 2)     Status: None (Preliminary result)   Collection Time: 09/17/14 11:45 AM  Result Value Ref Range Status   Specimen Description BLOOD RIGHT ARM  Final   Special Requests BOTTLES DRAWN AEROBIC AND ANAEROBIC 8CC  Final   Culture NO GROWTH 4 DAYS  Final   Report Status PENDING  Incomplete  Urine culture     Status: None   Collection Time: 09/19/14  8:32 PM  Result Value Ref Range Status   Specimen Description URINE, CLEAN CATCH  Final   Special Requests NONE  Final   Culture  Setup Time   Final     09/20/2014 02:00 Performed at [place] Performed at [facility]   Final   Culture NO GROWTH Performed at [facility]   Final   Report Status 09/20/2014 FINAL  Final     Studies: Dg Chest 2 View  09/21/2014   CLINICAL DATA:  Shortness breath.  EXAM: CHEST  2 VIEW  COMPARISON:  Two-view chest x-ray 09/17/2014.  FINDINGS: The heart is enlarged. Pulmonary vascular congestion is improved. Pulmonary  vascular hypertension is again noted. A small right pleural effusion is stable. Degenerative changes and exaggerated kyphosis of the thoracic spine are again noted.  IMPRESSION: 1. Cardiomegaly with improving pulmonary vascular congestion. 2. Atherosclerosis. 3. Pulmonary arterial hypertension. 4. Small right pleural effusion.   Electronically Signed   By: Lawrence Santiago M.D.   On: 09/21/2014 09:09    Scheduled Meds: . abiraterone Acetate  1,000 mg Oral QAC breakfast  . amLODipine  5 mg Oral Daily  . aspirin EC  81 mg Oral Daily  . carvedilol  25 mg Oral BID WC  . ciprofloxacin  500 mg Oral BID  . cloNIDine  0.2 mg Oral BID  . furosemide  40 mg Intravenous BID  . heparin  5,000 Units Subcutaneous 3 times per day  . insulin aspart  0-15 Units Subcutaneous TID WC  . insulin aspart  4 Units Subcutaneous TID WC  . insulin detemir  5 Units Subcutaneous QHS  . isosorbide dinitrate  20 mg Oral TID  . predniSONE  5 mg Oral BID WC  . tamsulosin  0.4 mg Oral Daily   Continuous Infusions: . dextrose 5 % and 0.45 % NaCl with KCl 20 mEq/L 100 mL/hr at 09/21/14 1121     Time spent: > 35 minutes    Velvet Bathe  [facility] Pager [phone] If 7PM-7AM, please contact night-coverage at [email or web address], password [facility] 09/21/2014, 2:31 PM  LOS: 4 days

## 2014-09-22 LAB — GLUCOSE, CAPILLARY
GLUCOSE-CAPILLARY: 169 mg/dL — AB (ref 70–99)
GLUCOSE-CAPILLARY: 101 mg/dL — AB (ref 70–99)
GLUCOSE-CAPILLARY: 214 mg/dL — AB (ref 70–99)
Glucose-Capillary: 143 mg/dL — ABNORMAL HIGH (ref 70–99)

## 2014-09-22 LAB — BASIC METABOLIC PANEL
ANION GAP: 7 (ref 5–15)
BUN: 17 mg/dL (ref 6–23)
CO2: 30 mEq/L (ref 19–32)
Calcium: 9.1 mg/dL (ref 8.4–10.5)
Chloride: 102 mEq/L (ref 96–112)
Creatinine, Ser: 0.78 mg/dL (ref 0.50–1.35)
GFR calc Af Amer: >90 mL/min (ref >90)
GFR, EST NON AFRICAN AMERICAN: 88 mL/min — AB (ref >90)
Glucose, Bld: 163 mg/dL — ABNORMAL HIGH (ref 70–99)
POTASSIUM: 4.8 meq/L (ref 3.7–5.3)
SODIUM: 139 meq/L (ref 137–147)

## 2014-09-22 LAB — CULTURE, BLOOD (ROUTINE X 2)
Culture: NO GROWTH
Culture: NO GROWTH

## 2014-09-22 MED ORDER — OXYCODONE HCL 5 MG PO TABS
5.0000 mg | ORAL_TABLET | ORAL | Status: DC | PRN
  Administered 2014-09-24 – 2014-09-25 (×2): 5 mg via ORAL
  Filled 2014-09-22 (×2): qty 1

## 2014-09-22 MED ORDER — OXYCODONE HCL ER 15 MG PO T12A
15.0000 mg | EXTENDED_RELEASE_TABLET | Freq: Two times a day (BID) | ORAL | Status: DC
  Administered 2014-09-22 – 2014-09-25 (×7): 15 mg via ORAL
  Filled 2014-09-22 (×7): qty 1

## 2014-09-22 NOTE — Progress Notes (Signed)
[facility] PROGRESS NOTE  YASSER HEPP IRS:[id number] DOB: 1940-12-05 DOA: 09/17/2014 PCP: [name], MD  Assessment/Plan: Principal Problem:   Pyelonephritis - urine culture growing Klebsiella - Repeat urine culture reporting no growth - Will continue oral Cipro 6/7 - Contacted urology given history of stent and currently no plans for surgery while in house. Will have patient f/u with urologist as outpatient for further evaluation and recommendations.  Active Problems:   ADENOCARCINOMA, PROSTATE - patient to continue routine follow-up after discharge    DM type 2 causing complication - patient had hypoglycemic episode while in house which resolved with levemir adjustment below. - continue levemir at 5 units and currently improved blood sugar values. - continue to monitor blood sugars.    HTN (hypertension) - improved control - Patient currently on carvedilol, clonidine, amlodipine, Isordil  Low back pain - urologist reporting concern that we should exclude metastatic prostate ca.  - lumbar x-ray reports compression fracture of T11 new since 04/05/2013 - PT consulted    Hydronephrosis of right kidney - Urology consulted    Chronic systolic CHF (congestive heart failure): new exacerbated problem 09/21/14 - Pt may be fluid overloaded given increase in wob.   - Will repeat lasix dose today. Wean off of supplemental oxygen  Code Status: full Family Communication: No family at bedside Disposition Plan: Pending improvement in condition.   Consultants:  urology  Procedures:  none  Antibiotics:  Cipro  HPI/Subjective: No new complaints reported. Still short of breath at times.  Objective: Filed Vitals:   09/22/14 1431  BP: 130/76  Pulse: 66  Temp: 98.1 F (36.7 C)  Resp: 18    Intake/Output Summary (Last 24 hours) at 09/22/14 1438 Last data filed at 09/22/14 1435  Gross per 24 hour  Intake   2760 ml  Output  10325 ml  Net  -7565 ml    Filed Weights   09/20/14 0439 09/21/14 0500 09/22/14 0850  Weight: 86.5 kg (190 lb 11.2 oz) 89.6 kg (197 lb 8.5 oz) 90.5 kg (199 lb 8.3 oz)    Exam:   General:  Patient in no acute distress, alert and awake, patient looks uncomfortable  Cardiovascular: regular rate and rhythm, no murmurs or rubs  Respiratory: clear to auscultation bilaterally, no wheezes, decreased breath sounds at bases  Abdomen: soft, nondistended, no guarding  Musculoskeletal: no cyanosis or clubbing   Data Reviewed: Basic Metabolic Panel:  Recent Labs Lab 09/17/14 1131 09/17/14 1858 09/18/14 0446 09/22/14 0500  NA 142  --  144 139  K 4.0  --  4.3 4.8  CL 102  --  107 102  CO2 28  --  30 30  GLUCOSE 234*  --  74 163*  BUN 17  --  17 17  CREATININE 0.96 0.85 0.98 0.78  CALCIUM 9.2  --  9.2 9.1   Liver Function Tests:  Recent Labs Lab 09/17/14 1131  AST 13  ALT 11  ALKPHOS 99  BILITOT 0.6  PROT 6.7  ALBUMIN 3.0*   No results for input(s): LIPASE, AMYLASE in the last 168 hours. No results for input(s): AMMONIA in the last 168 hours. CBC:  Recent Labs Lab 09/17/14 1131 09/17/14 1858 09/18/14 0446  WBC 6.6 6.3 7.4  NEUTROABS 4.0  --   --   HGB 10.7* 10.7* 10.0*  HCT 33.9* 34.6* 32.7*  MCV 81.5 81.4 82.2  PLT 363 349 332   Cardiac Enzymes:  Recent Labs Lab 09/17/14 1131  TROPONINI <0.30  BNP (last 3 results)  Recent Labs  11/27/13 0554 05/19/14 0845 07/02/14 1326  PROBNP 1323.0* 7039.0* 5754.0*   CBG:  Recent Labs Lab 09/21/14 1136 09/21/14 1634 09/21/14 2234 09/22/14 0730 09/22/14 1153  GLUCAP 147* 129* 145* 169* 143*    Recent Results (from the past 240 hour(s))  Urine culture     Status: None   Collection Time: 09/13/14  3:42 PM  Result Value Ref Range Status   Specimen Description URINE, CATHETERIZED  Final   Special Requests NONE  Final   Culture  Setup Time   Final    09/13/2014 22:31 Performed at [place]   Final     >=100,000 COLONIES/ML Performed at [place] OXYTOCA Performed at [facility]    Report Status 09/17/2014 FINAL  Final   Organism ID, Bacteria KLEBSIELLA OXYTOCA  Final      Susceptibility   Klebsiella oxytoca - MIC*    AMPICILLIN >=32 RESISTANT Resistant     CEFAZOLIN >=64 RESISTANT Resistant     CEFTRIAXONE <=1 SENSITIVE Sensitive     CIPROFLOXACIN <=0.25 SENSITIVE Sensitive     GENTAMICIN <=1 SENSITIVE Sensitive     LEVOFLOXACIN <=0.12 SENSITIVE Sensitive     NITROFURANTOIN <=16 SENSITIVE Sensitive     TOBRAMYCIN <=1 SENSITIVE Sensitive     TRIMETH/SULFA <=20 SENSITIVE Sensitive     PIP/TAZO <=4 SENSITIVE Sensitive     * KLEBSIELLA OXYTOCA  Urine culture     Status: None   Collection Time: 09/17/14 11:36 AM  Result Value Ref Range Status   Specimen Description URINE, CLEAN CATCH  Final   Special Requests NONE  Final   Culture  Setup Time   Final    09/17/2014 22:23 Performed at [place] Performed at [facility]   Final   Culture NO GROWTH Performed at [facility]   Final   Report Status 09/18/2014 FINAL  Final  Blood culture (routine x 2)     Status: None   Collection Time: 09/17/14 11:36 AM  Result Value Ref Range Status   Specimen Description BLOOD RIGHT ARM  Final   Special Requests BOTTLES DRAWN AEROBIC AND ANAEROBIC 8CC  Final   Culture NO GROWTH 5 DAYS  Final   Report Status 09/22/2014 FINAL  Final  Blood culture (routine x 2)     Status: None   Collection Time: 09/17/14 11:45 AM  Result Value Ref Range Status   Specimen Description BLOOD RIGHT ARM  Final   Special Requests BOTTLES DRAWN AEROBIC AND ANAEROBIC 8CC  Final   Culture NO GROWTH 5 DAYS  Final   Report Status 09/22/2014 FINAL  Final  Urine culture     Status: None   Collection Time: 09/19/14  8:32 PM  Result Value Ref Range Status   Specimen Description URINE, CLEAN CATCH  Final    Special Requests NONE  Final   Culture  Setup Time   Final    09/20/2014 02:00 Performed at [place] Performed at [facility]   Final   Culture NO GROWTH Performed at [facility]   Final   Report Status 09/20/2014 FINAL  Final     Studies: Dg Chest 2 View  09/21/2014   CLINICAL DATA:  Shortness breath.  EXAM: CHEST  2 VIEW  COMPARISON:  Two-view chest x-ray 09/17/2014.  FINDINGS: The heart is enlarged. Pulmonary vascular congestion is improved. Pulmonary vascular hypertension is again noted. A small right pleural effusion is stable. Degenerative changes and exaggerated kyphosis of the thoracic spine are again noted.  IMPRESSION: 1. Cardiomegaly with improving pulmonary vascular congestion. 2. Atherosclerosis. 3. Pulmonary arterial hypertension. 4. Small right pleural effusion.   Electronically Signed   By: Lawrence Santiago M.D.   On: 09/21/2014 09:09    Scheduled Meds: . abiraterone Acetate  1,000 mg Oral QAC breakfast  . amLODipine  5 mg Oral Daily  . aspirin EC  81 mg Oral Daily  . carvedilol  25 mg Oral BID WC  . ciprofloxacin  500 mg Oral BID  . cloNIDine  0.2 mg Oral BID  . furosemide  40 mg Intravenous BID  . heparin  5,000 Units Subcutaneous 3 times per day  . insulin aspart  0-15 Units Subcutaneous TID WC  . insulin aspart  4 Units Subcutaneous TID WC  . insulin detemir  5 Units Subcutaneous QHS  . isosorbide dinitrate  20 mg Oral TID  . predniSONE  5 mg Oral BID WC  . tamsulosin  0.4 mg Oral Daily   Continuous Infusions: . dextrose 5 % and 0.45 % NaCl with KCl 20 mEq/L 100 mL/hr at 09/21/14 2128     Time spent: > 35 minutes    Velvet Bathe  [facility] Pager [phone] If 7PM-7AM, please contact night-coverage at [email or web address], password [facility] 09/22/2014, 2:38 PM  LOS: 5 days

## 2014-09-22 NOTE — Plan of Care (Signed)
Problem: Phase I Progression Outcomes Goal: Initial discharge plan identified Outcome: Completed/Met Date Met:  09/22/14 Goal: Hemodynamically stable Outcome: Completed/Met Date Met:  09/22/14 Goal: Other Phase I Outcomes/Goals Outcome: Completed/Met Date Met:  09/22/14  Problem: Phase II Progression Outcomes Goal: Discharge plan established Outcome: Completed/Met Date Met:  09/22/14 Goal: IV changed to normal saline lock Outcome: Completed/Met Date Met:  09/22/14

## 2014-09-23 LAB — GLUCOSE, CAPILLARY
Glucose-Capillary: 156 mg/dL — ABNORMAL HIGH (ref 70–99)
Glucose-Capillary: 125 mg/dL — ABNORMAL HIGH (ref 70–99)
Glucose-Capillary: 176 mg/dL — ABNORMAL HIGH (ref 70–99)
Glucose-Capillary: 131 mg/dL — ABNORMAL HIGH (ref 70–99)

## 2014-09-23 NOTE — Progress Notes (Signed)
CARE MANAGEMENT NOTE 09/23/2014  Patient:  MARICELA, KAWAHARA   Account Number:  [id number]  Date Initiated:  09/18/2014  Documentation initiated by:  Karl Bales  Subjective/Objective Assessment:   pt admitted with Pyelonephritis     Action/Plan:   from home   Anticipated DC Date:  09/21/2014   Anticipated DC Plan:  HOME/SELF CARE         Choice offered to / List presented to:             Status of service:  In process, will continue to follow Medicare Important Message given?  YES (If response is "NO", the following Medicare IM given date fields will be blank) Date Medicare IM given:  09/23/2014 Medicare IM given by:  Karl Bales Date Additional Medicare IM given:   Additional Medicare IM given by:    Discharge Disposition:    Per UR Regulation:  Reviewed for med. necessity/level of care/duration of stay  If discussed at [place] of Stay Meetings, dates discussed:    Comments:  09/23/14 MMcGibboney, RN, BSN Spoke with pt concerning [place], pt referred me to his wife. A call to pt's home [phone], spoke with Mrs. Fite concerning [place]. She selected [place]. Referral called to in house rep.  09/18/14 MMcGibboney, RN, BSN Chart reviewed.

## 2014-09-23 NOTE — Progress Notes (Signed)
[facility] PROGRESS NOTE  JERRY CLYNE ZGY:[id number] DOB: 08-24-1941 DOA: 09/17/2014 PCP: [name], MD  Assessment/Plan: Principal Problem:   Pyelonephritis - urine culture growing Klebsiella - Repeat urine culture reporting no growth - Will continue oral Cipro 7/7 - Contacted urology given history of stent and currently no plans for surgery while in house. Will have patient f/u with urologist as outpatient for further evaluation and recommendations.  Active Problems:   ADENOCARCINOMA, PROSTATE - patient to continue routine follow-up after discharge    DM type 2 causing complication - patient had hypoglycemic episode while in house which resolved with levemir adjustment below. - continue levemir at 5 units and currently improved blood sugar values. - continue to monitor blood sugars.    HTN (hypertension) - improved control - Patient currently on carvedilol, clonidine, amlodipine, Isordil  Low back pain - urologist reporting concern that we should exclude metastatic prostate ca.  - lumbar x-ray reports compression fracture of T11 new since 04/05/2013 - PT consulted    Hydronephrosis of right kidney - Urology consulted    Chronic systolic CHF (congestive heart failure): new exacerbated problem 09/21/14 - patient currently compensated. Will discontinue Lasix Obtain BMP next a.m.  Code Status: full Family Communication: No family at bedside Disposition Plan: Pending improvement in condition.   Consultants:  urology  Procedures:  none  Antibiotics:  Cipro  HPI/Subjective: No new complaints reported. States his breathing is fine  Objective: Filed Vitals:   09/23/14 1355  BP: 162/94  Pulse:   Temp:   Resp:     Intake/Output Summary (Last 24 hours) at 09/23/14 1806 Last data filed at 09/23/14 1353  Gross per 24 hour  Intake    360 ml  Output   5700 ml  Net  -5340 ml   Filed Weights   09/21/14 0500 09/22/14 0850 09/23/14 0500   Weight: 89.6 kg (197 lb 8.5 oz) 90.5 kg (199 lb 8.3 oz) 85.6 kg (188 lb 11.4 oz)    Exam:   General:  Patient in no acute distress, alert and awake, patient looks uncomfortable  Cardiovascular: regular rate and rhythm, no murmurs or rubs  Respiratory: clear to auscultation bilaterally, no wheezes,no increased work of breathing on room air  Abdomen: soft, nondistended, no guarding  Musculoskeletal: no cyanosis or clubbing   Data Reviewed: Basic Metabolic Panel:  Recent Labs Lab 09/17/14 1131 09/17/14 1858 09/18/14 0446 09/22/14 0500  NA 142  --  144 139  K 4.0  --  4.3 4.8  CL 102  --  107 102  CO2 28  --  30 30  GLUCOSE 234*  --  74 163*  BUN 17  --  17 17  CREATININE 0.96 0.85 0.98 0.78  CALCIUM 9.2  --  9.2 9.1   Liver Function Tests:  Recent Labs Lab 09/17/14 1131  AST 13  ALT 11  ALKPHOS 99  BILITOT 0.6  PROT 6.7  ALBUMIN 3.0*   No results for input(s): LIPASE, AMYLASE in the last 168 hours. No results for input(s): AMMONIA in the last 168 hours. CBC:  Recent Labs Lab 09/17/14 1131 09/17/14 1858 09/18/14 0446  WBC 6.6 6.3 7.4  NEUTROABS 4.0  --   --   HGB 10.7* 10.7* 10.0*  HCT 33.9* 34.6* 32.7*  MCV 81.5 81.4 82.2  PLT 363 349 332   Cardiac Enzymes:  Recent Labs Lab 09/17/14 1131  TROPONINI <0.30   BNP (last 3 results)  Recent Labs  11/27/13 0554 05/19/14 0845  07/02/14 1326  PROBNP 1323.0* 7039.0* 5754.0*   CBG:  Recent Labs Lab 09/22/14 1647 09/22/14 2224 09/23/14 0748 09/23/14 1141 09/23/14 1700  GLUCAP 101* 214* 156* 125* 176*    Recent Results (from the past 240 hour(s))  Urine culture     Status: None   Collection Time: 09/17/14 11:36 AM  Result Value Ref Range Status   Specimen Description URINE, CLEAN CATCH  Final   Special Requests NONE  Final   Culture  Setup Time   Final    09/17/2014 22:23 Performed at [place] Performed at [facility]   Final    Culture NO GROWTH Performed at [facility]   Final   Report Status 09/18/2014 FINAL  Final  Blood culture (routine x 2)     Status: None   Collection Time: 09/17/14 11:36 AM  Result Value Ref Range Status   Specimen Description BLOOD RIGHT ARM  Final   Special Requests BOTTLES DRAWN AEROBIC AND ANAEROBIC 8CC  Final   Culture NO GROWTH 5 DAYS  Final   Report Status 09/22/2014 FINAL  Final  Blood culture (routine x 2)     Status: None   Collection Time: 09/17/14 11:45 AM  Result Value Ref Range Status   Specimen Description BLOOD RIGHT ARM  Final   Special Requests BOTTLES DRAWN AEROBIC AND ANAEROBIC 8CC  Final   Culture NO GROWTH 5 DAYS  Final   Report Status 09/22/2014 FINAL  Final  Urine culture     Status: None   Collection Time: 09/19/14  8:32 PM  Result Value Ref Range Status   Specimen Description URINE, CLEAN CATCH  Final   Special Requests NONE  Final   Culture  Setup Time   Final    09/20/2014 02:00 Performed at [place] Performed at [facility]   Final   Culture NO GROWTH Performed at [facility]   Final   Report Status 09/20/2014 FINAL  Final     Studies: No results found.  Scheduled Meds: . abiraterone Acetate  1,000 mg Oral QAC breakfast  . amLODipine  5 mg Oral Daily  . aspirin EC  81 mg Oral Daily  . carvedilol  25 mg Oral BID WC  . ciprofloxacin  500 mg Oral BID  . cloNIDine  0.2 mg Oral BID  . furosemide  40 mg Intravenous BID  . heparin  5,000 Units Subcutaneous 3 times per day  . insulin aspart  0-15 Units Subcutaneous TID WC  . insulin aspart  4 Units Subcutaneous TID WC  . insulin detemir  5 Units Subcutaneous QHS  . isosorbide dinitrate  20 mg Oral TID  . OxyCODONE  15 mg Oral Q12H  . predniSONE  5 mg Oral BID WC  . tamsulosin  0.4 mg Oral Daily   Continuous Infusions: . dextrose 5 % and 0.45 % NaCl with KCl 20 mEq/L 100 mL/hr at 09/21/14 2128     Time spent: > 35  minutes    Velvet Bathe  [facility] Pager [phone] If 7PM-7AM, please contact night-coverage at [email or web address], password [facility] 09/23/2014, 6:06 PM  LOS: 6 days

## 2014-09-23 NOTE — Progress Notes (Signed)
Physical Therapy Treatment Patient Details Name: Oscar Hickman MRN: [id number] DOB: 12-18-1940 Today's Date: 09/23/2014    History of Present Illness 73 yo male admitted with pyelonephritis, T11 comp fx. hx of prostate cancer, COPD, CHF, neuropathy, DM, HTN, medial non compliance.     PT Comments    Pt was able to improve gait tolerance to 500 feet on RA in the hallway this tx.  Pt reports pain as 10/10 back pain throughout tx; objective findings do not match subjective reports.    Follow Up Recommendations  Home health PT;Supervision - Intermittent     Equipment Recommendations  Rolling walker with 5" wheels    Recommendations for Other Services       Precautions / Restrictions Precautions Precautions: Fall Restrictions Weight Bearing Restrictions: No    Mobility  Bed Mobility Overal bed mobility: Needs Assistance Bed Mobility: Supine to Sit     Supine to sit: Min assist;HOB elevated     General bed mobility comments: HOB fully elevated and manual handheld A required; increased time  Transfers Overall transfer level: Needs assistance Equipment used: Rolling walker (2 wheeled) Transfers: Sit to/from Stand Sit to Stand: Min assist         General transfer comment: VCs for hand placement; increased time  Ambulation/Gait Ambulation/Gait assistance: Min guard Ambulation Distance (Feet): 500 Feet Assistive device: Rolling walker (2 wheeled) Gait Pattern/deviations: Step-through pattern;Trunk flexed Gait velocity: WNL   General Gait Details: VCs for upright posture; pt on RA, O2 sats ranged between 95-97%   Stairs            Wheelchair Mobility    Modified Rankin (Stroke Patients Only)       Balance                                    Cognition Arousal/Alertness: Awake/alert Behavior During Therapy: WFL for tasks assessed/performed Overall Cognitive Status: Within Functional Limits for tasks assessed                       Exercises General Exercises - Lower Extremity Ankle Circles/Pumps: AROM;Both;20 reps;Seated Heel Slides: AROM;Both;10 reps;Seated Straight Leg Raises: AROM;Both;10 reps;Seated    General Comments        Pertinent Vitals/Pain Pain Assessment: 0-10 Pain Score: 10-Worst pain ever Pain Location: back Pain Intervention(s): Monitored during session;Repositioned    Home Living                      Prior Function            PT Goals (current goals can now be found in the care plan section) Progress towards PT goals: Progressing toward goals    Frequency  Min 3X/week    PT Plan Current plan remains appropriate    Co-evaluation             End of Session Equipment Utilized During Treatment: Gait belt Activity Tolerance: Patient tolerated treatment well Patient left: in chair;with call bell/phone within reach;with chair alarm set     Time: 1445-1512 PT Time Calculation (min) (ACUTE ONLY): 27 min  Charges:                       G Codes:      Miller,Derrick, SPTA 09/23/2014, 3:55 PM   Reviewed above  Rica Koyanagi  PTA [facility]  Acute  Rehab Pager  [phone]    

## 2014-09-23 NOTE — Plan of Care (Signed)
Problem: Discharge Progression Outcomes Goal: Pain controlled with appropriate interventions Outcome: Completed/Met Date Met:  09/23/14 Goal: Hemodynamically stable Outcome: Completed/Met Date Met:  09/23/14 Goal: Tolerating diet Outcome: Completed/Met Date Met:  09/23/14

## 2014-09-23 NOTE — Plan of Care (Signed)
Problem: Phase II Progression Outcomes Goal: Progress activity as tolerated unless otherwise ordered Outcome: Progressing     

## 2014-09-24 LAB — GLUCOSE, CAPILLARY
GLUCOSE-CAPILLARY: 86 mg/dL (ref 70–99)
GLUCOSE-CAPILLARY: 127 mg/dL — AB (ref 70–99)
Glucose-Capillary: 141 mg/dL — ABNORMAL HIGH (ref 70–99)
Glucose-Capillary: 128 mg/dL — ABNORMAL HIGH (ref 70–99)

## 2014-09-24 LAB — BASIC METABOLIC PANEL
ANION GAP: 9 (ref 5–15)
BUN: 17 mg/dL (ref 6–23)
CHLORIDE: 95 meq/L — AB (ref 96–112)
CO2: 38 meq/L — AB (ref 19–32)
CREATININE: 0.84 mg/dL (ref 0.50–1.35)
Calcium: 9.6 mg/dL (ref 8.4–10.5)
GFR calc Af Amer: >90 mL/min (ref >90)
GFR calc non Af Amer: 85 mL/min — ABNORMAL LOW (ref >90)
Glucose, Bld: 118 mg/dL — ABNORMAL HIGH (ref 70–99)
Potassium: 3.8 mEq/L (ref 3.7–5.3)
Sodium: 142 mEq/L (ref 137–147)

## 2014-09-24 NOTE — Progress Notes (Signed)
[facility] PROGRESS NOTE  Oscar Hickman FUX:[id number] DOB: [birth year] DOA: 09/17/2014 PCP: [name], MD  Brief Narrative: Patient is a 73 y/o with history of metastatic adenocarcinoma of prostate followed by Dr. Michela Pitcher. He has history of right ureteral obstruction that has been managed with right ureteral stent drainage. From the EMR his last stent change was in May 2014. He was originally diagnosed with Gleason 9 prostate cancer and was treated with radiation therapy but developed metastatic disease. 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 He presented with Pyelonephritis and new T11 compression fx.  Assessment/Plan: Principal Problem:   Pyelonephritis - urine culture growing Klebsiella - Repeat urine culture reporting no growth - Will continue oral Cipro as patient reporting some discomfort. Will consider a 10 day treatment course given history. - Contacted urology given history of stent and currently no plans for surgery while in house. Will have patient f/u with urologist as outpatient for further evaluation and recommendations.  Active Problems:   ADENOCARCINOMA, PROSTATE - patient to continue routine follow-up after discharge    DM type 2 causing complication - patient had hypoglycemic episode while in house which resolved with levemir adjustment below. - continue levemir at 5 units and currently improved blood sugar values. - continue to monitor blood sugars.    HTN (hypertension) - improved control - Patient currently on carvedilol, clonidine, amlodipine, Isordil  Low back pain - urologist reporting concern that we should exclude metastatic prostate ca.  - lumbar x-ray reports compression fracture of T11 new since 04/05/2013 - PT consulted and recommending home health with PT    Hydronephrosis of right kidney - Urology consulted    Chronic  systolic CHF (congestive heart failure): new exacerbated problem 09/21/14 - patient currently compensated. Will discontinue Lasix Obtain BMP next a.m.  Code Status: full Family Communication: No family at bedside Disposition Plan: D/c within the next 1-2 days with improvement in condition.   Consultants:  urology  Procedures:  none  Antibiotics:  Cipro  HPI/Subjective: States he doesn't feel so good. States that he is not sure why.  Objective: Filed Vitals:   09/24/14 1005  BP: 143/80  Pulse: 77  Temp:   Resp: 20    Intake/Output Summary (Last 24 hours) at 09/24/14 1635 Last data filed at 09/24/14 0517  Gross per 24 hour  Intake    360 ml  Output   2800 ml  Net  -2440 ml   Filed Weights   09/22/14 0850 09/23/14 0500 09/24/14 0518  Weight: 90.5 kg (199 lb 8.3 oz) 85.6 kg (188 lb 11.4 oz) 82.7 kg (182 lb 5.1 oz)    Exam:   General:  Patient in no acute distress, alert and awake,  Cardiovascular: regular rate and rhythm, no murmurs or rubs  Respiratory: clear to auscultation bilaterally, no wheezes,no increased work of breathing on room air  Abdomen: soft, nondistended, no guarding  Musculoskeletal: no cyanosis or clubbing   Data Reviewed: Basic Metabolic Panel:  Recent Labs Lab 09/17/14 1858 09/18/14 0446 09/22/14 0500 09/24/14 0500  NA  --  144 139 142  K  --  4.3 4.8 3.8  CL  --  107 102 95*  CO2  --  30 30 38*  GLUCOSE  --  74 163* 118*  BUN  --  17 17 17   CREATININE 0.85 0.98 0.78 0.84  CALCIUM  --  9.2 9.1 9.6  Liver Function Tests: No results for input(s): AST, ALT, ALKPHOS, BILITOT, PROT, ALBUMIN in the last 168 hours. No results for input(s): LIPASE, AMYLASE in the last 168 hours. No results for input(s): AMMONIA in the last 168 hours. CBC:  Recent Labs Lab 09/17/14 1858 09/18/14 0446  WBC 6.3 7.4  HGB 10.7* 10.0*  HCT 34.6* 32.7*  MCV 81.4 82.2  PLT 349 332   Cardiac Enzymes: No results for input(s): CKTOTAL, CKMB,  CKMBINDEX, TROPONINI in the last 168 hours. BNP (last 3 results)  Recent Labs  11/27/13 0554 05/19/14 0845 07/02/14 1326  PROBNP 1323.0* 7039.0* 5754.0*   CBG:  Recent Labs Lab 09/23/14 1141 09/23/14 1700 09/23/14 2114 09/24/14 0737 09/24/14 1159  GLUCAP 125* 176* 131* 141* 128*    Recent Results (from the past 240 hour(s))  Urine culture     Status: None   Collection Time: 09/17/14 11:36 AM  Result Value Ref Range Status   Specimen Description URINE, CLEAN CATCH  Final   Special Requests NONE  Final   Culture  Setup Time   Final    09/17/2014 22:23 Performed at [place] Performed at [facility]   Final   Culture NO GROWTH Performed at [facility]   Final   Report Status 09/18/2014 FINAL  Final  Blood culture (routine x 2)     Status: None   Collection Time: 09/17/14 11:36 AM  Result Value Ref Range Status   Specimen Description BLOOD RIGHT ARM  Final   Special Requests BOTTLES DRAWN AEROBIC AND ANAEROBIC 8CC  Final   Culture NO GROWTH 5 DAYS  Final   Report Status 09/22/2014 FINAL  Final  Blood culture (routine x 2)     Status: None   Collection Time: 09/17/14 11:45 AM  Result Value Ref Range Status   Specimen Description BLOOD RIGHT ARM  Final   Special Requests BOTTLES DRAWN AEROBIC AND ANAEROBIC 8CC  Final   Culture NO GROWTH 5 DAYS  Final   Report Status 09/22/2014 FINAL  Final  Urine culture     Status: None   Collection Time: 09/19/14  8:32 PM  Result Value Ref Range Status   Specimen Description URINE, CLEAN CATCH  Final   Special Requests NONE  Final   Culture  Setup Time   Final    09/20/2014 02:00 Performed at [place] Performed at [facility]   Final   Culture NO GROWTH Performed at [facility]   Final   Report Status 09/20/2014 FINAL  Final     Studies: No results found.  Scheduled Meds: . abiraterone Acetate  1,000 mg  Oral QAC breakfast  . amLODipine  5 mg Oral Daily  . aspirin EC  81 mg Oral Daily  . carvedilol  25 mg Oral BID WC  . ciprofloxacin  500 mg Oral BID  . cloNIDine  0.2 mg Oral BID  . heparin  5,000 Units Subcutaneous 3 times per day  . insulin aspart  0-15 Units Subcutaneous TID WC  . insulin aspart  4 Units Subcutaneous TID WC  . insulin detemir  5 Units Subcutaneous QHS  . isosorbide dinitrate  20 mg Oral TID  . OxyCODONE  15 mg Oral Q12H  . predniSONE  5 mg Oral BID WC  . tamsulosin  0.4 mg Oral Daily   Continuous Infusions: . dextrose 5 % and 0.45 %  NaCl with KCl 20 mEq/L 100 mL/hr at 09/21/14 2128     Time spent: > 35 minutes    Velvet Bathe  [facility] Pager [phone] If 7PM-7AM, please contact night-coverage at [email or web address], password [facility] 09/24/2014, 4:35 PM  LOS: 7 days

## 2014-09-25 DIAGNOSIS — C61 Malignant neoplasm of prostate: Secondary | ICD-10-CM

## 2014-09-25 DIAGNOSIS — N390 Urinary tract infection, site not specified: Secondary | ICD-10-CM | POA: Insufficient documentation

## 2014-09-25 DIAGNOSIS — M549 Dorsalgia, unspecified: Secondary | ICD-10-CM

## 2014-09-25 DIAGNOSIS — Z960 Presence of urogenital implants: Secondary | ICD-10-CM | POA: Insufficient documentation

## 2014-09-25 LAB — GLUCOSE, CAPILLARY
GLUCOSE-CAPILLARY: 125 mg/dL — AB (ref 70–99)
Glucose-Capillary: 162 mg/dL — ABNORMAL HIGH (ref 70–99)
Glucose-Capillary: 129 mg/dL — ABNORMAL HIGH (ref 70–99)

## 2014-09-25 MED ORDER — CIPROFLOXACIN HCL 500 MG PO TABS: 500.0000 mg | ORAL_TABLET | Freq: Two times a day (BID) | ORAL | Status: DC

## 2014-09-25 MED ORDER — SENNOSIDES-DOCUSATE SODIUM 8.6-50 MG PO TABS: 1.0000 | ORAL_TABLET | Freq: Every evening | ORAL | Status: AC | PRN

## 2014-09-25 NOTE — Progress Notes (Signed)
Physical Therapy Treatment Patient Details Name: Oscar Hickman MRN: [id number] DOB: 12-11-1940 Today's Date: 09/25/2014    History of Present Illness 73 yo male admitted with pyelonephritis, T11 comp fx. hx of prostate cancer, COPD, CHF, neuropathy, DM, HTN, medial non compliance.     PT Comments    Pt is supervision-min A with all mobility this tx.  Pt was able to initiate stair training this tx.  Pt requires mod-max VCs for sequencing on stairs.    Follow Up Recommendations  Home health PT;Supervision - Intermittent     Equipment Recommendations  Rolling walker with 5" wheels    Recommendations for Other Services       Precautions / Restrictions Precautions Precautions: Fall Restrictions Weight Bearing Restrictions: No    Mobility  Bed Mobility Overal bed mobility: Needs Assistance Bed Mobility: Supine to Sit     Supine to sit: Supervision;HOB elevated     General bed mobility comments: HOB fully elevated; increased time  Transfers Overall transfer level: Needs assistance Equipment used: Rolling walker (2 wheeled) Transfers: Sit to/from Stand Sit to Stand: Min guard         General transfer comment: max VCs for hand placement; increased time; pt insists on no A for sit to stand  Ambulation/Gait Ambulation/Gait assistance: Min guard Ambulation Distance (Feet): 500 Feet Assistive device: Rolling walker (2 wheeled) Gait Pattern/deviations: Step-through pattern;Trunk flexed Gait velocity: WNL   General Gait Details: VCs for sequencing and RW management; pt is unsafe with turning technique and continues to perform incorrectly despite repeated cueing.   Stairs Stairs: Yes Stairs assistance: Min assist Stair Management: One rail Left;Step to pattern;Forwards Number of Stairs: 4 General stair comments: pt required mod VCs for sequencing and technique; pt was unsteady on stairs; increased time  Wheelchair Mobility    Modified Rankin (Stroke Patients  Only)       Balance                                    Cognition Arousal/Alertness: Awake/alert Behavior During Therapy: WFL for tasks assessed/performed Overall Cognitive Status: Within Functional Limits for tasks assessed                      Exercises      General Comments        Pertinent Vitals/Pain      Home Living                      Prior Function            PT Goals (current goals can now be found in the care plan section) Progress towards PT goals: Progressing toward goals    Frequency  Min 3X/week    PT Plan Current plan remains appropriate    Co-evaluation             End of Session Equipment Utilized During Treatment: Gait belt Activity Tolerance: Patient tolerated treatment well Patient left: in chair;with call bell/phone within reach;with chair alarm set     Time: 1032-1110 PT Time Calculation (min) (ACUTE ONLY): 38 min  Charges:                       G Codes:      Miller,Derrick, SPTA 09/25/2014, 1:05 PM   Reviewed above  Rica Koyanagi  PTA [facility]  Acute  Rehab Pager  [phone]    

## 2014-09-25 NOTE — Discharge Summary (Signed)
Physician Discharge Summary  Oscar Hickman OIN:[id number] DOB: Dec 01, 1940 DOA: 09/17/2014  PCP: [name], MD  Admit date: 09/17/2014 Discharge date: 09/25/2014  Time spent: 45 minutes  Recommendations for Outpatient Follow-up:  Patient will be discharged home with home health PT and rolling walker. He is to follow-up with his primary care physician within one week of discharge. Patient to continue taking his medications as prescribed. He should continue carb modified/ heart healthy diet.  Discharge Diagnoses:  Principal Problem:   Pyelonephritis Active Problems:   ADENOCARCINOMA, PROSTATE   DM type 2 causing complication   HTN (hypertension)   Hydronephrosis of right kidney   Chronic systolic CHF (congestive heart failure)   Back pain   Hydronephrosis, right   Retained ureteral stent   UTI (lower urinary tract infection)  Discharge Condition: Stable  Diet recommendation: heart healthy/car modified  Filed Weights   09/23/14 0500 09/24/14 0518 09/25/14 0532  Weight: 85.6 kg (188 lb 11.4 oz) 82.7 kg (182 lb 5.1 oz) 82.5 kg (181 lb 14.1 oz)    History of present illness:  on 09/17/2014 73 year old male with past medical history of prostate cancer, chronic systolic CHF, hypertension, diabetes who presented to the emergency department with a weeklong history of severe, constant right-sided mid to lower back pain. He was in the emergency department at [facility] 4 days prior to admission and was diagnosed with urinary tract infection and discharged on Bactrim. Urine culture showed greater than 100,000 gram-negative rods with sensitivities pending. In the emergency department patient was found to have dirty urine, CT scan showed moderate right hydronephrosis and perinephric stranding. Patient has had right ureteral stent in place for what seems like over a year. Emergency department physician consulted with urologist, Dr. Tresa Moore, who recommended transfer to [facility] for possible percutaneous nephrostomy tube and stent removal in addition to continue treatment of pyelonephritis with IV antibiotics.  Hospital Course:  Acute Pyelonephritis -Urine culture: Klebsiella from 11/6 (prior to admission) -repeat urine culture: no growth -Continue Cipro (10 days total)  Right ureteral obstruction  -urology consulted and appreciated and felt patient to be overdue for stent change, however, not an acute issue requiring intervention. -Dr. Alinda Money recommended followup with patient's urologist or [facility] upon discharge for cystoscopy and right ureteral stent change  Acute systolic CHF exacerbation -Resolved at this time. Patient currently euvolemic -patient was given IV Lasix 40 mg twice daily on 1114 through 09/23/2014 -Echocardiogram 05/19/2014 shows an EF of 20-25% -patient has had good urine output, approximately 10,000 mL over the past 48 hours  Castrate resistant metastatic prostate cancer -Patient will need follow-up with his oncologist, Dr. Michela Pitcher, at [facility] upon discharge.  Back pain/T11 compression fracture -possibly secondary to metastatic prostate cancer, lumbar x-ray shows compression fracture of T11 since 04/05/2013 -PT consulted and recommended home health physical therapy  Diabetes mellitus, type II -patient to continue home medications upon discharge  Hypertension -stable, continue amlodipine, Coreg, clonidine, Lasix, Isordil  Procedures: None  Consultations: Urology  Discharge Exam: Filed Vitals:   09/25/14 0532  BP: 149/89  Pulse: 73  Temp: 98 F (36.7 C)  Resp: 16     General: Well developed, well nourished, NAD, appears stated age  HEENT: NCAT, mucous membranes moist.  Cardiovascular: S1 S2 auscultated, no rubs, murmurs or gallops. Regular rate and rhythm.  Respiratory: Clear to auscultation bilaterally with equal chest rise  Abdomen: Soft, nontender, nondistended, + bowel  sounds  Extremities: warm dry without cyanosis clubbing  or edema  Neuro: AAOx3, no focal deficits  Psych: appropriate mood and affect  Discharge Instructions      Discharge Instructions    Discharge instructions    Complete by:  As directed   Patient will be discharged home with home health PT and rolling walker. He is to follow-up with his primary care physician within one week of discharge. Patient to continue taking his medications as prescribed. He should continue carb modified/ heart healthy diet.            Medication List    STOP taking these medications        sulfamethoxazole-trimethoprim 800-160 MG per tablet  Commonly known as:  BACTRIM DS,SEPTRA DS      TAKE these medications        abiraterone Acetate 250 MG tablet  Commonly known as:  ZYTIGA  Take 4 tablets (1,000 mg total) by mouth daily. Take on an empty stomach 1 hour before or 2 hours after a meal     acetaminophen 650 MG CR tablet  Commonly known as:  TYLENOL  Take 650 mg by mouth every 8 (eight) hours as needed for pain. Takes 2 for headache occasionally     albuterol (2.5 MG/3ML) 0.083% nebulizer solution  Commonly known as:  PROVENTIL  Take 3 mLs (2.5 mg total) by nebulization every 2 (two) hours as needed for wheezing or shortness of breath.     amLODipine 5 MG tablet  Commonly known as:  NORVASC  Take 1 tablet (5 mg total) by mouth daily.     aspirin EC 81 MG tablet  Take 81 mg by mouth daily.     carvedilol 25 MG tablet  Commonly known as:  COREG  Take 25 mg by mouth 2 (two) times daily with a meal.     ciprofloxacin 500 MG tablet  Commonly known as:  CIPRO  Take 1 tablet (500 mg total) by mouth 2 (two) times daily.     cloNIDine 0.2 MG tablet  Commonly known as:  CATAPRES  Take 1 tablet (0.2 mg total) by mouth 2 (two) times daily.     ENSURE PLUS Liqd  Take 237 mLs by mouth daily.     furosemide 20 MG tablet  Commonly known as:  LASIX  Take 20 mg by mouth daily.      furosemide 20 MG tablet  Commonly known as:  LASIX  Take 1 tablet (20 mg total) by mouth 2 (two) times daily.     ibuprofen 200 MG tablet  Commonly known as:  ADVIL,MOTRIN  Take 400 mg by mouth every 6 (six) hours as needed for moderate pain.     insulin glargine 100 UNIT/ML injection  Commonly known as:  LANTUS  Inject 25-26 Units into the skin daily.     isosorbide dinitrate 20 MG tablet  Commonly known as:  ISORDIL  Take 20 mg by mouth 3 (three) times daily.     metFORMIN 500 MG tablet  Commonly known as:  GLUCOPHAGE  Take 500 mg by mouth 2 (two) times daily with a meal.     potassium chloride SA 20 MEQ tablet  Commonly known as:  K-DUR,KLOR-CON  Take 20 mEq by mouth daily.     predniSONE 5 MG tablet  Commonly known as:  DELTASONE  Take 1 tablet (5 mg total) by mouth 2 (two) times daily with a meal.     senna-docusate 8.6-50 MG per tablet  Commonly known as:  Senokot-S  Take  1 tablet by mouth at bedtime as needed for mild constipation.     tamsulosin 0.4 MG Caps capsule  Commonly known as:  FLOMAX  Take 1 capsule (0.4 mg total) by mouth daily.       No Known Allergies Follow-up Information    Follow up with [name], MD. Schedule an appointment as soon as possible for a visit in 1 week.   Specialty:  Internal Medicine   Why:  hospital follow-up   Contact information:   [place] [id number] [phone]       Follow up with Marissa Nestle, MD.   Specialty:  Urology   Why:  Hospital followup   Contact information:   [address] [address] [phone]        The results of significant diagnostics from this hospitalization (including imaging, microbiology, ancillary and laboratory) are listed below for reference.    Significant Diagnostic Studies: Dg Chest 2 View  09/21/2014   CLINICAL DATA:  Shortness breath.  EXAM: CHEST  2 VIEW  COMPARISON:  Two-view chest x-ray 09/17/2014.  FINDINGS: The heart  is enlarged. Pulmonary vascular congestion is improved. Pulmonary vascular hypertension is again noted. A small right pleural effusion is stable. Degenerative changes and exaggerated kyphosis of the thoracic spine are again noted.  IMPRESSION: 1. Cardiomegaly with improving pulmonary vascular congestion. 2. Atherosclerosis. 3. Pulmonary arterial hypertension. 4. Small right pleural effusion.   Electronically Signed   By: Lawrence Santiago M.D.   On: 09/21/2014 09:09   Dg Chest 2 View  09/17/2014   CLINICAL DATA:  Cough.  Short of breath and back pain.  Vomiting.  EXAM: CHEST  2 VIEW  COMPARISON:  07/02/2014  FINDINGS: Mild enlargement of the cardiopericardial silhouette. Aorta is uncoiled.  Pulmonary arteries and veins centrally all prominent as they were previously. No lung consolidation or overt edema. No pleural effusion or pneumothorax.  The bony thorax is demineralized but grossly intact.  IMPRESSION: 1. No change from prior study. Significant prominence of the pulmonary vasculature centrally common overt pulmonary edema. No evidence of pneumonia. Stable cardiomegaly.   Electronically Signed   By: Lajean Manes M.D.   On: 09/17/2014 14:08   Dg Lumbar Spine 2-3 Views  09/19/2014   CLINICAL DATA:  Mid back pain, no known injury  EXAM: LUMBAR SPINE - 2-3 VIEW  COMPARISON:  CT abdomen and pelvis 09/17/2014  FINDINGS: RIGHT ureteral stent noted.  Bones appear demineralized.  5 non-rib-bearing lumbar vertebrae.  Small amount retained contrast within colon obscuring portions of spine on AP view.  Vertebral body and disc space heights in the lumbar spine appear preserved.  Height loss identified at T11 vertebral body demonstrating approximately 30-40% anterior height loss.  This is unchanged since recent CT but new since an earlier CT of 04/05/2013.  No subluxation or additional fracture identified.  No gross evidence of spondylolysis.  SI joints symmetric.  IMPRESSION: Osseous demineralization with superior  endplate compression fracture of T11 new since 04/05/2013 as above.   Electronically Signed   By: Lavonia Dana M.D.   On: 09/19/2014 11:07   Ct Abdomen Pelvis W Contrast  09/17/2014   CLINICAL DATA:  Bilateral lower back pain, nausea, vomiting  EXAM: CT ABDOMEN AND PELVIS WITH CONTRAST  TECHNIQUE: Multidetector CT imaging of the abdomen and pelvis was performed using the standard protocol following bolus administration of intravenous contrast.  CONTRAST:  74mL OMNIPAQUE IOHEXOL 300 MG/ML SOLN, 154mL OMNIPAQUE IOHEXOL 300 MG/ML SOLN  COMPARISON:  04/05/2013, 04/20/2012  FINDINGS: There are bilateral trace pleural effusions. There is mild bilateral interstitial thickening. There is cardiomegaly. There is coronary artery atherosclerosis.  There is a 10 mm hypodensity in the posterior right hepatic low which is too small to characterize. There is no intrahepatic or extrahepatic biliary ductal dilatation. The gallbladder is normal. The spleen demonstrates no focal abnormality. There is a right nephro ureteral stent with moderate right hydronephrosis. The proximal portion of the stent is within the proximal right ureter and terminating in the bladder. There is mild right perinephric and proximal periureteral stranding. The left kidney is unremarkable without obstructive uropathy. The adrenal glands and pancreas are normal. The bladder is unremarkable. Prior TURP.  The stomach, duodenum, small intestine, and large intestine demonstrate no bowel dilatation or wall thickening. There is a Forensic psychologist type umbilical hernia. There is no pneumoperitoneum, pneumatosis, or portal venous gas. There is no abdominal or pelvic free fluid. There is no lymphadenopathy.  The abdominal aorta is normal in caliber with atherosclerosis.  There are no lytic or sclerotic osseous lesions. There is a T11 vertebral body compression fracture of indeterminate age. There are mild broad-based disc bulges at L3-4, L4-5 and L5-S1.  IMPRESSION: 1.  Age-indeterminate T11 vertebral body compression fracture. 2. Right ureteral stent is present. Moderate right hydronephrosis and perinephric stranding. Overall appearance is unchanged from the prior exams. 3. Cardiomegaly with bilateral pleural effusions and mild interstitial thickening concerning for mild CHF.   Electronically Signed   By: Kathreen Devoid   On: 09/17/2014 14:17   Dg Abd Acute W/chest  09/13/2014   CLINICAL DATA:  Abdominal distention. Patient also complaining of bilateral flank pain radiating into the abdomen. History of prostate carcinoma. Pain and distension present for an unspecified length of time.  EXAM: ACUTE ABDOMEN SERIES (ABDOMEN 2 VIEW & CHEST 1 VIEW)  COMPARISON:  07/02/2014.  FINDINGS: Cardiac silhouette remains mildly enlarged and the aorta uncoiled. There are prominent hilar vessels without change. No pulmonary edema or lung consolidation. No pleural effusion or pneumothorax.  Mild generalized increased bowel gas, but no significant bowel distention. There is no evidence of obstruction, generalized adynamic ileus or free air.  Right ureteral stent appears grossly well-positioned.  There are aortoiliac vascular calcifications. Soft tissues otherwise unremarkable.  Bony structures are demineralized.  IMPRESSION: 1. No acute findings. No obstruction or generalized adynamic ileus. No free air. 2. Persistent prominent hilar vessels without change from the prior chest radiograph. No pulmonary edema or pneumonia.   Electronically Signed   By: Lajean Manes M.D.   On: 09/13/2014 14:28    Microbiology: Recent Results (from the past 240 hour(s))  Urine culture     Status: None   Collection Time: 09/17/14 11:36 AM  Result Value Ref Range Status   Specimen Description URINE, CLEAN CATCH  Final   Special Requests NONE  Final   Culture  Setup Time   Final    09/17/2014 22:23 Performed at [place] Performed at [facility]   Final    Culture NO GROWTH Performed at [facility]   Final   Report Status 09/18/2014 FINAL  Final  Blood culture (routine x 2)     Status: None   Collection Time: 09/17/14 11:36 AM  Result Value Ref Range Status   Specimen Description BLOOD RIGHT ARM  Final   Special Requests BOTTLES DRAWN AEROBIC AND ANAEROBIC 8CC  Final   Culture NO GROWTH  5 DAYS  Final   Report Status 09/22/2014 FINAL  Final  Blood culture (routine x 2)     Status: None   Collection Time: 09/17/14 11:45 AM  Result Value Ref Range Status   Specimen Description BLOOD RIGHT ARM  Final   Special Requests BOTTLES DRAWN AEROBIC AND ANAEROBIC 8CC  Final   Culture NO GROWTH 5 DAYS  Final   Report Status 09/22/2014 FINAL  Final  Urine culture     Status: None   Collection Time: 09/19/14  8:32 PM  Result Value Ref Range Status   Specimen Description URINE, CLEAN CATCH  Final   Special Requests NONE  Final   Culture  Setup Time   Final    09/20/2014 02:00 Performed at [place] Performed at [facility]   Final   Culture NO GROWTH Performed at [facility]   Final   Report Status 09/20/2014 FINAL  Final     Labs: Basic Metabolic Panel:  Recent Labs Lab 09/22/14 0500 09/24/14 0500  NA 139 142  K 4.8 3.8  CL 102 95*  CO2 30 38*  GLUCOSE 163* 118*  BUN 17 17  CREATININE 0.78 0.84  CALCIUM 9.1 9.6   Liver Function Tests: No results for input(s): AST, ALT, ALKPHOS, BILITOT, PROT, ALBUMIN in the last 168 hours. No results for input(s): LIPASE, AMYLASE in the last 168 hours. No results for input(s): AMMONIA in the last 168 hours. CBC: No results for input(s): WBC, NEUTROABS, HGB, HCT, MCV, PLT in the last 168 hours. Cardiac Enzymes: No results for input(s): CKTOTAL, CKMB, CKMBINDEX, TROPONINI in the last 168 hours. BNP: BNP (last 3 results)  Recent Labs  11/27/13 0554 05/19/14 0845 07/02/14 1326  PROBNP 1323.0* 7039.0* 5754.0*    CBG:  Recent Labs Lab 09/24/14 1159 09/24/14 1719 09/24/14 2059 09/25/14 0736 09/25/14 1151  GLUCAP 128* 86 127* 125* 162*       Signed:  Cedrica Brune  [facility] 09/25/2014, 1:39 PM

## 2014-09-25 NOTE — Discharge Instructions (Signed)

## 2014-09-25 NOTE — Progress Notes (Signed)
[facility]  [facility] is providing the following services: RW  If patient discharges after hours, please call [phone].   Linward Headland 09/25/2014, 4:09 PM

## 2014-10-17 ENCOUNTER — Encounter (HOSPITAL_COMMUNITY): Payer: Medicare Other

## 2014-10-17 ENCOUNTER — Telehealth (HOSPITAL_COMMUNITY): Payer: Self-pay | Admitting: Oncology

## 2014-10-17 ENCOUNTER — Encounter (HOSPITAL_COMMUNITY): Payer: Self-pay | Admitting: Oncology

## 2014-10-17 ENCOUNTER — Encounter (HOSPITAL_COMMUNITY): Payer: Self-pay | Admitting: Lab

## 2014-10-17 ENCOUNTER — Other Ambulatory Visit (HOSPITAL_COMMUNITY): Payer: Medicare Other

## 2014-10-17 ENCOUNTER — Encounter (HOSPITAL_COMMUNITY): Payer: Medicare Other | Attending: Oncology | Admitting: Oncology

## 2014-10-17 VITALS — BP 143/86 | HR 78 | Temp 97.6°F | Resp 22 | Wt 183.2 lb

## 2014-10-17 DIAGNOSIS — IMO0001 Reserved for inherently not codable concepts without codable children: Secondary | ICD-10-CM

## 2014-10-17 DIAGNOSIS — C61 Malignant neoplasm of prostate: Secondary | ICD-10-CM

## 2014-10-17 DIAGNOSIS — G893 Neoplasm related pain (acute) (chronic): Secondary | ICD-10-CM

## 2014-10-17 DIAGNOSIS — M4850XS Collapsed vertebra, not elsewhere classified, site unspecified, sequela of fracture: Secondary | ICD-10-CM | POA: Insufficient documentation

## 2014-10-17 DIAGNOSIS — Z5111 Encounter for antineoplastic chemotherapy: Secondary | ICD-10-CM

## 2014-10-17 LAB — CBC WITH DIFFERENTIAL/PLATELET
BASOS ABS: 0 10*3/uL (ref 0.0–0.1)
BASOS PCT: 0 % (ref 0–1)
Eosinophils Absolute: 0 10*3/uL (ref 0.0–0.7)
Eosinophils Relative: 1 % (ref 0–5)
HCT: 31 % — ABNORMAL LOW (ref 39.0–52.0)
Hemoglobin: 9.9 g/dL — ABNORMAL LOW (ref 13.0–17.0)
Lymphocytes Relative: 15 % (ref 12–46)
Lymphs Abs: 0.7 10*3/uL (ref 0.7–4.0)
MCH: 26 pg (ref 26.0–34.0)
MCHC: 31.9 g/dL (ref 30.0–36.0)
MCV: 81.4 fL (ref 78.0–100.0)
Monocytes Absolute: 0.6 10*3/uL (ref 0.1–1.0)
Monocytes Relative: 12 % (ref 3–12)
NEUTROS PCT: 72 % (ref 43–77)
Neutro Abs: 3.6 10*3/uL (ref 1.7–7.7)
PLATELETS: 246 10*3/uL (ref 150–400)
RBC: 3.81 MIL/uL — AB (ref 4.22–5.81)
RDW: 18.2 % — AB (ref 11.5–15.5)
WBC: 5 10*3/uL (ref 4.0–10.5)

## 2014-10-17 LAB — COMPREHENSIVE METABOLIC PANEL
ALBUMIN: 2.8 g/dL — AB (ref 3.5–5.2)
ALK PHOS: 121 U/L — AB (ref 39–117)
ALT: 13 U/L (ref 0–53)
AST: 14 U/L (ref 0–37)
Anion gap: 11 (ref 5–15)
BUN: 20 mg/dL (ref 6–23)
CO2: 28 mEq/L (ref 19–32)
Calcium: 9.1 mg/dL (ref 8.4–10.5)
Chloride: 105 mEq/L (ref 96–112)
Creatinine, Ser: 0.87 mg/dL (ref 0.50–1.35)
GFR calc Af Amer: >90 mL/min (ref >90)
GFR calc non Af Amer: 84 mL/min — ABNORMAL LOW (ref >90)
Glucose, Bld: 183 mg/dL — ABNORMAL HIGH (ref 70–99)
POTASSIUM: 3.9 meq/L (ref 3.7–5.3)
Sodium: 144 mEq/L (ref 137–147)
Total Bilirubin: 0.6 mg/dL (ref 0.3–1.2)
Total Protein: 6.1 g/dL (ref 6.0–8.3)

## 2014-10-17 MED ORDER — PREDNISONE 5 MG PO TABS: 5.0000 mg | ORAL_TABLET | Freq: Two times a day (BID) | ORAL | Status: AC

## 2014-10-17 MED ORDER — LEUPROLIDE ACETATE (3 MONTH) 22.5 MG IM KIT
22.5000 mg | PACK | Freq: Once | INTRAMUSCULAR | Status: AC
  Administered 2014-10-17: 22.5 mg via INTRAMUSCULAR
  Filled 2014-10-17: qty 22.5

## 2014-10-17 MED ORDER — ABIRATERONE ACETATE 250 MG PO TABS: 1000.0000 mg | ORAL_TABLET | Freq: Every day | ORAL | Status: DC

## 2014-10-17 MED ORDER — OXYCODONE-ACETAMINOPHEN 5-325 MG PO TABS: 1.0000 | ORAL_TABLET | ORAL | Status: DC | PRN

## 2014-10-17 NOTE — Progress Notes (Signed)
Oscar Hickman presents today for injection per MD orders. lupron 22.5 mg  administered IM ztrack in left Gluteal. Administration without incident. Patient tolerated well.  Oscar Hickman presented for labwork. Labs per MD order drawn via Peripheral Line 23 gauge needle inserted in left AC   Good blood return present. Procedure without incident.  Needle removed intact. Patient tolerated procedure well.

## 2014-10-17 NOTE — Progress Notes (Signed)
Referral sent to [facility] on 12/10.  Records faxed also

## 2014-10-17 NOTE — Telephone Encounter (Signed)
Error

## 2014-10-17 NOTE — Progress Notes (Signed)
[name], MD [place] [address]  ADENOCARCINOMA, PROSTATE - Plan: abiraterone Acetate (ZYTIGA) 250 MG tablet, NM Bone Scan Whole Body, predniSONE (DELTASONE) 5 MG tablet, oxyCODONE-acetaminophen (PERCOCET/ROXICET) 5-325 MG per tablet, CBC with Differential, Comprehensive metabolic panel, Testosterone, PSA  Vertebral compression fracture, sequela - Plan: oxyCODONE-acetaminophen (PERCOCET/ROXICET) 5-325 MG per tablet  CURRENT THERAPY: Depo Lupron 22.5 mg every 3 months intramuscularly, Zytiga/prednisone daily started on 07/06/2012.   INTERVAL HISTORY: Oscar Hickman 73 y.o. male returns for  regular  visit for followup of Zytiga 1000 mg daily with prednisone 5 mg twice a day for metastatic castrate resistant prostate cancer, status post TURP therapy in 2009 followed by external beam radiotherapy.  I personally reviewed and went over laboratory results with the patient.  The results are noted within this dictation.  Chart is reviewed.  I personally reviewed and went over radiographic studies with the patient.  The results are noted within this dictation.    Patient's main complaint is back pain.  He is seen using a walker today for ambulation.  He reports that the pain is excruciating and worsens with movement.  His recent CT abd/pelvis demonstrated a T11 compression fracture.  He localizes the pain to the upper lumbar area at midline.  He does not have any neurologic deficits and denies any changes in bowel and bladder control.  He has good strength and sensation in bilateral LE.  He reports that he saw his primary care provider recently, but no pain medication was provided to him. The pain has been persistent x 3-4 weeks.  He denies any recent fall or trauma.   Due to transportation issues at this time, I will treat symptomatically and perform a bone scan to rule out metastatic bony disease.  Depending on results, neurosurgery consult may be needed.  He  also needs urologic follow-up since his primary urologist retired.  We will therefore refer him to [facility] for establishment of care.  Oncologically, he denies any complaints and ROS questioning is negative.  He admits to compliance with his chemotherapy regimen and is tolerating well.  Past Medical History  Diagnosis Date  . Skin infection     history  . HTN (hypertension) 11/23/2011  . High cholesterol   . Diabetes mellitus   . Chronic headache     daily  . Anxiety   . Osteoarthritis   . COPD (chronic obstructive pulmonary disease)     patient denies  . Peripheral neuropathy   . 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 CHF (congestive heart failure)   . Medical non-compliance   . Heart disease   . Cognitive dysfunction   . Back pain     has ADENOCARCINOMA, PROSTATE; DM type 2 causing complication; HYPERLIPIDEMIA; HTN (hypertension); Cardiomyopathy, nonischemic; Pyelonephritis; Hydronephrosis of right kidney; Acute on chronic systolic CHF (congestive heart failure); Acute CHF; CHF (congestive heart failure); CHF (congestive heart failure), NYHA class I; Acute systolic CHF (congestive heart failure); Accelerated hypertension; Chronic systolic CHF (congestive heart failure); Back pain; Hydronephrosis, right; Retained ureteral stent; and UTI (lower urinary tract infection) on his problem list.     has No Known Allergies.  We administered leuprolide.  Past Surgical History  Procedure Laterality Date  . Transurethral resection of prostate  03/2008  . Cardiac catheterization  02/2009    minimal non-obstructive CAD  . Cystoscopy w/ ureteral stent placement Right 04/06/2013    Procedure: CYSTOSCOPY WITH RETROGRADE  PYELOGRAM/URETERAL STENT PLACEMENT;  Surgeon: Marissa Nestle, MD;  Location: [facility];  Service: Urology;  Laterality: Right;  . Cystoscopy w/ ureteral stent removal Right 04/06/2013    Procedure: CYSTOSCOPY WITH STENT REMOVAL;  Surgeon:  Marissa Nestle, MD;  Location: [facility];  Service: Urology;  Laterality: Right;    Denies any headaches, dizziness, double vision, fevers, chills, night sweats, nausea, vomiting, diarrhea, constipation, chest pain, heart palpitations, shortness of breath, blood in stool, black tarry stool, urinary pain, urinary burning, urinary frequency, hematuria.   PHYSICAL EXAMINATION  ECOG PERFORMANCE STATUS: 2 - Symptomatic, <50% confined to bed  Filed Vitals:   10/17/14 1410  BP: 143/86  Pulse: 78  Temp: 97.6 F (36.4 C)  Resp: 22    GENERAL:alert, well nourished, well developed, cooperative and moderate distress SKIN: skin color, texture, turgor are normal, no rashes or significant lesions HEAD: Normocephalic, No masses, lesions, tenderness or abnormalities EYES: normal, PERRLA, EOMI, Conjunctiva are pink and non-injected EARS: External ears normal OROPHARYNX:mucous membranes are moist  NECK: supple, no adenopathy, thyroid normal size, non-tender, without nodularity, no stridor, non-tender, trachea midline LYMPH:  no palpable lymphadenopathy BREAST:not examined LUNGS: clear to auscultation  HEART: regular rate & rhythm ABDOMEN:abdomen soft, non-tender, obese and normal bowel sounds BACK: Back symmetric, no curvature, localizes back pain to midline, upper lumbar area. EXTREMITIES:less then 2 second capillary refill, no joint deformities, effusion, or inflammation, no clubbing, no cyanosis  NEURO: alert & oriented x 3 with fluent speech, no focal motor/sensory deficits    LABORATORY DATA: CBC    Component Value Date/Time   WBC 5.0 10/17/2014 1500   RBC 3.81* 10/17/2014 1500   RBC 3.67* 04/06/2013 1357   HGB 9.9* 10/17/2014 1500   HCT 31.0* 10/17/2014 1500   PLT 246 10/17/2014 1500   MCV 81.4 10/17/2014 1500   MCH 26.0 10/17/2014 1500   MCHC 31.9 10/17/2014 1500   RDW 18.2* 10/17/2014 1500   LYMPHSABS 0.7 10/17/2014 1500   MONOABS 0.6 10/17/2014 1500   EOSABS 0.0 10/17/2014  1500   BASOSABS 0.0 10/17/2014 1500      Chemistry      Component Value Date/Time   NA 144 10/17/2014 1500   K 3.9 10/17/2014 1500   CL 105 10/17/2014 1500   CO2 28 10/17/2014 1500   BUN 20 10/17/2014 1500   CREATININE 0.87 10/17/2014 1500      Component Value Date/Time   CALCIUM 9.1 10/17/2014 1500   ALKPHOS 121* 10/17/2014 1500   AST 14 10/17/2014 1500   ALT 13 10/17/2014 1500   BILITOT 0.6 10/17/2014 1500     Lab Results  Component Value Date   PSA 1.65 07/25/2014   PSA 0.95 04/23/2014   PSA 0.75 01/21/2014      PENDING LABS: PSA   RADIOGRAPHIC STUDIES:  09/19/2014  CLINICAL DATA: Mid back pain, no known injury  EXAM: LUMBAR SPINE - 2-3 VIEW  COMPARISON: CT abdomen and pelvis 09/17/2014  FINDINGS: RIGHT ureteral stent noted.  Bones appear demineralized.  5 non-rib-bearing lumbar vertebrae.  Small amount retained contrast within colon obscuring portions of spine on [facility] view.  Vertebral body and disc space heights in the lumbar spine appear preserved.  Height loss identified at T11 vertebral body demonstrating approximately 30-40% anterior height loss.  This is unchanged since recent CT but new since an earlier CT of 04/05/2013.  No subluxation or additional fracture identified.  No gross evidence of spondylolysis.  SI joints symmetric.  IMPRESSION: Osseous demineralization with  superior endplate compression fracture of T11 new since 04/05/2013 as above.   Electronically Signed  By: Lavonia Dana M.D.  On: 09/19/2014 11:07     09/17/2014  *RADIOLOGY REPORT*  Clinical Data: Increasing PSA, evaluate for prostatic prostate cancer  CT ABDOMEN AND PELVIS WITH CONTRAST  Technique: Multidetector CT imaging of the abdomen and pelvis was performed following the standard protocol during bolus administration of intravenous contrast. Sagittal and coronal MPR images reconstructed from axial data set.  Contrast:  162mL OMNIPAQUE IOHEXOL 300 MG/ML SOLN Dilute oral contrast.  Comparison: None  Findings: Tiny amounts of pleural fluid bilaterally greater on left. Right hydronephrosis and proximal ureteral dilatation with the presence of a right ureteral stent. Distal pigtail of the right ureteral stent is within the urethra. Peripelvic and proximal right peri ureteral edema could be related to stent placement. Minimal pelviectasis of the left kidney without caliectasis or gross hydronephrosis.  Liver, spleen, pancreas, kidneys, and adrenal glands otherwise normal appearance. Left ureter decompressed. Minimal bladder wall thickening. Surgical clips at prostate bed. Small left inguinal hernia containing fat. Normal appendix. Small umbilical hernia containing fat.  Stomach and bowel loops normal appearance. Scattered atherosclerotic calcifications. No mass, adenopathy, free fluid, or inflammatory process. Tiny dense sclerotic focus within left lateral aspect of L4 vertebral body, nonspecific, could represent a small bone but cannot exclude a tiny sclerotic osseous metastasis. No other sclerotic osseous abnormalities identified.  IMPRESSION: Right hydronephrosis and proximal ureteral dilatation with surrounding peri nephric and periureteral edema which could be related to prior right ureteral stent placement. If this stent has not been recently placed, then other causes for peripelvic/perinephric edema should be considered including tumor and perforation. The distal pigtail of the right ureteral stent is within the urethra. No definite evidence of prostate cancer metastasis. Tiny sclerotic focus L4 vertebral body could represent a bone island but cannot exclude a subtle sclerotic metastatic lesion.  Small left inguinal and umbilical hernias containing fat.  Original Report Authenticated By: Burnetta Sabin, M.D.     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 Indwelling Foley catheter removed. S/P Depo-Lupro and Casodex. 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 S/P Depo-Lupon 22.5 mg every 12 weeks and Casodex daily with failure in therapy which lead to a discontinuation of this therapy to switched to Zytiga/Prednisone starting on 07/06/2012. Depo-Lupron was appropriately restarted on 04/19/2013.  2. 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 DM  4. HTN  5. Mild dementia  6. T11 compression fracture, nonpathologic, inducing pain  Patient Active Problem List   Diagnosis Date Noted  . Back pain   . Hydronephrosis, right   . Retained ureteral stent   . UTI (lower urinary tract infection)   . Chronic systolic CHF (congestive heart failure) 09/17/2014  . CHF (congestive heart failure), NYHA class I 05/19/2014  . Acute systolic CHF (congestive heart failure) 05/19/2014  . Accelerated hypertension 05/19/2014  . CHF (congestive heart failure) 11/24/2013  . Acute CHF 11/13/2013  . Acute on chronic systolic CHF (congestive heart failure) 05/10/2013  . Pyelonephritis 04/05/2013  . Hydronephrosis of right kidney 04/05/2013  . Cardiomyopathy, nonischemic 02/28/2012  . HTN (hypertension) 11/23/2011  . DM type 2 causing complication 71/69/6789  . HYPERLIPIDEMIA 09/21/2010  . ADENOCARCINOMA, PROSTATE 06/22/2010     PLAN:  1. I personally reviewed and went over laboratory results with the patient.  The results are noted  within this dictation. 2. I personally reviewed and went over radiographic studies with the patient.  The results are noted within this dictation.   3. Chart reviewed 4. Bone scan within the next few weeks 5. Rx for Zytiga 6. Prednisone 5 mg BID e-scribed to [facility] 7. Rx for Percocet 5/325 #75 every 4 hours PRN pain 8. Patient education regarding percocet.  Discussed risks, benefits, alternatives, and  side effects including constipation and drowsiness.  Education provided regarding frequency. 9. Referral to [facility] in [place] for establishment of care. 10. Return in 3-4 weeks for follow-up   THERAPY PLAN:  We will evaluate his prostate cancer status with PSA and bone scan.  We will continue with Zytiga/Prednisone daily.  I will see him back in 3-4 weeks to evaluate pain control.  At the time of progression, he may be a candidate for Xtandi, but systemic chemotherapy is not a likely option when he progresses given his performance status.  All questions were answered. The patient knows to call the clinic with any problems, questions or concerns. We can certainly see the patient much sooner if necessary.  Patient and plan discussed with Dr. Farrel Gobble and he is in agreement with the aforementioned.   [facility] [place] 10/17/2014

## 2014-10-17 NOTE — Patient Instructions (Signed)
[facility] Discharge Instructions  RECOMMENDATIONS MADE BY THE CONSULTANT AND ANY TEST RESULTS WILL BE SENT TO YOUR REFERRING PHYSICIAN.  Due to your back pain, I provided you a prescription for Percocet, since your primary care provider did not.  Fortunately, your back pain is not from your cancer to our knowledge.  I reviewed your CT which shows a thoracic spine #11 fracture.  It is new since May 2014.  I gave you a prescription for Percocet.  This has Tylenol in it so do not take any more tylenol at home.  You can take this medication every 4 hours if needed.  You may not need to take it every 4 hours, only take it if you have pain.  This medication may cause drowsiness and constipation.  Continue your stool softener and increase the dose if needed.  We will get your scheduled for a bone scan in the next few weeks. Please call us in 1-2 weeks if pain medication is not working. Continue your "cancer pills" which also include Prednisone.   Return in 3-4 weeks for follow-up and to re-evaluate your pain.  Please call the [facility] with any questions or concerns.   Merry Christmas  Thank you for choosing [facility] to provide your oncology and hematology care.  To afford each patient quality time with our providers, please arrive at least 15 minutes before your scheduled appointment time.  With your help, our goal is to use those 15 minutes to complete the necessary work-up to ensure our physicians have the information they need to help with your evaluation and healthcare recommendations.    Effective January 1st, 2014, we ask that you re-schedule your appointment with our physicians should you arrive 10 or more minutes late for your appointment.  We strive to give you quality time with our providers, and arriving late affects you and other patients whose appointments are after yours.    Again, thank you for choosing [facility].  Our  hope is that these requests will decrease the amount of time that you wait before being seen by our physicians.       _____________________________________________________________  Should you have questions after your visit to [facility], please contact our office at [phone] between the hours of 8:30 a.m. and 5:00 p.m.  Voicemails left after 4:30 p.m. will not be returned until the following business day.  For prescription refill requests, have your pharmacy contact our office with your prescription refill request.

## 2014-10-18 ENCOUNTER — Telehealth (HOSPITAL_COMMUNITY): Payer: Self-pay | Admitting: Hematology & Oncology

## 2014-10-18 LAB — PSA: PSA: 2.65 ng/mL (ref <=4.00)

## 2014-10-18 LAB — TESTOSTERONE: Testosterone: 12 ng/dL — ABNORMAL LOW (ref 300–890)

## 2014-10-18 NOTE — Telephone Encounter (Signed)
Pt has urology appt with Dr Diona Fanti on 12/31/14 @ 1:30

## 2014-10-22 ENCOUNTER — Encounter (HOSPITAL_COMMUNITY): Payer: Self-pay | Admitting: Lab

## 2014-10-22 NOTE — Progress Notes (Signed)
Appt for [facility] is for 2/23 at 1:30 in [place].

## 2014-11-04 ENCOUNTER — Encounter (HOSPITAL_COMMUNITY)
Admission: RE | Admit: 2014-11-04 | Discharge: 2014-11-04 | Disposition: A | Discharge status: Home / Self Care | Payer: Medicare Other | Source: Ambulatory Visit | Attending: Oncology | Admitting: Oncology

## 2014-11-04 ENCOUNTER — Encounter (HOSPITAL_COMMUNITY): Payer: Self-pay

## 2014-11-04 DIAGNOSIS — C61 Malignant neoplasm of prostate: Secondary | ICD-10-CM

## 2014-11-04 MED ORDER — TECHNETIUM TC 99M MEDRONATE IV KIT: 25.0000 | PACK | Freq: Once | INTRAVENOUS | Status: AC | PRN

## 2014-11-06 ENCOUNTER — Ambulatory Visit (HOSPITAL_COMMUNITY): Payer: Medicare Other | Admitting: Oncology

## 2014-11-08 ENCOUNTER — Encounter (HOSPITAL_COMMUNITY): Payer: Self-pay | Admitting: Oncology

## 2014-11-08 DIAGNOSIS — S22080A Wedge compression fracture of T11-T12 vertebra, initial encounter for closed fracture: Secondary | ICD-10-CM | POA: Insufficient documentation

## 2014-11-08 NOTE — Assessment & Plan Note (Addendum)
Secondary to T11 compression fracture. Started Percocet 5/325 mg every 4 hours PRN pain on 10/17/2014, with little effect.  I have changed pain medication to Fentanyl 25 mcg/hr every 3 days and Percocet for breakthrough pain management.  Will re-evaluate pain in 2-3 days and adjust Fentanyl dose accordingly.

## 2014-11-08 NOTE — Assessment & Plan Note (Addendum)
Nonpathologic per CT abd/pelvis in November 2015.  Will perform MRI of L spine to verify pathology of fracture.  If negative for metastatic disease, utility of Delton See is questionable.  If positive, Delton See will be initiated.  Rx for Oscal-D BID.

## 2014-11-08 NOTE — Progress Notes (Signed)
Oscar Curet, MD [place] [address]  ADENOCARCINOMA, PROSTATE - Plan: DG Ribs Unilateral Right, MR Lumbar Spine W Wo Contrast, fentaNYL (DURAGESIC - DOSED MCG/HR) 25 MCG/HR patch, oxyCODONE-acetaminophen (PERCOCET/ROXICET) 5-325 MG per tablet, enzalutamide (XTANDI) 40 MG capsule, CANCELED: MR Lumbar Spine W Contrast  Traumatic compression fracture of T11 thoracic vertebra, with routine healing, subsequent encounter - Plan: MR Lumbar Spine W Wo Contrast, fentaNYL (DURAGESIC - DOSED MCG/HR) 25 MCG/HR patch, oxyCODONE-acetaminophen (PERCOCET/ROXICET) 5-325 MG per tablet, calcium-vitamin D (OSCAL WITH D) 500-200 MG-UNIT per tablet  Midline low back pain without sciatica  Vertebral compression fracture, sequela - Plan: fentaNYL (DURAGESIC - DOSED MCG/HR) 25 MCG/HR patch, oxyCODONE-acetaminophen (PERCOCET/ROXICET) 5-325 MG per tablet, calcium-vitamin D (OSCAL WITH D) 500-200 MG-UNIT per tablet  CURRENT THERAPY: Depo Lupron 22.5 mg every 3 months intramuscularly last given on 10/17/2014, Zytiga/prednisone daily started on 07/06/2012. But now with findings concerning for progression of disease.   INTERVAL HISTORY: Oscar Hickman 74 y.o. male returns for followup of metastatic castrate resistant prostate cancer, S/P TURP therapy in 2009 followed by external beam radiotherapy.  Started treatment with Zytiga and prednisone on 07/06/2012.  I personally reviewed and went over laboratory results with the patient.  The results are noted within this dictation.  PSA over the last 6 months has increased from 0.95----1.65----2.65.  This is concerning for biochemical progression of disease.  On our last encounter, the patient complained of new low back pain that was interfering with QOL.  Therefore, this was addressed with pain medication and NM bone scan to evaluate for boney disease.  I personally reviewed and went over radiographic studies with the patient.  The results  are noted within this dictation.  There are findings that are concerning for metastatic disease versus fracture and therefore more imaging tests will be performed to further evaluate these areas.  He reports that the pain medication, Percocet, I was giving him for pain control were not effective.  Therefore, I added a long-acting pain medication, Fentanyl 25 mcg/hr every 3 days and refill on percocet for breakthrough pain management.  He is agreeable;e to this plan and I think this may be the less confusing regimen for him and his wife.  Given the new findings, I am deeming him a Zytiga/Prednisone failure and moving on to next line therapy consisting of Xtandi.  I would question and favor continuing prednisone for a QOL.  Other than pain, he has no oncologic complaints.    Unfortunately, his wife needs to undergo cardiac surgery which will require 1 week hospitalization and possibly SNF for rehab.  This does not leave anyone to help [name] at home who is demented and not able to care for himself entirely.  Without family support, I am worried about his well being during this time.  His wife seconds this idea.  I have reached out to our Education officer, museum for recommendations.  Oncologically, ROS questioning is otherwise negative.  Past Medical History  Diagnosis Date  . Skin infection     history  . HTN (hypertension) 11/23/2011  . High cholesterol   . Diabetes mellitus   . Chronic headache     daily  . Anxiety   . Osteoarthritis   . COPD (chronic obstructive pulmonary disease)     patient denies  . Peripheral neuropathy   . 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 CHF (congestive heart failure)   . Medical  non-compliance   . Heart disease   . Cognitive dysfunction   . Back pain   . Traumatic compression fracture of T11 thoracic vertebra 11/08/2014    has ADENOCARCINOMA, PROSTATE; DM type 2 causing complication; HYPERLIPIDEMIA; HTN (hypertension); Cardiomyopathy,  nonischemic; Hydronephrosis of right kidney; CHF (congestive heart failure), NYHA class I; Accelerated hypertension; Chronic systolic CHF (congestive heart failure); Back pain; Retained ureteral stent; and Traumatic compression fracture of T11 thoracic vertebra on his problem list.     has No Known Allergies.  [name] had no medications administered during this visit.  Past Surgical History  Procedure Laterality Date  . Transurethral resection of prostate  03/2008  . Cardiac catheterization  02/2009    minimal non-obstructive CAD  . Cystoscopy w/ ureteral stent placement Right 04/06/2013    Procedure: CYSTOSCOPY WITH RETROGRADE PYELOGRAM/URETERAL STENT PLACEMENT;  Surgeon: Marissa Nestle, MD;  Location: [facility];  Service: Urology;  Laterality: Right;  . Cystoscopy w/ ureteral stent removal Right 04/06/2013    Procedure: CYSTOSCOPY WITH STENT REMOVAL;  Surgeon: Marissa Nestle, MD;  Location: [facility];  Service: Urology;  Laterality: Right;    Denies any headaches, dizziness, double vision, fevers, chills, night sweats, nausea, vomiting, diarrhea, constipation, chest pain, heart palpitations, shortness of breath, blood in stool, black tarry stool, urinary pain, urinary burning, urinary frequency, hematuria.   PHYSICAL EXAMINATION  ECOG PERFORMANCE STATUS: 2 - Symptomatic, <50% confined to bed  Filed Vitals:   11/11/14 1420  BP: 134/78  Pulse: 78  Temp: 98 F (36.7 C)  Resp: 20    GENERAL:alert, well nourished, well developed, comfortable, cooperative, smiling and mild dementia SKIN: skin color, texture, turgor are normal, no rashes or significant lesions HEAD: Normocephalic, No masses, lesions, tenderness or abnormalities EYES: normal, PERRLA, EOMI, Conjunctiva are pink and non-injected EARS: External ears normal OROPHARYNX:lips, buccal mucosa, and tongue normal and mucous membranes are moist  NECK: supple, trachea midline LYMPH:  no palpable lymphadenopathy BREAST:not  examined LUNGS: clear to auscultation  HEART: regular rate & rhythm ABDOMEN:abdomen soft and normal bowel sounds BACK: Back symmetric, no curvature. EXTREMITIES:less then 2 second capillary refill, no joint deformities, effusion, or inflammation, no cyanosis  NEURO: alert & oriented x 3 with fluent speech, no focal motor/sensory deficits, in wheelchair   LABORATORY DATA: CBC    Component Value Date/Time   WBC 5.0 10/17/2014 1500   RBC 3.81* 10/17/2014 1500   RBC 3.67* 04/06/2013 1357   HGB 9.9* 10/17/2014 1500   HCT 31.0* 10/17/2014 1500   PLT 246 10/17/2014 1500   MCV 81.4 10/17/2014 1500   MCH 26.0 10/17/2014 1500   MCHC 31.9 10/17/2014 1500   RDW 18.2* 10/17/2014 1500   LYMPHSABS 0.7 10/17/2014 1500   MONOABS 0.6 10/17/2014 1500   EOSABS 0.0 10/17/2014 1500   BASOSABS 0.0 10/17/2014 1500      Chemistry      Component Value Date/Time   NA 144 10/17/2014 1500   K 3.9 10/17/2014 1500   CL 105 10/17/2014 1500   CO2 28 10/17/2014 1500   BUN 20 10/17/2014 1500   CREATININE 0.87 10/17/2014 1500      Component Value Date/Time   CALCIUM 9.1 10/17/2014 1500   ALKPHOS 121* 10/17/2014 1500   AST 14 10/17/2014 1500   ALT 13 10/17/2014 1500   BILITOT 0.6 10/17/2014 1500      Lab Results  Component Value Date   PSA 2.65 10/17/2014   PSA 1.65 07/25/2014   PSA 0.95 04/23/2014  RADIOGRAPHIC STUDIES:  Dg Ribs Unilateral Right  11/11/2014   CLINICAL DATA:  Abnormal rib uptake on the recent nuclear medicine bone scan. Prostate cancer.  EXAM: RIGHT RIBS - 2 VIEW  COMPARISON:  Bone scan 11/04/2014.  FINDINGS: No visible fracture noted within the right ribs. In particular within the anterior right ribs. No visible focal rib lesion.  There is cardiomegaly with vascular congestion. No focal opacity in the right lung.  IMPRESSION: No focal airspace no visible right rib fracture or focal rib lesion.   Electronically Signed   By: Rolm Baptise M.D.   On: 11/11/2014 16:11   Nm Bone  Scan Whole Body  11/04/2014   CLINICAL DATA:  Prostate cancer.  EXAM: NUCLEAR MEDICINE WHOLE BODY BONE SCAN  TECHNIQUE: Whole body anterior and posterior images were obtained approximately 3 hours after intravenous injection of radiopharmaceutical.  RADIOPHARMACEUTICALS:  25.0 mCi Technetium-99 MDP  COMPARISON:  CT 09/17/2014.  Bone scan 04/20/2012.  FINDINGS: Bilateral renal function and excretion is present. Increased activity noted in the region of T11 and T12 vertebral bodies. Prior CT of 09/17/2014 reveals a compression fracture of T11. MRI of the thoracic spine should be considered for further evaluation. Punctate increased activity noted over the right anterior fourth and seventh ribs. Although these changes could be secondary to fracture, metastatic lesions could present in this fashion. No other focal bony abnormalities noted to suggest metastatic disease. Mild increased activity noted over the perineal region is most likely from urine contamination. If patient has symptoms in the perineal region an AP view of the pelvis could be obtained to exclude a focal bony lesion .  IMPRESSION: 1. Increased activity noted in the T11 and T12 vertebral bodies. Recent CT of 09/17/2014 reveals compression fracture of T1. MRI lumbar spine should be considered for further evaluation. 2. Punctate area of increased activity noted in the anterior most aspects of the right fourth and seventh ribs. Although these changes could secondary to fractures, metastatic disease cannot be excluded. Right rib series can be obtained to further evaluate. 3. Increased activity noted of the perineal region. These changes are most likely secondary to urine contamination. If the patient has symptoms in the perineal region AP view of the pelvis can be obtained to exclude a of focal bony lesion.   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  Now on Depo-Lupro, Zytiga, and Prednisone after progression noted on Casodex.  Now with indication of biochemical progression of disease with increasing PSA.   Lab Results  Component Value Date   PSA 2.65 10/17/2014   PSA 1.65 07/25/2014   PSA 0.95 04/23/2014   NM bone scan reveals areas that need further imaging to evaluate for metastatic disease.  Therefore, right rib series plain films and MRI L spine w and without contrast to evaluate for pathologic fracture. He will continue with Depo-Lupron, Zytiga, and Prednisone until we can get him Xtandi.  He will need chemotherapy teaching for this change in therapy.  Once approved, we will continue Depo-Lupron and start Xtandi.  The role of Prednisone is questionable, but I would favor continuing for QOL and stop if there were prednisone-induced issues or complications.  Labs in 2-3 weeks: CBC  diff, CMET, PSA.  Return in 2-3 weeks for follow-up.  Traumatic compression fracture of T11 thoracic vertebra Nonpathologic per CT abd/pelvis in November 2015.  Will perform MRI of L spine to verify pathology of fracture.  If negative for metastatic disease, utility of Delton See is questionable.  If positive, Delton See will be initiated.  Rx for Oscal-D BID.  Back pain Secondary to T11 compression fracture. Started Percocet 5/325 mg every 4 hours PRN pain on 10/17/2014, with little effect.  I have changed pain medication to Fentanyl 25 mcg/hr every 3 days and Percocet for breakthrough pain management.  Will re-evaluate pain in 2-3 days and adjust Fentanyl dose accordingly.   THERAPY PLAN:  Today, right rib films are to be performed to evaluate for metastatic disease.  Additionally, MRI of L spine will be performed within the next 1-2 weeks to evaluate for  metastatic disease.  With increase PSA results, and concern for progression of disease, we will switch to Xtandi + Depo-Lupron.  He will continue Zytiga/Prednisone in the interim.  All questions were answered. The patient knows to call the clinic with any problems, questions or concerns. We can certainly see the patient much sooner if necessary.  Patient and plan discussed with Dr. Ancil Linsey and she is in agreement with the aforementioned.   [name],[name] 11/11/2014

## 2014-11-08 NOTE — Assessment & Plan Note (Addendum)
Recurrent poorly differentiated carcinoma of the prostate involving the penile urethra S/P biopsy and fulguration March 2011 after undergoing TURP followed by radiation in 2009 for a Gleason score of 9 adenocarcinoma of prostate.  Now on Depo-Lupro, Zytiga, and Prednisone after progression noted on Casodex.  Now with indication of biochemical progression of disease with increasing PSA.   Lab Results  Component Value Date   PSA 2.65 10/17/2014   PSA 1.65 07/25/2014   PSA 0.95 04/23/2014   NM bone scan reveals areas that need further imaging to evaluate for metastatic disease.  Therefore, right rib series plain films and MRI L spine w and without contrast to evaluate for pathologic fracture. He will continue with Depo-Lupron, Zytiga, and Prednisone until we can get him Xtandi.  He will need chemotherapy teaching for this change in therapy.  Once approved, we will continue Depo-Lupron and start Xtandi.  The role of Prednisone is questionable, but I would favor continuing for QOL and stop if there were prednisone-induced issues or complications.  Labs in 2-3 weeks: CBC diff, CMET, PSA.  Return in 2-3 weeks for follow-up.

## 2014-11-11 ENCOUNTER — Encounter (HOSPITAL_COMMUNITY): Payer: Medicare HMO | Attending: Oncology | Admitting: Oncology

## 2014-11-11 ENCOUNTER — Ambulatory Visit (HOSPITAL_COMMUNITY)
Admission: RE | Admit: 2014-11-11 | Discharge: 2014-11-11 | Disposition: A | Discharge status: Home / Self Care | Payer: Medicare HMO | Source: Ambulatory Visit | Attending: Oncology | Admitting: Oncology

## 2014-11-11 ENCOUNTER — Encounter (HOSPITAL_COMMUNITY): Payer: Self-pay | Admitting: Oncology

## 2014-11-11 VITALS — BP 134/78 | HR 78 | Temp 98.0°F | Resp 20 | Wt 185.0 lb

## 2014-11-11 DIAGNOSIS — I878 Other specified disorders of veins: Secondary | ICD-10-CM | POA: Insufficient documentation

## 2014-11-11 DIAGNOSIS — I517 Cardiomegaly: Secondary | ICD-10-CM | POA: Diagnosis not present

## 2014-11-11 DIAGNOSIS — C61 Malignant neoplasm of prostate: Secondary | ICD-10-CM | POA: Diagnosis not present

## 2014-11-11 DIAGNOSIS — S22089G Unspecified fracture of T11-T12 vertebra, subsequent encounter for fracture with delayed healing: Secondary | ICD-10-CM | POA: Diagnosis not present

## 2014-11-11 DIAGNOSIS — R938 Abnormal findings on diagnostic imaging of other specified body structures: Secondary | ICD-10-CM | POA: Diagnosis present

## 2014-11-11 DIAGNOSIS — IMO0001 Reserved for inherently not codable concepts without codable children: Secondary | ICD-10-CM

## 2014-11-11 DIAGNOSIS — M545 Low back pain, unspecified: Secondary | ICD-10-CM

## 2014-11-11 DIAGNOSIS — M4850XS Collapsed vertebra, not elsewhere classified, site unspecified, sequela of fracture: Secondary | ICD-10-CM

## 2014-11-11 DIAGNOSIS — S22080D Wedge compression fracture of T11-T12 vertebra, subsequent encounter for fracture with routine healing: Secondary | ICD-10-CM

## 2014-11-11 MED ORDER — OXYCODONE-ACETAMINOPHEN 5-325 MG PO TABS: 1.0000 | ORAL_TABLET | ORAL | Status: DC | PRN

## 2014-11-11 MED ORDER — ENZALUTAMIDE 40 MG PO CAPS: 160.0000 mg | ORAL_CAPSULE | Freq: Every day | ORAL | Status: DC

## 2014-11-11 MED ORDER — CALCIUM CARBONATE-VITAMIN D 500-200 MG-UNIT PO TABS: 2.0000 | ORAL_TABLET | Freq: Every day | ORAL | Status: AC

## 2014-11-11 MED ORDER — FENTANYL 25 MCG/HR TD PT72: 25.0000 ug | MEDICATED_PATCH | TRANSDERMAL | Status: DC

## 2014-11-11 NOTE — Patient Instructions (Addendum)
[facility] Discharge Instructions  RECOMMENDATIONS MADE BY THE CONSULTANT AND ANY TEST RESULTS WILL BE SENT TO YOUR REFERRING PHYSICIAN.  We will get an xray of your right ribs today on your way home.  We will get you set up for an MRI of back in the near future. Continue your chemo pills for now.  We will work on getting you new ones.  I will refill your pain medications. I will give you an Rx for a new pain medication that is long-acting.  This is called Fentanyl.  This is patch that is placed every 3 days.  After placing the patch on, your remove it on the third day and place a new patch on your body where you have some fat. If/when your pain increases, even with the patch, you can take your pain pills to help decrease the pain again. I have also given you a prescription for ca and Vit d.  Please take this medication every day, twice per day. Return in 2-3 weeks for follow-up.  Thank you for choosing [facility] to provide your oncology and hematology care.  To afford each patient quality time with our providers, please arrive at least 15 minutes before your scheduled appointment time.  With your help, our goal is to use those 15 minutes to complete the necessary work-up to ensure our physicians have the information they need to help with your evaluation and healthcare recommendations.    Effective January 1st, 2014, we ask that you re-schedule your appointment with our physicians should you arrive 10 or more minutes late for your appointment.  We strive to give you quality time with our providers, and arriving late affects you and other patients whose appointments are after yours.    Again, thank you for choosing [facility].  Our hope is that these requests will decrease the amount of time that you wait before being seen by our physicians.       _____________________________________________________________  Should you have questions after  your visit to [facility], please contact our office at [phone] between the hours of 8:30 a.m. and 5:00 p.m.  Voicemails left after 4:30 p.m. will not be returned until the following business day.  For prescription refill requests, have your pharmacy contact our office with your prescription refill request.

## 2014-11-13 ENCOUNTER — Encounter: Payer: Self-pay | Admitting: *Deleted

## 2014-11-13 ENCOUNTER — Telehealth (HOSPITAL_COMMUNITY): Payer: Self-pay | Admitting: *Deleted

## 2014-11-13 NOTE — Telephone Encounter (Signed)
Pt's wife instructed to take off the old patch and put on 2 patches for better pain control. I also instructed pt's wife to give the patient his Oxycodone q4h. She said ok.

## 2014-11-13 NOTE — Progress Notes (Signed)
[facility] Clinical Social Work  Clinical Social Work was referred by Engineer, mining for assessment of psychosocial needs due to Pt's wife having heart surgery soon and pt needing care in her absence.  Clinical Social Worker phoned patient at home to offer support and assess for needs. CSW spoke with pt's granddaughter and left message for wife to return CSW call in order to problem solve on this issue. CSW has found a couple of possible options in the area, but all would require pvt pay. CSW awaits return call and provided both of CSW numbers for contact.    Clinical Social Work interventions:  Resource assistance    Loren Racer, [place] Tuesdays 8:30-1pm Wednesdays 8:30-12pm  [phone]

## 2014-11-14 ENCOUNTER — Telehealth (HOSPITAL_COMMUNITY): Payer: Self-pay | Admitting: *Deleted

## 2014-11-14 NOTE — Telephone Encounter (Signed)
I called patient's wife and notified her that patient had a MRI tomorrow Friday @ 1100 and that [facility] would be picking him up tomorrow. He needs to arrive at Radiology by 1045. [facility] notified of the appt change and a fax was sent to them regarding this and they were agreeable to picking him up tomorrow and bringing him here. Since patient increased his pain patches to 2 pain patches yday his pain is better per wife.

## 2014-11-14 NOTE — Patient Instructions (Addendum)
[place]   CHEMOTHERAPY INSTRUCTIONS  Who is Oscar Hickman for?   Oscar Hickman is a prescription medicine used to treat men with prostate cancer that no longer responds to a medical or surgical treatment that lowers testosterone and that has spread to other parts of the body. (This is a type of advanced prostate cancer.)   How should I take XTANDI? Take XTANDI exactly as your healthcare provider tells you.  Take your prescribed dose of XTANDI one time a day, at the same time each day.  Your healthcare provider may change your dose if needed.  Do not change or stop taking your prescribed dose of XTANDI without talking with your healthcare provider first.  Oscar Hickman can be taken with or without food.  Swallow XTANDI capsules whole. Do not chew, dissolve, or open the capsules.  If you miss a dose of XTANDI, take your prescribed dose as soon as you remember that day. If you miss your daily dose, take your prescribed dose at your regular time the next day. Do not take more than your prescribed dose of XTANDI in one day.  If you take too much XTANDI, call your healthcare provider or go to the nearest emergency room right away. You may have an increased risk of seizure if you take too much XTANDI.  What are the possible side effects of XTANDI?  Oscar Hickman may cause serious side effects including: Seizure. If you take Oscar Hickman you may be at risk of having a seizure. You should avoid activities where a sudden loss of consciousness could cause serious harm to yourself or others. Tell your healthcare provider right away if you have loss of consciousness or seizure. Your healthcare provider will stop Oscar Hickman if you have a seizure during treatment.  Posterior Reversible Encephalopathy Syndrome (PRES). If you take Oscar Hickman you may be at risk of developing a condition involving the brain called PRES. Tell your healthcare provider right away if you have a seizure or quickly worsening symptoms  such as headache, decreased alertness, confusion, reduced eyesight, blurred vision or other visual problems. Your healthcare provider will do a test to check for PRES. Your healthcare provider will stop Oscar Hickman if you develop PRES.  The most common side effects of XTANDI include weakness or feeling more tired than usual, back pain, decreased appetite, constipation, joint pain, diarrhea, hot flashes, upper respiratory tract infection, swelling in your hands, arms, legs, or feet, shortness of breath, muscle and bone pain, weight loss, headache, high blood pressure, dizziness, and a feeling that you or things around you are moving or spinning (vertigo).  XTANDI may cause infections, falls and injuries from falls. Tell your healthcare provider if you have signs or symptoms of an infection or if you fall.   Xtandi 40mg  capsule. Take 4 capsules by mouth everyday. Take at the same time every day. Do not double dose. Do not cut, dissolve, or open capsules. Swallow capsules whole. You may take this with or without food.    EDUCATIONAL MATERIALS GIVEN AND REVIEWED: Specific Instructions Sheets: Xtandi   SELF CARE ACTIVITIES WHILE ON CHEMOTHERAPY: Increase your fluid intake 48 hours prior to treatment and drink at least 2 quarts per day after treatment., No alcohol intake., No aspirin or other medications unless approved by your oncologist., Eat foods that are light and easy to digest., Eat foods at cold or room temperature., No fried, fatty, or spicy foods immediately before or after treatment., Have teeth cleaned professionally before starting treatment. Keep dentures  and partial plates clean., Use soft toothbrush and do not use mouthwashes that contain alcohol. Biotene is a good mouthwash that is available at most pharmacies or may be ordered by calling [phone]., Use warm salt water gargles (1 teaspoon salt per 1 quart warm water) before and after meals and at bedtime. Or you may rinse with 2  tablespoons of three -percent hydrogen peroxide mixed in eight ounces of water., Always use sunscreen with SPF (Sun Protection Factor) of 30 or higher., Use your nausea medication as directed to prevent nausea., Use your stool softener or laxative as directed to prevent constipation. and Use your anti-diarrheal medication as directed to stop diarrhea.  Please wash your hands for at least 30 seconds using warm soapy water. Handwashing is the #1 way to prevent the spread of germs. Stay away from sick people or people who are getting over a cold. If you develop respiratory systems such as green/yellow mucus production or productive cough or persistent cough let us know and we will see if you need an antibiotic. It is a good idea to keep a pair of gloves on when going into grocery stores/[employer] to decrease your risk of coming into contact with germs on the carts, etc. Carry alcohol hand gel with you at all times and use it frequently if out in public. All foods need to be cooked thoroughly. No raw foods. No medium or undercooked meats, eggs. If your food is cooked medium well, it does not need to be hot pink or saturated with bloody liquid at all. Vegetables and fruits need to be washed/rinsed under the faucet with a dish detergent before being consumed. You can eat raw fruits and vegetables unless we tell you otherwise but it would be best if you cooked them or bought frozen. Do not eat off of salad bars or hot bars unless you really trust the cleanliness of the restaurant. If you need dental work, please let Dr. Whitney Muse know before you go for your appointment so that we can coordinate the best possible time for you in regards to your chemo regimen. You need to also let your dentist know that you are actively taking chemo. We may need to do labs prior to your dental appointment. We also want your bowels moving at least every other day. If this is not happening, we need to know so that we can get you on a bowel  regimen to help you go.    MEDICATIONS: You have been given prescriptions for the following medications:  Xtandi 40mg  capsule. Take 4 capsules by mouth everyday. Take at the same time every day. Do not double dose. Do not cut, dissolve, or open capsules. Swallow capsules whole. You may take this with or without food.   Over-the-Counter Meds:  Senna - this is a mild laxative used to treat mild constipation. May take 1-8 tabs by mouth daily as needed for mild constipation.  Milk of Magnesia - this is a laxative used to treat moderate to severe constipation. May take 2-4 tablespoons every 8 hours as needed. May increase to 8 tablespoons x 1 dose and if no bowel movement call the [facility].  Imodium - this is for diarrhea. Take 2 tabs after 1st loose stool and then 1 tab every 2 hours until you go a total of 12 hours without a loose stool. Call [facility] if loose stools continue.   SYMPTOMS TO REPORT AS SOON AS POSSIBLE AFTER TREATMENT:  FEVER GREATER THAN 100.5 F  CHILLS WITH OR WITHOUT FEVER  NAUSEA AND VOMITING THAT IS NOT CONTROLLED WITH YOUR NAUSEA MEDICATION  UNUSUAL SHORTNESS OF BREATH  UNUSUAL BRUISING OR BLEEDING  TENDERNESS IN MOUTH AND THROAT WITH OR WITHOUT PRESENCE OF ULCERS  URINARY PROBLEMS  BOWEL PROBLEMS  UNUSUAL RASH    Wear comfortable clothing and clothing appropriate for easy access to any Portacath or PICC line. Let us know if there is anything that we can do to make your therapy better!      I have been informed and understand all of the instructions given to me and have received a copy. I have been instructed to call the clinic [phone] or my family physician as soon as possible for continued medical care, if indicated. I do not have any more questions at this time but understand that I may call the [facility] or the Patient Navigator at [phone] during office hours should I have questions or need assistance in obtaining follow-up  care.           Enzalutamide capsules What is this medicine? ENZALUTAMIDE blocks the effect of the male hormone called testosterone. This medicine is used for certain types of prostate cancer. This medicine may be used for other purposes; ask your health care provider or pharmacist if you have questions. COMMON BRAND NAME(S): XTANDI What should I tell my health care provider before I take this medicine? They need to know if you have any of these conditions: -brain tumor -head injury -history of stroke -seizures -an unusual or allergic reaction to enzalutamide, other medicines, foods, dyes, or preservatives -pregnant or trying to get pregnant -breast-feeding How should I use this medicine? Take this medicine by mouth with a glass of water. You can take it with or without food. If it upsets your stomach, take it with food. Follow the directions on the prescription label. Do not cut, crush, or chew this medicine. Take your medicine at regular intervals. Do not take it more often than directed. Do not stop taking except on your doctor's advice. Talk to your pediatrician regarding the use of this medicine in children. Special care may be needed. Overdosage: If you think you've taken too much of this medicine contact a poison control center or emergency room at once. Overdosage: If you think you have taken too much of this medicine contact a poison control center or emergency room at once. NOTE: This medicine is only for you. Do not share this medicine with others. What if I miss a dose? If you miss a dose, take it as soon as you can. If it is almost time for your next dose, take only that dose and skip your missed dose. Do not take double or extra doses. What may interact with this medicine? This medicine may interact with the following medications: -alfentanil -bosentan -certain medications for seizures like carbamazepine, phenobarbital, and  phenytoin -cyclosporine -dihydroergotamine -efavirenz -ergotamine -etravirine -fentanyl -gemfibrozil -midazolam -modafinil -nafcillin -omeprazole -pimozide -quinidine -rifabutin -rifampin -rifapentine -St. John's Wort -sirolimus -tacrolimus -warfarin This list may not describe all possible interactions. Give your health care provider a list of all the medicines, herbs, non-prescription drugs, or dietary supplements you use. Also tell them if you smoke, drink alcohol, or use illegal drugs. Some items may interact with your medicine. What should I watch for while using this medicine? This medicine should not be used in women. Men should not father a child while taking this medicine and for 3 months after stopping it. There is a  potential for serious side effects to an unborn child. Talk to your health care professional or pharmacist for more information. Due to a risk of seizures with enzalutamide therapy, use caution when engaging in activities that could result in serious harm to yourself or others if you pass out. What side effects may I notice from receiving this medicine? Side effects that you should report to your doctor or health care professional as soon as possible: -confusion -falls -loss of memory -seizures Side effects that usually do not require medical attention (Report these to your doctor or health care professional if they continue or are bothersome.): -blood in the urine -breathing problems -diarrhea -dizziness -headache -joint pain -pain, tingling, numbness in the hands or feet -swelling of the ankles, feet, hands -unusually weak or tired This list may not describe all possible side effects. Call your doctor for medical advice about side effects. You may report side effects to FDA at 1-800-FDA-1088. Where should I keep my medicine? Keep out of the reach of children. Store between 20 and 25 degrees C (68 and 77 degrees F). Throw away any unused medicine  after the expiration date. NOTE: This sheet is a summary. It may not cover all possible information. If you have questions about this medicine, talk to your doctor, pharmacist, or health care provider.  2015, [employer]vier/[employer] Standard. (2011-07-23 14:59:07)

## 2014-11-15 ENCOUNTER — Ambulatory Visit (HOSPITAL_COMMUNITY)
Admission: RE | Admit: 2014-11-15 | Discharge: 2014-11-15 | Disposition: A | Discharge status: Home / Self Care | Payer: Medicare HMO | Source: Ambulatory Visit | Attending: Oncology | Admitting: Oncology

## 2014-11-15 ENCOUNTER — Encounter (HOSPITAL_COMMUNITY): Payer: Self-pay

## 2014-11-15 ENCOUNTER — Encounter (HOSPITAL_BASED_OUTPATIENT_CLINIC_OR_DEPARTMENT_OTHER): Payer: Medicare HMO | Admitting: Oncology

## 2014-11-15 VITALS — BP 158/102 | HR 93 | Temp 99.1°F | Resp 24

## 2014-11-15 DIAGNOSIS — M4855XA Collapsed vertebra, not elsewhere classified, thoracolumbar region, initial encounter for fracture: Secondary | ICD-10-CM | POA: Diagnosis not present

## 2014-11-15 DIAGNOSIS — Z8546 Personal history of malignant neoplasm of prostate: Secondary | ICD-10-CM | POA: Insufficient documentation

## 2014-11-15 DIAGNOSIS — R937 Abnormal findings on diagnostic imaging of other parts of musculoskeletal system: Secondary | ICD-10-CM | POA: Insufficient documentation

## 2014-11-15 DIAGNOSIS — M545 Low back pain: Secondary | ICD-10-CM | POA: Diagnosis present

## 2014-11-15 DIAGNOSIS — S22080D Wedge compression fracture of T11-T12 vertebra, subsequent encounter for fracture with routine healing: Secondary | ICD-10-CM

## 2014-11-15 DIAGNOSIS — C61 Malignant neoplasm of prostate: Secondary | ICD-10-CM

## 2014-11-15 DIAGNOSIS — N4889 Other specified disorders of penis: Secondary | ICD-10-CM

## 2014-11-15 MED ORDER — GADOBENATE DIMEGLUMINE 529 MG/ML IV SOLN
17.0000 mL | Freq: Once | INTRAVENOUS | Status: AC | PRN
  Administered 2014-11-15: 17 mL via INTRAVENOUS

## 2014-11-15 MED ORDER — FUROSEMIDE 20 MG PO TABS: 40.0000 mg | ORAL_TABLET | Freq: Every day | ORAL | Status: AC

## 2014-11-15 NOTE — Patient Instructions (Signed)
[place] Discharge Instructions  RECOMMENDATIONS MADE BY THE CONSULTANT AND ANY TEST RESULTS WILL BE SENT TO YOUR REFERRING PHYSICIAN.  I discussed your case with Dr. Jeffie Pollock (urologist).  Keep your appointment on 12/31/2014 with Dr. Beatrix Fetters as planned.  Increase Lasix to 40 mg (2 pills) daily x 5 days.  Then go back to once per day doses. Please call us on Monday, 11/18/2014, for an update on pain. Return as scheduled at [facility].  Thank you for choosing [place] at [facility] to  provide your oncology and hematology care. To afford each patient quality  time with our provider, please arrive at least 15 minutes before your  scheduled appointment time.  You need to re-schedule your appointment should you arrive 10 or more  minutes late. We strive to give you quality time with our providers, and  arriving late affects you and other patients whose appointments are after  yours. Also, if you no show three or more times for appointments you may  be dismissed from the clinic at the providers discretion.  Again, thank you for choosing [facility]. Our hope is that  these requests will decrease the amount of time that you wait before being seen by our physicians. _______________________________________________________________  Should you have questions after your visit to [facility], please contact our office at [phone] between the hours of 8:30 a.m. and 5:00 p.m.  Voicemails left after 4:30 p.m. will not be returned until the following business day.  For prescription refill requests, have your pharmacy contact our office with your prescription refill request.

## 2014-11-15 NOTE — Progress Notes (Signed)
Work - in for complaint of bilateral upper leg pain and penile swelling.  Has been occurring off and on for "some time, didn't want to say anything because it's embarrassing."

## 2014-11-15 NOTE — Progress Notes (Signed)
Oscar Hickman is seen as a work-in today for penile edema.  The patient reports that it comes and goes and it is mildly tender.  He denies any scrotal swelling.  He refuses to stand to show me.  He notes that his penis "looks like a boxing glove, without fingers."    On exam, his penis is minimally erythematous, but edema is noted.  He is uncircumcised.  Foreskin is retracted easily and recovered appropriately.  No sores or lesions.  Clean.    I called Dr. Jeffie Pollock and discussed the case with him.  He thinks it is secondary to CHF and peripheral edema.  He does not advise antibiotics given the patient's physical exam.  He recommends altering diuretic medications.  I will increase Lasix to 40 mg daily x 5 days and then return back to 20 mg daily.    He reports that his pain is a little better.  His wife is to call us on Monday with an update on his pain control.  Patient and plan discussed with Dr. Ancil Linsey and she is in agreement with the aforementioned.   Oscar Hickman 11/15/2014

## 2014-11-19 ENCOUNTER — Encounter (HOSPITAL_COMMUNITY): Payer: Self-pay | Admitting: Oncology

## 2014-11-19 ENCOUNTER — Encounter: Payer: Self-pay | Admitting: *Deleted

## 2014-11-19 ENCOUNTER — Ambulatory Visit (HOSPITAL_COMMUNITY): Payer: Commercial Managed Care - HMO

## 2014-11-19 DIAGNOSIS — M810 Age-related osteoporosis without current pathological fracture: Secondary | ICD-10-CM | POA: Insufficient documentation

## 2014-11-19 NOTE — Assessment & Plan Note (Addendum)
Osteoporotic fractures.  No pathologic fracture.  Requiring pain control.  Currently on Fentanyl patch with Oxycodone for breakthrough pain management.

## 2014-11-19 NOTE — Assessment & Plan Note (Addendum)
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 Started treatment with Zytiga and prednisone on 07/06/2012 and Depo-Lupron, but now with biochemical progression of disease needing a change in therapy to [facility] based therapy and continuation of Depo-Lupron.  Labs today: CBC diff, CMET, PSA.  Return in 1 month for follow-up and to evaluate tolerance to therapy.

## 2014-11-19 NOTE — Progress Notes (Signed)
-  Inpatient, cancel outpatient appointment.  Oncology not consulted at this time.-  [name],[name] 11/22/2014

## 2014-11-19 NOTE — Progress Notes (Signed)
[place] Clinical Social Work  Clinical Social Work was referred by Futures trader for assessment of psychosocial needs due to possible need for more support at home for pt due to wife's upcoming surgery.  Clinical Social Worker contacted patient's wife at home to offer support and assess for needs.  Wife shared surgery is not until February and family can also assist. Per wife, pt has become more weak and has less energy. She feels he moves around very slowly and it is difficult for him to get on and off the [employer] bus that they use for transportation. She has been concerned about his pain as well. She reports pt has a walker. Pt has had PT in the past, per wife, but not recently. She would like NP to be aware of this concern. CSW to report to him.   CSW inquired about finances. They currently receive a limited amount of food stamps and used to have medicaid. They are trying to get this restarted and wife plans to call her caseworker today. CSW educated wife about CNA services through [employer] at $10 an hour and adult daycare at $35 a day. She reports they cannot afford that currently. If pt were to get medicaid again, he could get CNA services through them at no cost as medicaid could cover this. Wife to follow up on medicaid and is aware we can further assist after that is in place. She appreciated call and is aware of how to contact CSW as needed. CSW to share info with team and possible need for PT/STAR referral.  Clinical Social Work interventions: Resource education Pt advocacy  Loren Racer, [place] Tuesdays 8:30-1pm Wednesdays 8:30-12pm  [phone]

## 2014-11-19 NOTE — Assessment & Plan Note (Addendum)
Needing aggressive anti-osteoporotic intervention with Ca++, Vit D, and Prolia.  Supportive therapy plan built for Prolia every 6 months.

## 2014-11-20 ENCOUNTER — Encounter (HOSPITAL_COMMUNITY): Payer: Self-pay | Admitting: *Deleted

## 2014-11-20 ENCOUNTER — Inpatient Hospital Stay (HOSPITAL_COMMUNITY)
Admission: EM | Admit: 2014-11-20 | Discharge: 2014-11-25 | DRG: 292 | Disposition: A | Discharge status: Skilled Nursing Facility | Payer: Medicare HMO | Attending: Family Medicine | Admitting: Family Medicine

## 2014-11-20 ENCOUNTER — Emergency Department (HOSPITAL_COMMUNITY): Payer: Medicare HMO

## 2014-11-20 ENCOUNTER — Other Ambulatory Visit (HOSPITAL_COMMUNITY): Payer: Commercial Managed Care - HMO

## 2014-11-20 DIAGNOSIS — Z7982 Long term (current) use of aspirin: Secondary | ICD-10-CM

## 2014-11-20 DIAGNOSIS — M810 Age-related osteoporosis without current pathological fracture: Secondary | ICD-10-CM | POA: Diagnosis present

## 2014-11-20 DIAGNOSIS — I248 Other forms of acute ischemic heart disease: Secondary | ICD-10-CM | POA: Diagnosis present

## 2014-11-20 DIAGNOSIS — I5021 Acute systolic (congestive) heart failure: Secondary | ICD-10-CM

## 2014-11-20 DIAGNOSIS — R0902 Hypoxemia: Secondary | ICD-10-CM | POA: Diagnosis present

## 2014-11-20 DIAGNOSIS — M1990 Unspecified osteoarthritis, unspecified site: Secondary | ICD-10-CM | POA: Diagnosis present

## 2014-11-20 DIAGNOSIS — I5043 Acute on chronic combined systolic (congestive) and diastolic (congestive) heart failure: Secondary | ICD-10-CM | POA: Diagnosis not present

## 2014-11-20 DIAGNOSIS — Z7952 Long term (current) use of systemic steroids: Secondary | ICD-10-CM

## 2014-11-20 DIAGNOSIS — I5022 Chronic systolic (congestive) heart failure: Secondary | ICD-10-CM

## 2014-11-20 DIAGNOSIS — I429 Cardiomyopathy, unspecified: Secondary | ICD-10-CM | POA: Diagnosis present

## 2014-11-20 DIAGNOSIS — Z79891 Long term (current) use of opiate analgesic: Secondary | ICD-10-CM | POA: Diagnosis not present

## 2014-11-20 DIAGNOSIS — Z794 Long term (current) use of insulin: Secondary | ICD-10-CM

## 2014-11-20 DIAGNOSIS — E1142 Type 2 diabetes mellitus with diabetic polyneuropathy: Secondary | ICD-10-CM | POA: Diagnosis present

## 2014-11-20 DIAGNOSIS — I10 Essential (primary) hypertension: Secondary | ICD-10-CM | POA: Diagnosis present

## 2014-11-20 DIAGNOSIS — I428 Other cardiomyopathies: Secondary | ICD-10-CM

## 2014-11-20 DIAGNOSIS — C61 Malignant neoplasm of prostate: Secondary | ICD-10-CM

## 2014-11-20 DIAGNOSIS — J449 Chronic obstructive pulmonary disease, unspecified: Secondary | ICD-10-CM | POA: Diagnosis present

## 2014-11-20 DIAGNOSIS — G8929 Other chronic pain: Secondary | ICD-10-CM | POA: Diagnosis present

## 2014-11-20 DIAGNOSIS — Z809 Family history of malignant neoplasm, unspecified: Secondary | ICD-10-CM

## 2014-11-20 DIAGNOSIS — S22080A Wedge compression fracture of T11-T12 vertebra, initial encounter for closed fracture: Secondary | ICD-10-CM

## 2014-11-20 DIAGNOSIS — I509 Heart failure, unspecified: Secondary | ICD-10-CM

## 2014-11-20 DIAGNOSIS — Z8546 Personal history of malignant neoplasm of prostate: Secondary | ICD-10-CM | POA: Diagnosis not present

## 2014-11-20 DIAGNOSIS — F419 Anxiety disorder, unspecified: Secondary | ICD-10-CM | POA: Diagnosis present

## 2014-11-20 DIAGNOSIS — R0602 Shortness of breath: Secondary | ICD-10-CM

## 2014-11-20 DIAGNOSIS — E782 Mixed hyperlipidemia: Secondary | ICD-10-CM | POA: Diagnosis present

## 2014-11-20 DIAGNOSIS — Z9119 Patient's noncompliance with other medical treatment and regimen: Secondary | ICD-10-CM | POA: Diagnosis present

## 2014-11-20 DIAGNOSIS — N1330 Unspecified hydronephrosis: Secondary | ICD-10-CM

## 2014-11-20 DIAGNOSIS — Z960 Presence of urogenital implants: Secondary | ICD-10-CM

## 2014-11-20 DIAGNOSIS — E118 Type 2 diabetes mellitus with unspecified complications: Secondary | ICD-10-CM

## 2014-11-20 HISTORY — DX: Essential (primary) hypertension: I10

## 2014-11-20 HISTORY — DX: Type 2 diabetes mellitus with diabetic polyneuropathy: E11.42

## 2014-11-20 HISTORY — DX: Mixed hyperlipidemia: E78.2

## 2014-11-20 HISTORY — DX: Other cardiomyopathies: I42.8

## 2014-11-20 LAB — CBC WITH DIFFERENTIAL/PLATELET
Basophils Absolute: 0 10*3/uL (ref 0.0–0.1)
Basophils Relative: 1 % (ref 0–1)
EOS ABS: 0.1 10*3/uL (ref 0.0–0.7)
Eosinophils Relative: 4 % (ref 0–5)
HCT: 34.2 % — ABNORMAL LOW (ref 39.0–52.0)
HEMOGLOBIN: 10.5 g/dL — AB (ref 13.0–17.0)
LYMPHS ABS: 0.8 10*3/uL (ref 0.7–4.0)
LYMPHS PCT: 20 % (ref 12–46)
MCH: 25.4 pg — ABNORMAL LOW (ref 26.0–34.0)
MCHC: 30.7 g/dL (ref 30.0–36.0)
MCV: 82.8 fL (ref 78.0–100.0)
MONO ABS: 0.4 10*3/uL (ref 0.1–1.0)
MONOS PCT: 10 % (ref 3–12)
NEUTROS PCT: 65 % (ref 43–77)
Neutro Abs: 2.6 10*3/uL (ref 1.7–7.7)
Platelets: 225 10*3/uL (ref 150–400)
RBC: 4.13 MIL/uL — AB (ref 4.22–5.81)
RDW: 18.8 % — ABNORMAL HIGH (ref 11.5–15.5)
WBC: 3.9 10*3/uL — ABNORMAL LOW (ref 4.0–10.5)

## 2014-11-20 LAB — COMPREHENSIVE METABOLIC PANEL
ALT: 14 U/L (ref 0–53)
AST: 18 U/L (ref 0–37)
Albumin: 3 g/dL — ABNORMAL LOW (ref 3.5–5.2)
Alkaline Phosphatase: 127 U/L — ABNORMAL HIGH (ref 39–117)
Anion gap: 6 (ref 5–15)
BUN: 14 mg/dL (ref 6–23)
CALCIUM: 8.8 mg/dL (ref 8.4–10.5)
CO2: 30 mmol/L (ref 19–32)
CREATININE: 0.83 mg/dL (ref 0.50–1.35)
Chloride: 103 mEq/L (ref 96–112)
GFR calc non Af Amer: 86 mL/min — ABNORMAL LOW (ref >90)
GLUCOSE: 179 mg/dL — AB (ref 70–99)
Potassium: 3.8 mmol/L (ref 3.5–5.1)
Sodium: 139 mmol/L (ref 135–145)
TOTAL PROTEIN: 6.3 g/dL (ref 6.0–8.3)
Total Bilirubin: 0.7 mg/dL (ref 0.3–1.2)

## 2014-11-20 LAB — URINALYSIS, ROUTINE W REFLEX MICROSCOPIC
BILIRUBIN URINE: NEGATIVE
GLUCOSE, UA: NEGATIVE mg/dL
Ketones, ur: NEGATIVE mg/dL
Leukocytes, UA: NEGATIVE
Nitrite: NEGATIVE
PROTEIN: NEGATIVE mg/dL
SPECIFIC GRAVITY, URINE: 1.01 (ref 1.005–1.030)
UROBILINOGEN UA: 0.2 mg/dL (ref 0.0–1.0)
pH: 5.5 (ref 5.0–8.0)

## 2014-11-20 LAB — URINE MICROSCOPIC-ADD ON

## 2014-11-20 LAB — BRAIN NATRIURETIC PEPTIDE: B Natriuretic Peptide: 2653 pg/mL — ABNORMAL HIGH (ref 0.0–100.0)

## 2014-11-20 LAB — TROPONIN I: Troponin I: 0.14 ng/mL — ABNORMAL HIGH (ref <0.031)

## 2014-11-20 MED ORDER — FUROSEMIDE 10 MG/ML IJ SOLN
40.0000 mg | INTRAMUSCULAR | Status: AC
  Administered 2014-11-20: 40 mg via INTRAVENOUS
  Filled 2014-11-20: qty 4

## 2014-11-20 MED ORDER — ENOXAPARIN SODIUM 40 MG/0.4ML ~~LOC~~ SOLN
40.0000 mg | SUBCUTANEOUS | Status: DC
  Administered 2014-11-20 – 2014-11-24 (×5): 40 mg via SUBCUTANEOUS
  Filled 2014-11-20 (×5): qty 0.4

## 2014-11-20 MED ORDER — ENZALUTAMIDE 40 MG PO CAPS
160.0000 mg | ORAL_CAPSULE | Freq: Every day | ORAL | Status: DC
Start: 2014-11-21 — End: 2014-11-25
  Administered 2014-11-22 – 2014-11-25 (×4): 160 mg via ORAL

## 2014-11-20 MED ORDER — ACETAMINOPHEN 650 MG RE SUPP: 650.0000 mg | Freq: Four times a day (QID) | RECTAL | Status: DC | PRN

## 2014-11-20 MED ORDER — FENTANYL 25 MCG/HR TD PT72
25.0000 ug | MEDICATED_PATCH | TRANSDERMAL | Status: DC
  Administered 2014-11-21 – 2014-11-24 (×2): 25 ug via TRANSDERMAL
  Filled 2014-11-20 (×2): qty 1

## 2014-11-20 MED ORDER — ISOSORBIDE DINITRATE 20 MG PO TABS
20.0000 mg | ORAL_TABLET | Freq: Three times a day (TID) | ORAL | Status: DC
  Administered 2014-11-20 – 2014-11-25 (×15): 20 mg via ORAL
  Filled 2014-11-20 (×15): qty 1

## 2014-11-20 MED ORDER — ASPIRIN EC 81 MG PO TBEC
81.0000 mg | DELAYED_RELEASE_TABLET | Freq: Every day | ORAL | Status: DC
  Administered 2014-11-21 – 2014-11-25 (×5): 81 mg via ORAL
  Filled 2014-11-20 (×5): qty 1

## 2014-11-20 MED ORDER — PREDNISONE 10 MG PO TABS
5.0000 mg | ORAL_TABLET | Freq: Two times a day (BID) | ORAL | Status: DC
  Administered 2014-11-21 – 2014-11-25 (×9): 5 mg via ORAL
  Filled 2014-11-20 (×9): qty 1

## 2014-11-20 MED ORDER — INSULIN ASPART 100 UNIT/ML ~~LOC~~ SOLN
0.0000 [IU] | Freq: Three times a day (TID) | SUBCUTANEOUS | Status: DC
  Administered 2014-11-21: 1 [IU] via SUBCUTANEOUS
  Administered 2014-11-22: 2 [IU] via SUBCUTANEOUS
  Administered 2014-11-22: 1 [IU] via SUBCUTANEOUS
  Administered 2014-11-23 – 2014-11-24 (×2): 2 [IU] via SUBCUTANEOUS
  Administered 2014-11-24: 1 [IU] via SUBCUTANEOUS
  Administered 2014-11-25: 2 [IU] via SUBCUTANEOUS

## 2014-11-20 MED ORDER — CLONIDINE HCL 0.2 MG PO TABS
0.2000 mg | ORAL_TABLET | Freq: Two times a day (BID) | ORAL | Status: DC
  Administered 2014-11-20 – 2014-11-21 (×2): 0.2 mg via ORAL
  Filled 2014-11-20 (×2): qty 1

## 2014-11-20 MED ORDER — AMLODIPINE BESYLATE 5 MG PO TABS
5.0000 mg | ORAL_TABLET | Freq: Every day | ORAL | Status: DC
  Filled 2014-11-20: qty 1

## 2014-11-20 MED ORDER — ONDANSETRON HCL 4 MG/2ML IJ SOLN: 4.0000 mg | Freq: Four times a day (QID) | INTRAMUSCULAR | Status: DC | PRN

## 2014-11-20 MED ORDER — ALBUTEROL SULFATE (2.5 MG/3ML) 0.083% IN NEBU: 2.5000 mg | INHALATION_SOLUTION | RESPIRATORY_TRACT | Status: DC | PRN

## 2014-11-20 MED ORDER — ASPIRIN 81 MG PO CHEW
324.0000 mg | CHEWABLE_TABLET | Freq: Once | ORAL | Status: AC
  Administered 2014-11-20: 324 mg via ORAL
  Filled 2014-11-20: qty 4

## 2014-11-20 MED ORDER — CARVEDILOL 12.5 MG PO TABS
25.0000 mg | ORAL_TABLET | Freq: Two times a day (BID) | ORAL | Status: DC
  Administered 2014-11-21 – 2014-11-25 (×9): 25 mg via ORAL
  Filled 2014-11-20 (×9): qty 2

## 2014-11-20 MED ORDER — SODIUM CHLORIDE 0.9 % IJ SOLN: 3.0000 mL | INTRAMUSCULAR | Status: DC | PRN

## 2014-11-20 MED ORDER — POTASSIUM CHLORIDE CRYS ER 20 MEQ PO TBCR: 20.0000 meq | EXTENDED_RELEASE_TABLET | Freq: Every day | ORAL | Status: DC

## 2014-11-20 MED ORDER — ONDANSETRON HCL 4 MG PO TABS
4.0000 mg | ORAL_TABLET | Freq: Four times a day (QID) | ORAL | Status: DC | PRN
Start: 2014-11-20 — End: 2014-11-25

## 2014-11-20 MED ORDER — SODIUM CHLORIDE 0.9 % IV SOLN: 250.0000 mL | INTRAVENOUS | Status: DC | PRN

## 2014-11-20 MED ORDER — ACETAMINOPHEN 325 MG PO TABS
650.0000 mg | ORAL_TABLET | Freq: Three times a day (TID) | ORAL | Status: DC | PRN
Start: 2014-11-20 — End: 2014-11-20

## 2014-11-20 MED ORDER — FUROSEMIDE 10 MG/ML IJ SOLN
20.0000 mg | Freq: Two times a day (BID) | INTRAMUSCULAR | Status: DC
  Administered 2014-11-20 – 2014-11-21 (×3): 20 mg via INTRAVENOUS
  Filled 2014-11-20 (×3): qty 2

## 2014-11-20 MED ORDER — SODIUM CHLORIDE 0.9 % IJ SOLN
3.0000 mL | Freq: Two times a day (BID) | INTRAMUSCULAR | Status: DC
  Administered 2014-11-20 – 2014-11-24 (×9): 3 mL via INTRAVENOUS

## 2014-11-20 MED ORDER — TAMSULOSIN HCL 0.4 MG PO CAPS
0.4000 mg | ORAL_CAPSULE | Freq: Every day | ORAL | Status: DC
  Administered 2014-11-21 – 2014-11-25 (×5): 0.4 mg via ORAL
  Filled 2014-11-20 (×5): qty 1

## 2014-11-20 MED ORDER — ACETAMINOPHEN 325 MG PO TABS
650.0000 mg | ORAL_TABLET | Freq: Four times a day (QID) | ORAL | Status: DC | PRN
  Filled 2014-11-20: qty 2

## 2014-11-20 MED ORDER — INSULIN GLARGINE 100 UNIT/ML ~~LOC~~ SOLN
24.0000 [IU] | Freq: Every day | SUBCUTANEOUS | Status: DC
  Administered 2014-11-22 – 2014-11-25 (×4): 24 [IU] via SUBCUTANEOUS
  Filled 2014-11-20 (×5): qty 0.24

## 2014-11-20 NOTE — ED Provider Notes (Signed)
CSN: [id number]     Arrival date & time 11/20/14  1812 History  This chart was scribed for Oscar Acosta, MD by Tula Nakayama, ED Scribe. This patient was seen in room APA02/APA02 and the patient's care was started at 6:30 PM.    Chief Complaint  Patient presents with  . Fatigue   The history is provided by the spouse. No language interpreter was used.    HPI Comments: Oscar Hickman is a 74 y.o. male brought in by his wife who presents to the Emergency Department complaining of gradual onset, persistent shortness of breath. She states bilateral leg swelling as an associated symptom. Pt's wife states he has chronic back pain and had an MRI 1 week ago at the cancer center which showed arthritis, but was negative for cancer. His wife reports bruising on his left forearm and bilateral leg swelling were present at evaluation 1 week ago. Pt has a history of CHF with an ef of 20-25%, nonischemic cardiomyopathy and adenocarcinoma of the prostate. He was evaluated and admitted to the hospital in November 2015 after he was diagnosed with UTI and right-sided hydronephrosis. Pt had right ureteral stent for over 1 year. He was transferred to [facility] and seen by urology who recommended outpatient change of the stent. The stent has not been changed, but pt has an appointment with a new urologist in February. Pt is a known diabetic and hypertensive. Pt's wife denies fever, cough and vomiting as associated symptoms.  Past Medical History  Diagnosis Date  . Skin infection     history  . HTN (hypertension) 11/23/2011  . High cholesterol   . Diabetes mellitus   . Chronic headache     daily  . Anxiety   . Osteoarthritis   . COPD (chronic obstructive pulmonary disease)     patient denies  . Peripheral neuropathy   . 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 CHF (congestive heart failure)   . Medical non-compliance   . Heart disease   . Cognitive dysfunction   .  Back pain   . Traumatic compression fracture of T11 thoracic vertebra 11/08/2014  . Osteoporosis 11/19/2014   Past Surgical History  Procedure Laterality Date  . Transurethral resection of prostate  03/2008  . Cardiac catheterization  02/2009    minimal non-obstructive CAD  . Cystoscopy w/ ureteral stent placement Right 04/06/2013    Procedure: CYSTOSCOPY WITH RETROGRADE PYELOGRAM/URETERAL STENT PLACEMENT;  Surgeon: Marissa Nestle, MD;  Location: [facility];  Service: Urology;  Laterality: Right;  . Cystoscopy w/ ureteral stent removal Right 04/06/2013    Procedure: CYSTOSCOPY WITH STENT REMOVAL;  Surgeon: Marissa Nestle, MD;  Location: [facility];  Service: Urology;  Laterality: Right;   Family History  Problem Relation Age of Onset  . Cancer Mother   . Cancer Father    History  Substance Use Topics  . Smoking status: Never Smoker   . Smokeless tobacco: Never Used  . Alcohol Use: No    Review of Systems  Constitutional: Negative for fever.  Respiratory: Positive for shortness of breath. Negative for cough.   Cardiovascular: Positive for leg swelling.  Gastrointestinal: Negative for nausea and vomiting.  Musculoskeletal: Positive for back pain and arthralgias.  Skin: Positive for wound.  All other systems reviewed and are negative.  Allergies  Review of patient's allergies indicates no known allergies.  Home Medications   Prior to Admission medications   Medication Sig  Start Date End Date Taking? Authorizing Provider  amLODipine (NORVASC) 5 MG tablet Take 1 tablet (5 mg total) by mouth daily. 05/23/14  Yes [name], MD  aspirin EC 81 MG tablet Take 81 mg by mouth daily.   Yes Historical Provider, MD  calcium-vitamin D (OSCAL WITH D) 500-200 MG-UNIT per tablet Take 2 tablets by mouth daily with breakfast. 11/11/14  Yes Baird Cancer, PA-C  carvedilol (COREG) 25 MG tablet Take 25 mg by mouth 2 (two) times daily with a meal.   Yes Historical Provider, MD  cloNIDine  (CATAPRES) 0.2 MG tablet Take 1 tablet (0.2 mg total) by mouth 2 (two) times daily. 11/17/13  Yes Richard Marcia Brash, MD  ENSURE PLUS (ENSURE PLUS) LIQD Take 237 mLs by mouth daily.   Yes Historical Provider, MD  enzalutamide Gillermina Phy) 40 MG capsule Take 4 capsules (160 mg total) by mouth daily. 11/11/14  Yes Manon Hilding [name], PA-C  furosemide (LASIX) 20 MG tablet Take 2 tablets (40 mg total) by mouth daily. 11/15/14  Yes Manon Hilding [name], PA-C  Insulin Glargine (LANTUS) 100 UNIT/ML Solostar Pen Inject 24-25 Units into the skin daily.   Yes Historical Provider, MD  isosorbide dinitrate (ISORDIL) 20 MG tablet Take 20 mg by mouth 3 (three) times daily.  06/18/11  Yes Historical Provider, MD  metFORMIN (GLUCOPHAGE) 500 MG tablet Take 500 mg by mouth 2 (two) times daily with a meal.    Yes Historical Provider, MD  oxyCODONE-acetaminophen (PERCOCET/ROXICET) 5-325 MG per tablet Take 1 tablet by mouth every 4 (four) hours as needed for severe pain. 11/11/14  Yes Manon Hilding [name], PA-C  potassium chloride SA (K-DUR,KLOR-CON) 20 MEQ tablet Take 20 mEq by mouth daily.   Yes Historical Provider, MD  predniSONE (DELTASONE) 5 MG tablet Take 1 tablet (5 mg total) by mouth 2 (two) times daily with a meal. 10/17/14  Yes Manon Hilding [name], PA-C  senna-docusate (SENOKOT-S) 8.6-50 MG per tablet Take 1 tablet by mouth at bedtime as needed for mild constipation. 09/25/14  Yes Maryann Mikhail, DO  tamsulosin (FLOMAX) 0.4 MG CAPS capsule Take 1 capsule (0.4 mg total) by mouth daily. 11/17/13  Yes [name], MD  abiraterone Acetate (ZYTIGA) 250 MG tablet Take 4 tablets (1,000 mg total) by mouth daily. Take on an empty stomach 1 hour before or 2 hours after a meal Patient not taking: Reported on 11/20/2014 10/17/14   Baird Cancer, PA-C  acetaminophen (TYLENOL) 650 MG CR tablet Take 650 mg by mouth every 8 (eight) hours as needed for pain. Takes 2 for headache occasionally    Historical Provider, MD  albuterol (PROVENTIL)  (2.5 MG/3ML) 0.083% nebulizer solution Take 3 mLs (2.5 mg total) by nebulization every 2 (two) hours as needed for wheezing or shortness of breath. 11/29/13   [name], MD  fentaNYL (DURAGESIC - DOSED MCG/HR) 25 MCG/HR patch Place 1 patch (25 mcg total) onto the skin every 3 (three) days. Patient taking differently: Place 75 mcg onto the skin every 3 (three) days.  11/11/14   Baird Cancer, PA-C  ibuprofen (ADVIL,MOTRIN) 200 MG tablet Take 400 mg by mouth every 6 (six) hours as needed for moderate pain.    Historical Provider, MD   BP 188/135 mmHg  Pulse 77  Temp(Src) 97.6 F (36.4 C) (Oral)  Resp 21  Ht 5\' 9"  (1.753 m)  Wt 180 lb (81.647 kg)  BMI 26.57 kg/m2  SpO2 97% Physical Exam  Constitutional: He appears well-developed  and well-nourished. No distress.  HENT:  Head: Normocephalic and atraumatic.  Mouth/Throat: Oropharynx is clear and moist. No oropharyngeal exudate.  Eyes: Conjunctivae and EOM are normal. Pupils are equal, round, and reactive to light. Right eye exhibits no discharge. Left eye exhibits no discharge. No scleral icterus.  Neck: Normal range of motion. Neck supple. No JVD present. No thyromegaly present.  Cardiovascular: Normal rate, regular rhythm, normal heart sounds and intact distal pulses.  Exam reveals no gallop and no friction rub.   No murmur heard. Pulmonary/Chest: Effort normal and breath sounds normal. No respiratory distress. He has no wheezes. He has no rales.  Mildly tachypnic; rales at the bases  Abdominal: Soft. Bowel sounds are normal. He exhibits no distension and no mass. There is no tenderness.  Abdomen mildly distended, but non-tender; normal bowel sounds  Musculoskeletal: Normal range of motion. He exhibits no edema or tenderness.  2+ bilateral symmetrical pitting edema  Lymphadenopathy:    He has no cervical adenopathy.  Neurological: He is alert. Coordination normal.  Skin: Skin is warm and dry. No rash noted. No erythema.   Bruising on volar surface of left forearm   Psychiatric: He has a normal mood and affect. His behavior is normal.  Nursing note and vitals reviewed.   ED Course  Procedures (including critical care time) DIAGNOSTIC STUDIES: Oxygen Saturation is 100% on RA, normal by my interpretation.    COORDINATION OF CARE: 6:44 PM Discussed treatment plan with pt at bedside and pt agreed to plan.  Labs Review Labs Reviewed  BRAIN NATRIURETIC PEPTIDE - Abnormal; Notable for the following:    B Natriuretic Peptide 2653.0 (*)    All other components within normal limits  TROPONIN I - Abnormal; Notable for the following:    Troponin I 0.14 (*)    All other components within normal limits  CBC WITH DIFFERENTIAL - Abnormal; Notable for the following:    WBC 3.9 (*)    RBC 4.13 (*)    Hemoglobin 10.5 (*)    HCT 34.2 (*)    MCH 25.4 (*)    RDW 18.8 (*)    All other components within normal limits  COMPREHENSIVE METABOLIC PANEL - Abnormal; Notable for the following:    Glucose, Bld 179 (*)    Albumin 3.0 (*)    Alkaline Phosphatase 127 (*)    GFR calc non Af Amer 86 (*)    All other components within normal limits  URINALYSIS, ROUTINE W REFLEX MICROSCOPIC - Abnormal; Notable for the following:    Color, Urine STRAW (*)    Hgb urine dipstick MODERATE (*)    All other components within normal limits  URINE MICROSCOPIC-ADD ON    Imaging Review Dg Chest Port 1 View  11/20/2014   CLINICAL DATA:  Shortness of breath, bilateral leg edema.  EXAM: PORTABLE CHEST - 1 VIEW  COMPARISON:  Right rib series 11/11/2014, frontal and lateral chest radiographs 09/21/2014 .  FINDINGS: Lower lung volumes from prior exam. The heart remains enlarged. There is tortuosity and atherosclerosis of the thoracic aorta. Hilar prominence is similar to prior exam. Vascular congestion again seen, likely accentuated by technique. No confluent airspace disease. No large pleural effusion. No pneumothorax.  IMPRESSION:  Hypoventilatory chest. Question worsening vascular congestion versus differences in technique. Cardiomegaly again seen.   Electronically Signed   By: Jeb Levering M.D.   On: 11/20/2014 19:16     EKG Interpretation   Date/Time:  Wednesday November 20 2014 18:54:57 EST Ventricular  Rate:  79 PR Interval:  180 QRS Duration: 113 QT Interval:  432 QTC Calculation: 495 R Axis:   -101 Text Interpretation:  Sinus rhythm Ventricular premature complex  Borderline intraventricular conduction delay Nonspecific T abnormalities,  lateral leads Borderline prolonged QT interval Abnormal ekg since last  tracing no significant change Confirmed by Sabra Heck  MD, Idaly Verret ([id number]) on  11/20/2014 7:25:26 PM      MDM   Final diagnoses:  SOB (shortness of breath)  Acute systolic congestive heart failure    The patient is ill-appearing with significant fluid overload, he is hypertensive, has some hypoxia and tachypnea related to his pulmonary edema, his BNP is significantly elevated, his troponin is slightly elevated, he has required diuresis, supplemental oxygen, I discussed his care with both the internal medicine hospitalist, Dr. Darrick Meigs, as well as with the cardiologist on call who agrees that the patient needs to be aggressively diuresed. I would consider putting the patient on a nitroglycerin drip secondary to his significant hypertension. The patient is critically else acute congestive heart failure and fluid overload.  CRITICAL CARE Performed by: Oscar Hickman Total critical care time: 35 Critical care time was exclusive of separately billable procedures and treating other patients. Critical care was necessary to treat or prevent imminent or life-threatening deterioration. Critical care was time spent personally by me on the following activities: development of treatment plan with patient and/or surrogate as well as nursing, discussions with consultants, evaluation of patient's response to treatment,  examination of patient, obtaining history from patient or surrogate, ordering and performing treatments and interventions, ordering and review of laboratory studies, ordering and review of radiographic studies, pulse oximetry and re-evaluation of patient's condition.   Oscar Acosta, MD 11/20/14 [phone]

## 2014-11-20 NOTE — H&P (Signed)
PCP:   [name], MD   Chief Complaint:  Fatigue  HPI: 74 year old male who   has a past medical history of Skin infection; HTN (hypertension) (11/23/2011); High cholesterol; Diabetes mellitus; Chronic headache; Anxiety; Osteoarthritis; COPD (chronic obstructive pulmonary disease); Peripheral neuropathy; Prostate cancer (2009); CHF (congestive heart failure); Medical non-compliance; Heart disease; Cognitive dysfunction; Back pain; Traumatic compression fracture of T11 thoracic vertebra (11/08/2014); and Osteoporosis (11/19/2014).  Today came to the hospital beginning of gradual onset generalized weakness, fatigue progressive shortness of breath. Patient has a history of prostate cancer and has been complaining of back pain. He recently underwent MRI of the lumbar spine on 11/15/2014 which showed no evidence of regional metastatic disease, showed acute/subacute compression fractures at T11-T12 and L2, benign osteoporotic fractures. Patient also has a retained ureteral stent for over one year, he was diagnosed with UTI and right-sided hydronephrosis. Patient has an appointment to see urologist as outpatient. 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 Cardiology was consulted by the ED physician Dr. Noemi Chapel, who recommended diuresis. Patient also has admitted troponin 0.14 in the ED. Patient admits to having shortness of breath on exertion, he is a poor historian, though denies chest pain no nausea vomiting or diarrhea. No abdominal pain. No dysuria urgency or frequency of urination.  Allergies:  No Known Allergies    Past Medical History  Diagnosis Date  . Skin infection     history  . HTN (hypertension) 11/23/2011  . High cholesterol   . Diabetes mellitus   . Chronic headache     daily  . Anxiety   . Osteoarthritis   . COPD (chronic obstructive pulmonary disease)     patient denies  . Peripheral  neuropathy   . 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 CHF (congestive heart failure)   . Medical non-compliance   . Heart disease   . Cognitive dysfunction   . Back pain   . Traumatic compression fracture of T11 thoracic vertebra 11/08/2014  . Osteoporosis 11/19/2014    Past Surgical History  Procedure Laterality Date  . Transurethral resection of prostate  03/2008  . Cardiac catheterization  02/2009    minimal non-obstructive CAD  . Cystoscopy w/ ureteral stent placement Right 04/06/2013    Procedure: CYSTOSCOPY WITH RETROGRADE PYELOGRAM/URETERAL STENT PLACEMENT;  Surgeon: Marissa Nestle, MD;  Location: [facility];  Service: Urology;  Laterality: Right;  . Cystoscopy w/ ureteral stent removal Right 04/06/2013    Procedure: CYSTOSCOPY WITH STENT REMOVAL;  Surgeon: Marissa Nestle, MD;  Location: [facility];  Service: Urology;  Laterality: Right;    Prior to Admission medications   Medication Sig Start Date End Date Taking? Authorizing Provider  amLODipine (NORVASC) 5 MG tablet Take 1 tablet (5 mg total) by mouth daily. 05/23/14  Yes [name], MD  aspirin EC 81 MG tablet Take 81 mg by mouth daily.   Yes Historical Provider, MD  calcium-vitamin D (OSCAL WITH D) 500-200 MG-UNIT per tablet Take 2 tablets by mouth daily with breakfast. 11/11/14  Yes Baird Cancer, PA-C  carvedilol (COREG) 25 MG tablet Take 25 mg by mouth 2 (two) times daily with a meal.   Yes Historical Provider, MD  cloNIDine (CATAPRES) 0.2 MG tablet Take 1 tablet (0.2 mg total) by mouth 2 (two) times daily. 11/17/13  Yes [name], MD  ENSURE PLUS (ENSURE PLUS) LIQD Take  237 mLs by mouth daily.   Yes Historical Provider, MD  enzalutamide Gillermina Phy) 40 MG capsule Take 4 capsules (160 mg total) by mouth daily. 11/11/14  Yes Manon Hilding [name], PA-C  furosemide (LASIX) 20 MG tablet Take 2 tablets (40 mg total) by mouth daily. 11/15/14  Yes Manon Hilding [name], PA-C  Insulin Glargine (LANTUS) 100  UNIT/ML Solostar Pen Inject 24-25 Units into the skin daily.   Yes Historical Provider, MD  isosorbide dinitrate (ISORDIL) 20 MG tablet Take 20 mg by mouth 3 (three) times daily.  06/18/11  Yes Historical Provider, MD  metFORMIN (GLUCOPHAGE) 500 MG tablet Take 500 mg by mouth 2 (two) times daily with a meal.    Yes Historical Provider, MD  oxyCODONE-acetaminophen (PERCOCET/ROXICET) 5-325 MG per tablet Take 1 tablet by mouth every 4 (four) hours as needed for severe pain. 11/11/14  Yes Manon Hilding [name], PA-C  potassium chloride SA (K-DUR,KLOR-CON) 20 MEQ tablet Take 20 mEq by mouth daily.   Yes Historical Provider, MD  predniSONE (DELTASONE) 5 MG tablet Take 1 tablet (5 mg total) by mouth 2 (two) times daily with a meal. 10/17/14  Yes Manon Hilding [name], PA-C  senna-docusate (SENOKOT-S) 8.6-50 MG per tablet Take 1 tablet by mouth at bedtime as needed for mild constipation. 09/25/14  Yes Maryann Mikhail, DO  tamsulosin (FLOMAX) 0.4 MG CAPS capsule Take 1 capsule (0.4 mg total) by mouth daily. 11/17/13  Yes [name], MD  abiraterone Acetate (ZYTIGA) 250 MG tablet Take 4 tablets (1,000 mg total) by mouth daily. Take on an empty stomach 1 hour before or 2 hours after a meal Patient not taking: Reported on 11/20/2014 10/17/14   Baird Cancer, PA-C  acetaminophen (TYLENOL) 650 MG CR tablet Take 650 mg by mouth every 8 (eight) hours as needed for pain. Takes 2 for headache occasionally    Historical Provider, MD  albuterol (PROVENTIL) (2.5 MG/3ML) 0.083% nebulizer solution Take 3 mLs (2.5 mg total) by nebulization every 2 (two) hours as needed for wheezing or shortness of breath. 11/29/13   [name], MD  fentaNYL (DURAGESIC - DOSED MCG/HR) 25 MCG/HR patch Place 1 patch (25 mcg total) onto the skin every 3 (three) days. Patient taking differently: Place 75 mcg onto the skin every 3 (three) days.  11/11/14   Baird Cancer, PA-C  ibuprofen (ADVIL,MOTRIN) 200 MG tablet Take 400 mg by mouth every 6  (six) hours as needed for moderate pain.    Historical Provider, MD    Social History:  reports that he has never smoked. He has never used smokeless tobacco. He reports that he does not drink alcohol or use illicit drugs.  Family History  Problem Relation Age of Onset  . Cancer Mother   . Cancer Father      All the positives are listed in BOLD  Review of Systems:  HEENT: Headache, blurred vision, runny nose, sore throat Neck: Hypothyroidism, hyperthyroidism,,lymphadenopathy Chest : Shortness of breath, history of COPD, Asthma Heart : Chest pain, history of coronary arterey disease GI:  Nausea, vomiting, diarrhea, constipation, GERD GU: Dysuria, urgency, frequency of urination, hematuria Neuro: Stroke, seizures, syncope Psych: Depression, anxiety, hallucinations   Physical Exam: Blood pressure 188/135, pulse 77, temperature 97.6 F (36.4 C), temperature source Oral, resp. rate 21, height 5\' 9"  (1.753 m), weight 81.647 kg (180 lb), SpO2 97 %. Constitutional:   Patient is a well-developed and well-nourished male in no acute distress and cooperative with exam. Head: Normocephalic and atraumatic Mouth:  Mucus membranes moist Eyes: PERRL, EOMI, conjunctivae normal Neck: Supple, No Thyromegaly Cardiovascular: RRR, S1 normal, S2 normal Pulmonary/Chest: Bilateral crackles auscultated Abdominal: Soft. Non-tender, non-distended, bowel sounds are normal, no masses, organomegaly, or guarding present.  Neurological: A&O x3, Strength is normal and symmetric bilaterally, cranial nerve II-XII are grossly intact, no focal motor deficit, sensory intact to light touch bilaterally.  Extremities : No Cyanosis, Clubbing. Bilateral 2+ pitting edema.  Labs on Admission:  Basic Metabolic Panel:  Recent Labs Lab 11/20/14 1919  NA 139  K 3.8  CL 103  CO2 30  GLUCOSE 179*  BUN 14  CREATININE 0.83  CALCIUM 8.8   Liver Function Tests:  Recent Labs Lab 11/20/14 1919  AST 18  ALT 14    ALKPHOS 127*  BILITOT 0.7  PROT 6.3  ALBUMIN 3.0*   No results for input(s): LIPASE, AMYLASE in the last 168 hours. No results for input(s): AMMONIA in the last 168 hours. CBC:  Recent Labs Lab 11/20/14 1919  WBC 3.9*  NEUTROABS 2.6  HGB 10.5*  HCT 34.2*  MCV 82.8  PLT 225   Cardiac Enzymes:  Recent Labs Lab 11/20/14 1919  TROPONINI 0.14*    BNP (last 3 results)  Recent Labs  11/27/13 0554 05/19/14 0845 07/02/14 1326  PROBNP 1323.0* 7039.0* 5754.0*   CBG: No results for input(s): GLUCAP in the last 168 hours.  Radiological Exams on Admission: Dg Chest Port 1 View  11/20/2014   CLINICAL DATA:  Shortness of breath, bilateral leg edema.  EXAM: PORTABLE CHEST - 1 VIEW  COMPARISON:  Right rib series 11/11/2014, frontal and lateral chest radiographs 09/21/2014 .  FINDINGS: Lower lung volumes from prior exam. The heart remains enlarged. There is tortuosity and atherosclerosis of the thoracic aorta. Hilar prominence is similar to prior exam. Vascular congestion again seen, likely accentuated by technique. No confluent airspace disease. No large pleural effusion. No pneumothorax.  IMPRESSION: Hypoventilatory chest. Question worsening vascular congestion versus differences in technique. Cardiomegaly again seen.   Electronically Signed   By: Jeb Levering M.D.   On: 11/20/2014 19:16    EKG: Independently reviewed. Sinus rhythm, nonspecific T wave abnormalities   Assessment/Plan Active Problems:   ADENOCARCINOMA, PROSTATE   DM type 2 causing complication   HTN (hypertension)   Cardiomyopathy, nonischemic   Accelerated hypertension   Retained ureteral stent   CHF exacerbation  CHF exacerbation We'll admit the patient to stepdown unit, Lasix 40 mg IV has been ordered by the ED physician. Will order Lasix 20 mg IV every 12 hours starting from tomorrow morning. Will check I's and O's. Consult to cardiology in a.m.  Elevated troponin Patient has mild elevation of  troponin 0.14, cardiology was called by the ED physician, and they recommend to diurese the patient as most likely it is due to demand ischemia from the CHF exacerbation and accelerated hypertension.  Accelerated hypertension Continue home medications including Catapres 0.2 mg twice a day, Coreg 25 twice a day.   Adenocarcinoma of the prostate Continue Extandi, prednisone 5 mg by mouth daily  Retained ureteral stent Patient has retained ureteral stent for more than a year, has outpatient appointment with urology.  Chronic back pain Patient had recent MRI of the lumbar spine which did not show any metastasis. But it showed subacute fractures of T11 T12 L2, will continue with fentanyl patch 25 g every 72 hours  Diabetes mellitus Hold metformin, continue Lantus 25 units at bedtime along with sliding scale insulin with NovoLog.  DVT  prophylaxis Lovenox   Code status: Full code  Family discussion: Admission, patients condition and plan of care including tests being ordered have been discussed with the patient and *his wife at bedside* who indicate understanding and agree with the plan and Code Status.   Time Spent on Admission: 60 minutes  [facility] Pager: [phone] 11/20/2014, 9:11 PM  If 7PM-7AM, please contact night-coverage  [email or web address]  Password [email or web address]

## 2014-11-20 NOTE — ED Notes (Signed)
Per EMS pt has started having multiple problems starting several days ago. Pt has chronic back pain since fall in December with pain moving into RLQ abdominal pain. Per EMS pt has some difficulty breathing and SOB starting the last few days. Bilateral edema noted to both legs. Wife states pt hasn't had an appetite in teh last month. Denies any problems with stool or urination. Denies N/V. NAD noted at this time.

## 2014-11-20 NOTE — ED Notes (Signed)
Patient resting in bed at this time, family at bedside. No needs voiced.

## 2014-11-21 ENCOUNTER — Encounter (HOSPITAL_COMMUNITY): Payer: Self-pay | Admitting: Cardiology

## 2014-11-21 DIAGNOSIS — I429 Cardiomyopathy, unspecified: Secondary | ICD-10-CM

## 2014-11-21 DIAGNOSIS — I509 Heart failure, unspecified: Secondary | ICD-10-CM

## 2014-11-21 DIAGNOSIS — I10 Essential (primary) hypertension: Secondary | ICD-10-CM

## 2014-11-21 LAB — CBC
HCT: 31.2 % — ABNORMAL LOW (ref 39.0–52.0)
Hemoglobin: 9.7 g/dL — ABNORMAL LOW (ref 13.0–17.0)
MCH: 25.6 pg — AB (ref 26.0–34.0)
MCHC: 31.1 g/dL (ref 30.0–36.0)
MCV: 82.3 fL (ref 78.0–100.0)
PLATELETS: 199 10*3/uL (ref 150–400)
RBC: 3.79 MIL/uL — ABNORMAL LOW (ref 4.22–5.81)
RDW: 18.4 % — ABNORMAL HIGH (ref 11.5–15.5)
WBC: 5.4 10*3/uL (ref 4.0–10.5)

## 2014-11-21 LAB — HEMOGLOBIN A1C
Hgb A1c MFr Bld: 8.3 % — ABNORMAL HIGH (ref <5.7)
MEAN PLASMA GLUCOSE: 192 mg/dL — AB (ref <117)

## 2014-11-21 LAB — COMPREHENSIVE METABOLIC PANEL
ALBUMIN: 2.6 g/dL — AB (ref 3.5–5.2)
ALK PHOS: 107 U/L (ref 39–117)
ALT: 12 U/L (ref 0–53)
AST: 16 U/L (ref 0–37)
Anion gap: 7 (ref 5–15)
BUN: 11 mg/dL (ref 6–23)
CHLORIDE: 102 meq/L (ref 96–112)
CO2: 31 mmol/L (ref 19–32)
CREATININE: 0.78 mg/dL (ref 0.50–1.35)
Calcium: 8.5 mg/dL (ref 8.4–10.5)
GFR, EST NON AFRICAN AMERICAN: 88 mL/min — AB (ref >90)
GLUCOSE: 110 mg/dL — AB (ref 70–99)
POTASSIUM: 2.8 mmol/L — AB (ref 3.5–5.1)
Sodium: 140 mmol/L (ref 135–145)
Total Bilirubin: 0.8 mg/dL (ref 0.3–1.2)
Total Protein: 5.3 g/dL — ABNORMAL LOW (ref 6.0–8.3)

## 2014-11-21 LAB — GLUCOSE, CAPILLARY
GLUCOSE-CAPILLARY: 129 mg/dL — AB (ref 70–99)
GLUCOSE-CAPILLARY: 192 mg/dL — AB (ref 70–99)
Glucose-Capillary: 135 mg/dL — ABNORMAL HIGH (ref 70–99)
Glucose-Capillary: 77 mg/dL (ref 70–99)

## 2014-11-21 LAB — TROPONIN I
TROPONIN I: 0.13 ng/mL — AB (ref <0.031)
TROPONIN I: 0.1 ng/mL — AB (ref <0.031)
Troponin I: 0.11 ng/mL — ABNORMAL HIGH (ref <0.031)

## 2014-11-21 MED ORDER — LISINOPRIL 5 MG PO TABS: 5.0000 mg | ORAL_TABLET | Freq: Every day | ORAL | Status: DC

## 2014-11-21 MED ORDER — POTASSIUM CHLORIDE 10 MEQ/100ML IV SOLN
10.0000 meq | INTRAVENOUS | Status: AC
Start: 2014-11-21 — End: 2014-11-21
  Administered 2014-11-21 (×2): 10 meq via INTRAVENOUS
  Filled 2014-11-21 (×2): qty 100

## 2014-11-21 MED ORDER — POTASSIUM CHLORIDE CRYS ER 20 MEQ PO TBCR
40.0000 meq | EXTENDED_RELEASE_TABLET | Freq: Every day | ORAL | Status: DC
  Administered 2014-11-21 – 2014-11-25 (×5): 40 meq via ORAL
  Filled 2014-11-21 (×5): qty 2

## 2014-11-21 MED ORDER — CLONIDINE HCL 0.1 MG PO TABS
0.1000 mg | ORAL_TABLET | Freq: Two times a day (BID) | ORAL | Status: DC
  Administered 2014-11-21 – 2014-11-22 (×2): 0.1 mg via ORAL
  Filled 2014-11-21 (×2): qty 1

## 2014-11-21 MED ORDER — POTASSIUM CHLORIDE 10 MEQ/100ML IV SOLN
10.0000 meq | Freq: Once | INTRAVENOUS | Status: AC
  Administered 2014-11-21: 10 meq via INTRAVENOUS

## 2014-11-21 MED ORDER — LISINOPRIL 5 MG PO TABS
2.5000 mg | ORAL_TABLET | Freq: Every day | ORAL | Status: DC
  Administered 2014-11-21: 2.5 mg via ORAL
  Filled 2014-11-21: qty 1

## 2014-11-21 MED ORDER — HYDROMORPHONE HCL 1 MG/ML IJ SOLN
1.0000 mg | INTRAMUSCULAR | Status: DC | PRN
  Administered 2014-11-24 (×2): 1 mg via INTRAVENOUS
  Filled 2014-11-21 (×2): qty 1

## 2014-11-21 MED ORDER — ALBUTEROL SULFATE (2.5 MG/3ML) 0.083% IN NEBU: 2.5000 mg | INHALATION_SOLUTION | RESPIRATORY_TRACT | Status: DC | PRN

## 2014-11-21 MED ORDER — SPIRONOLACTONE 25 MG PO TABS
12.5000 mg | ORAL_TABLET | Freq: Two times a day (BID) | ORAL | Status: DC
  Administered 2014-11-21 – 2014-11-25 (×9): 12.5 mg via ORAL
  Filled 2014-11-21 (×9): qty 1

## 2014-11-21 MED ORDER — ALBUTEROL SULFATE (2.5 MG/3ML) 0.083% IN NEBU
2.5000 mg | INHALATION_SOLUTION | Freq: Four times a day (QID) | RESPIRATORY_TRACT | Status: DC
Start: 2014-11-21 — End: 2014-11-21

## 2014-11-21 NOTE — Progress Notes (Signed)
Patient complains of mild dyspnea orthopnea no anginal chest pain FRANCHOT POLLITT KDT:[id number] DOB: 07-05-1941 DOA: 11/20/2014 PCP: [name], MD             Physical Exam: Blood pressure 145/98, pulse 80, temperature 98.3 F (36.8 C), temperature source Oral, resp. rate 20, height 5\' 9"  (1.753 m), weight 183 lb 4.8 oz (83.144 kg), SpO2 95 %. neck no JVD no carotid bruits no thyromegaly lungs diminished breath sounds in the bases no rales wheeze or rhonchi appreciable heart no S3 auscultated no heaves thrills rubs extremities 2-3+ chronic pitting edema   Investigations:  No results found for this or any previous visit (from the past 240 hour(s)).   Basic Metabolic Panel:  Recent Labs  11/20/14 1919 11/21/14 0422  NA 139 140  K 3.8 2.8*  CL 103 102  CO2 30 31  GLUCOSE 179* 110*  BUN 14 11  CREATININE 0.83 0.78  CALCIUM 8.8 8.5   Liver Function Tests:  Recent Labs  11/20/14 1919 11/21/14 0422  AST 18 16  ALT 14 12  ALKPHOS 127* 107  BILITOT 0.7 0.8  PROT 6.3 5.3*  ALBUMIN 3.0* 2.6*     CBC:  Recent Labs  11/20/14 1919 11/21/14 0422  WBC 3.9* 5.4  NEUTROABS 2.6  --   HGB 10.5* 9.7*  HCT 34.2* 31.2*  MCV 82.8 82.3  PLT 225 199    Dg Chest Port 1 View  11/20/2014   CLINICAL DATA:  Shortness of breath, bilateral leg edema.  EXAM: PORTABLE CHEST - 1 VIEW  COMPARISON:  Right rib series 11/11/2014, frontal and lateral chest radiographs 09/21/2014 .  FINDINGS: Lower lung volumes from prior exam. The heart remains enlarged. There is tortuosity and atherosclerosis of the thoracic aorta. Hilar prominence is similar to prior exam. Vascular congestion again seen, likely accentuated by technique. No confluent airspace disease. No large pleural effusion. No pneumothorax.  IMPRESSION: Hypoventilatory chest. Question worsening vascular congestion versus differences in technique. Cardiomegaly again seen.   Electronically Signed   By: Jeb Levering M.D.    On: 11/20/2014 19:16      Medications:   Impression: Active Problems:   ADENOCARCINOMA, PROSTATE   DM type 2 causing complication   HTN (hypertension)   Cardiomyopathy, nonischemic   Accelerated hypertension   Retained ureteral stent   CHF exacerbation   Acute exacerbation of CHF (congestive heart failure)     Plan: Spironolactone 12.5 by mouth twice a day Lasix 20 IV every 12 monitor electrolytes potassium Isordil 20 3 times a day compression hose  Consultants: Cardiology    Procedures   Antibiotics:                   Code Status: Full   Family Communication:    Disposition Plan continue diuresis optimizing hemodynamics compression hose monitoring of electrolytes  Time spent: 30 minutes   LOS: 1 day   Jilberto Vanderwall M   11/21/2014, 12:41 PM

## 2014-11-21 NOTE — Progress Notes (Signed)
UR chart review completed.  

## 2014-11-21 NOTE — Care Management Note (Signed)
    Page 1 of 1   11/25/2014     1:09:51 PM CARE MANAGEMENT NOTE 11/25/2014  Patient:  Oscar Hickman, Oscar Hickman   Account Number:  [id number]  Date Initiated:  11/21/2014  Documentation initiated by:  Theophilus Kinds  Subjective/Objective Assessment:   Pt admitted from home with CHF.  Pt lives with his wife and wants to return home at discharge. Pt has a cane and walker for home use. Pts wife stated that pt has not been able to do for himself for the last few days.     Action/Plan:   Pt gave CM permission to call his wife. Will obtain PT consult. Will follow for discharge planning needs.   Anticipated DC Date:  11/25/2014   Anticipated DC Plan:  [place]  CM consult      Choice offered to / List presented to:             Status of service:  Completed, signed off Medicare Important Message given?  YES (If response is "NO", the following Medicare IM given date fields will be blank) Date Medicare IM given:  11/22/2014 Medicare IM given by:  Christinia Gully C Date Additional Medicare IM given:  11/25/2014 Additional Medicare IM given by:  Theophilus Kinds  Discharge Disposition:  [place]  Per UR Regulation:    If discussed at Long Length of Stay Meetings, dates discussed:    Comments:  11/25/14 [place], RN BSN CM Pt discharged to [facility] today. CSW to arrange discharge to facility.  11/22/14 [place], RN BSN CM Cm encouraged pt to work with PT. Explained to pt that pts wife stated that he had to be able to perform some ADL's for himself if he returns home. Will continue to follow. Would benefit from [facility] at discharge if pt returns home at discharge.  11/21/14 [place], RN BSN CM

## 2014-11-21 NOTE — Consult Note (Signed)
CARDIOLOGY CONSULT NOTE   Patient ID: Oscar Hickman MRN: [id number] DOB/AGE: [birth year] 74 y.o.  Admit Date: 11/20/2014 Referring Physician: PTH Primary Physician: [name], MD Consulting Cardiologist: Rozann Lesches MD Primary Cardiologist: Formerly Dr. Jacqulyn Ducking (last seen 2012) Reason for Consultation: CHF  Clinical Summary Oscar Hickman is a 74 y.o.male with hx of NICM with most recent cardiac cath in April of 2012 revealing non-obstructive disease with EF of 25%, hypertension, hyperlipidemia and systolic CHF. He presented to ER with complaints of generalized weakness and worsening dyspnea. CXR revealed worsening vascular congestion.    In ER troponin was found to be elevated at 0.14, and has trended down since admission 0.13, 0.11. Pro-BNP elevated at 2,653.0. EKG, normal sinus rhythm with T-wave inversion in the lateral leads, unchanged from previous EKG in 06/2014. BP on admission 158/102, O2 Sat 100%, afebrile. Elevated Alk Phos 127. He was treated with IV lasix 40 mg and ASA. He has diuresed 3.3 liters. He remains on 20 mg IV BID.  He is confused and it is difficult to gain history from him. History is obtained from past and current medical records.    He denies pain but with upper body movement he is crying out. He has hx significant back injury of the T11 and osteoporosis.   No Known Allergies  Medications Scheduled Medications: . aspirin EC  81 mg Oral Daily  . carvedilol  25 mg Oral BID WC  . cloNIDine  0.2 mg Oral BID  . enoxaparin (LOVENOX) injection  40 mg Subcutaneous Q24H  . enzalutamide  160 mg Oral Daily  . fentaNYL  25 mcg Transdermal Q72H  . furosemide  20 mg Intravenous Q12H  . insulin aspart  0-9 Units Subcutaneous TID WC  . Insulin Glargine  24-25 Units Subcutaneous Daily  . isosorbide dinitrate  20 mg Oral TID  . lisinopril  2.5 mg Oral Daily  . potassium chloride  10 mEq Intravenous Once  . potassium chloride SA  40 mEq Oral Daily   . predniSONE  5 mg Oral BID WC  . sodium chloride  3 mL Intravenous Q12H  . spironolactone  12.5 mg Oral BID  . tamsulosin  0.4 mg Oral Daily    PRN Medications: sodium chloride, acetaminophen **OR** acetaminophen, albuterol, HYDROmorphone (DILAUDID) injection, ondansetron **OR** ondansetron (ZOFRAN) IV, sodium chloride   Past Medical History  Diagnosis Date  . Skin infection   . Essential hypertension   . Mixed hyperlipidemia   . Type 2 diabetes mellitus with peripheral neuropathy   . Chronic headache   . Anxiety   . Osteoarthritis   . 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 Nonischemic cardiomyopathy     LVEF 25% 2012  . Medical non-compliance   . Cognitive dysfunction   . Traumatic compression fracture of T11 thoracic vertebra   . Osteoporosis     Past Surgical History  Procedure Laterality Date  . Transurethral resection of prostate  03/2008  . Cardiac catheterization  02/2009    Minimal non-obstructive CAD  . Cystoscopy w/ ureteral stent placement Right 04/06/2013    Procedure: CYSTOSCOPY WITH RETROGRADE PYELOGRAM/URETERAL STENT PLACEMENT;  Surgeon: Marissa Nestle, MD;  Location: [facility];  Service: Urology;  Laterality: Right;  . Cystoscopy w/ ureteral stent removal Right 04/06/2013    Procedure: CYSTOSCOPY WITH STENT REMOVAL;  Surgeon: Marissa Nestle, MD;  Location: [facility];  Service: Urology;  Laterality: Right;    Family History  Problem Relation Age of Onset  . Cancer Mother   . Cancer Father     Social History Oscar Hickman reports that he has never smoked. He has never used smokeless tobacco. Oscar Hickman reports that he does not drink alcohol.  Review of Systems Complete review of systems are found to be negative unless outlined in H&P above.  Physical Examination Blood pressure 145/98, pulse 80, temperature 98.3 F (36.8 C), temperature source Oral, resp. rate 20, height $RemoveBe'5\' 9"'XFDETDqUc$  (1.753 m), weight 183 lb 4.8 oz  (83.144 kg), SpO2 95 %.  Intake/Output Summary (Last 24 hours) at 11/21/14 1024 Last data filed at 11/21/14 0411  Gross per 24 hour  Intake      3 ml  Output   3350 ml  Net  -3347 ml    Telemetry: Sinus rhythm with frequent PVC's. Rate in the 80's.   UTM:LYYTK, confused, no acute distress HEENT: Conjunctiva and lids normal, oropharynx clear with moist mucosa. Neck: Supple, elevated JVP, no carotid bruits, no thyromegaly. Lungs: Bilateral crackles, some inspiratory wheezes in the upper airway. Cardiac: Iregular rate and rhythm, no S3 or significant systolic murmur, no pericardial rub. Abdomen: Soft, nontender, no hepatomegaly, bowel sounds present, no guarding or rebound. Extremities: 2+pitting pre-tibial edema , distal pulses 1+. Skin: Warm and dry. Musculoskeletal: No kyphosis. Neuropsychiatric: Alert , disoriented to time, place, affect grossly appropriate.   Prior Cardiac Testing/Procedures Echo 05/2014 Left ventricle: The cavity size was normal. Wall thickness was increased in a pattern of moderate to severe LVH. Systolic function was severely reduced. The estimated ejection fraction was in the range of 20% to 25%. Regional wall motion abnormalities cannot be excluded. - Aortic valve: There was mild regurgitation. Valve area (Vmax): 1.9 cm^2. - Mitral valve: There was moderate regurgitation. - Left atrium: The atrium was massively dilated. - Right ventricle: The cavity size was mildly dilated. Wall thickness was increased. Systolic function was mildly reduced. - Right atrium: The atrium was severely dilated. - Pulmonary arteries: Systolic pressure was moderately increased. PA peak pressure: 60 mm Hg (S).  Lab Results  Basic Metabolic Panel:  Recent Labs Lab 11/20/14 1919 11/21/14 0422  NA 139 140  K 3.8 2.8*  CL 103 102  CO2 30 31  GLUCOSE 179* 110*  BUN 14 11  CREATININE 0.83 0.78  CALCIUM 8.8 8.5    Liver Function Tests:  Recent  Labs Lab 11/20/14 1919 11/21/14 0422  AST 18 16  ALT 14 12  ALKPHOS 127* 107  BILITOT 0.7 0.8  PROT 6.3 5.3*  ALBUMIN 3.0* 2.6*    CBC:  Recent Labs Lab 11/20/14 1919 11/21/14 0422  WBC 3.9* 5.4  NEUTROABS 2.6  --   HGB 10.5* 9.7*  HCT 34.2* 31.2*  MCV 82.8 82.3  PLT 225 199    Cardiac Enzymes:  Recent Labs Lab 11/20/14 1919 11/20/14 2253 11/21/14 0422  TROPONINI 0.14* 0.13* 0.11*    BNP: 2,653   Radiology: Dg Chest Port 1 View  11/20/2014   CLINICAL DATA:  Shortness of breath, bilateral leg edema.  EXAM: PORTABLE CHEST - 1 VIEW  COMPARISON:  Right rib series 11/11/2014, frontal and lateral chest radiographs 09/21/2014 .  FINDINGS: Lower lung volumes from prior exam. The heart remains enlarged. There is tortuosity and atherosclerosis of the thoracic aorta. Hilar prominence is similar to prior exam. Vascular congestion again seen, likely accentuated by technique. No confluent airspace disease. No large pleural effusion. No pneumothorax.  IMPRESSION: Hypoventilatory chest. Question worsening vascular congestion versus  differences in technique. Cardiomegaly again seen.   Electronically Signed   By: Jeb Levering M.D.   On: 11/20/2014 19:16     ECG: Sinus rhythm with borderline IVCD and probable repolarization abnormalities, PVC, nonspecific T-wave changes.   Impression and Recommendations  1. Acute on Chronic Systolic CHF in the setting of NICM: He is diuresing well currently, with >3 liters since admission last night. He continues to have significant LEE and diminished breath sounds. Echo in July of 2015 demonstrated EF of 20-25%. There have been issues with medical non-compliance. No family members available to confirm his compliance lately. Would suspect that he is not a candidate for ICD with multiple co-morbidities. Continue diureses and potassium replacement. Keep potassium at 4.0. Continue carvedilol. Stop amlodipine with reduced EF. Not on ACE currently.  Would begin low dose lisinopril 2.5 mg. Had been on Accupril in the past. Creatinine 0.78 this am. BP stable enough to add this medication.Will add low dose spironolactone 12.5 mg BID. Follow up BMET to check potassium status and kidney fx.   2. Elevated Troponin: Likely related to CHF and reduced EF. No plans for invasive cardiac testing.   3. Hypertension: On multiple medications at home. Uncertain if he is compliant and he has a hx of non-compliance. He is confused and unable to tell me if he is taking is medications. Will need to consider SNF on discharge if this is not already being planned.   4. Prostate CA: Being followed by oncology, with Luevenia Maxin and Dep-Lupron with prednisone therapy.  4. AMS: Uncertain if this is baseline or acute. Consider CT of brain if clinicially indicated.   Signed: Phill Myron. Lawrence NP [place]  11/21/2014, 10:24 AM Co-Sign MD   Attending note:  Patient seen and examined. Reviewed records and updated the medical history in chart. Discussed the case with Ms. Lawrence NP whose findings are outlined above, and I modified above note as well. Mr. Navarrete has a previously documented history of nonischemic cardiomyopathy with LVEF approximately 20-25%, minimal coronary atherosclerosis by cardiac catheterization in 2010. Medication and follow-up noncompliance are clearly issues, he presents now with symptoms including progressive fatigue, shortness of breath, also back discomfort. He is a poor historian. On examination he has elevated JVP, decreased breath sounds at the bases with scattered rhonchi, indistinct PMI with RRR, 2-3+ leg edema. He has been hypertensive fairly consistently. Lab work shows creatinine 0.7, minimal troponin I elevation with peak of 0.14 in relatively flat pattern, hemoglobin 9.7, platelet 199. Chest x-ray does show vascular congestion. Plan is to manage for acute on chronic systolic heart failure with volume overload. He is already diuresing well on  IV Lasix. Prior outpatient medication regimen reviewed, was fairly extensive. Plan is to try and simplify if possible. We will continue Coreg, initiate low-dose ACE inhibitor and uptitrate as tolerated, continue Lasix with potassium supplements. Also adding Aldactone. Would be best if we can get him off clonidine. He is on Isordil which was part of a previous nitrate/hydralazine regimen. We will follow with you. Would probably hold off on follow-up echocardiogram until he is better treated with medical regimen.  Satira Sark, M.D., F.A.C.C.

## 2014-11-22 ENCOUNTER — Ambulatory Visit (HOSPITAL_COMMUNITY): Payer: Commercial Managed Care - HMO

## 2014-11-22 ENCOUNTER — Other Ambulatory Visit (HOSPITAL_COMMUNITY): Payer: Commercial Managed Care - HMO

## 2014-11-22 ENCOUNTER — Inpatient Hospital Stay (HOSPITAL_COMMUNITY): Payer: Medicare HMO | Admitting: Oncology

## 2014-11-22 LAB — GLUCOSE, CAPILLARY
GLUCOSE-CAPILLARY: 156 mg/dL — AB (ref 70–99)
GLUCOSE-CAPILLARY: 117 mg/dL — AB (ref 70–99)
Glucose-Capillary: 122 mg/dL — ABNORMAL HIGH (ref 70–99)
Glucose-Capillary: 75 mg/dL (ref 70–99)

## 2014-11-22 LAB — BASIC METABOLIC PANEL
Anion gap: 4 — ABNORMAL LOW (ref 5–15)
BUN: 9 mg/dL (ref 6–23)
CO2: 35 mmol/L — AB (ref 19–32)
Calcium: 8.5 mg/dL (ref 8.4–10.5)
Chloride: 103 mEq/L (ref 96–112)
Creatinine, Ser: 0.78 mg/dL (ref 0.50–1.35)
GFR calc Af Amer: >90 mL/min (ref >90)
GFR, EST NON AFRICAN AMERICAN: 88 mL/min — AB (ref >90)
Glucose, Bld: 130 mg/dL — ABNORMAL HIGH (ref 70–99)
Potassium: 3.3 mmol/L — ABNORMAL LOW (ref 3.5–5.1)
Sodium: 142 mmol/L (ref 135–145)

## 2014-11-22 LAB — MAGNESIUM: Magnesium: 1.4 mg/dL — ABNORMAL LOW (ref 1.5–2.5)

## 2014-11-22 MED ORDER — CLONIDINE HCL 0.1 MG PO TABS
0.0500 mg | ORAL_TABLET | Freq: Two times a day (BID) | ORAL | Status: DC
  Administered 2014-11-22 – 2014-11-25 (×6): 0.05 mg via ORAL
  Filled 2014-11-22 (×6): qty 1

## 2014-11-22 MED ORDER — LISINOPRIL 10 MG PO TABS
10.0000 mg | ORAL_TABLET | Freq: Every day | ORAL | Status: DC
  Administered 2014-11-22 – 2014-11-23 (×2): 10 mg via ORAL
  Filled 2014-11-22 (×2): qty 1

## 2014-11-22 MED ORDER — FUROSEMIDE 10 MG/ML IJ SOLN
40.0000 mg | Freq: Two times a day (BID) | INTRAMUSCULAR | Status: DC
  Administered 2014-11-22 – 2014-11-25 (×7): 40 mg via INTRAVENOUS
  Filled 2014-11-22 (×7): qty 4

## 2014-11-22 NOTE — Clinical Social Work Placement (Signed)
Clinical Social Work Department CLINICAL SOCIAL WORK PLACEMENT NOTE 11/22/2014  Patient:  PLEZ, BELTON  Account Number:  [id number] Admit date:  11/20/2014  Clinical Social Worker:  Benay Pike, LCSW  Date/time:  11/22/2014 02:47 PM  Clinical Social Work is seeking post-discharge placement for this patient at the following level of care:   SKILLED NURSING   (*CSW will update this form in Epic as items are completed)   11/22/2014  Patient/family provided with [place] Department of Clinical Social Work's list of facilities offering this level of care within the geographic area requested by the patient (or if unable, by the patient's family).  11/22/2014  Patient/family informed of their freedom to choose among providers that offer the needed level of care, that participate in Medicare, Medicaid or managed care program needed by the patient, have an available bed and are willing to accept the patient.  11/22/2014  Patient/family informed of [facility]' ownership interest in [facility], as well as of the fact that they are under no obligation to receive care at this facility.  PASARR submitted to EDS on 11/22/2014 PASARR number received on 11/22/2014  FL2 transmitted to all facilities in geographic area requested by pt/family on  11/22/2014 FL2 transmitted to all facilities within larger geographic area on   Patient informed that his/her managed care company has contracts with or will negotiate with  certain facilities, including the following:     Patient/family informed of bed offers received:   Patient chooses bed at  Physician recommends and patient chooses bed at    Patient to be transferred to  on   Patient to be transferred to facility by  Patient and family notified of transfer on  Name of family member notified:    The following physician request were entered in Epic:   Additional Comments:  Benay Pike, [place]

## 2014-11-22 NOTE — Clinical Social Work Note (Signed)
CSW presented bed offers and pt's wife chooses [facility] if necessary. She states they will discuss further this weekend and if pt improves, will return home. [facility] will begin authorization.  Benay Pike, [place]

## 2014-11-22 NOTE — Progress Notes (Signed)
Patient has mildly less dyspnea and orthopnea today no anginal chest pain Oscar Hickman HQI:[id number] DOB: 1941-04-19 DOA: 11/20/2014 PCP: Oscar Curet, MD             Physical Exam: Blood pressure 114/67, pulse 68, temperature 97.5 F (36.4 C), temperature source Oral, resp. rate 20, height $RemoveBe'5\' 9"'nypfAASVs$  (1.753 m), weight 183 lb 4.8 oz (83.144 kg), SpO2 100 %. neck no JVD no bruits no thyromegaly lungs diminished breath sounds in bases no rales wheeze rhonchi appreciable heart regular rhythm no S3 auscultated measles rubs extremities 3+ chronic pitting edema   Investigations:  No results found for this or any previous visit (from the past 240 hour(s)).   Basic Metabolic Panel:  Recent Labs  11/21/14 0422 11/22/14 0655  NA 140 142  K 2.8* 3.3*  CL 102 103  CO2 31 35*  GLUCOSE 110* 130*  BUN 11 9  CREATININE 0.78 0.78  CALCIUM 8.5 8.5   Liver Function Tests:  Recent Labs  11/20/14 1919 11/21/14 0422  AST 18 16  ALT 14 12  ALKPHOS 127* 107  BILITOT 0.7 0.8  PROT 6.3 5.3*  ALBUMIN 3.0* 2.6*     CBC:  Recent Labs  11/20/14 1919 11/21/14 0422  WBC 3.9* 5.4  NEUTROABS 2.6  --   HGB 10.5* 9.7*  HCT 34.2* 31.2*  MCV 82.8 82.3  PLT 225 199    Dg Chest Port 1 View  11/20/2014   CLINICAL DATA:  Shortness of breath, bilateral leg edema.  EXAM: PORTABLE CHEST - 1 VIEW  COMPARISON:  Right rib series 11/11/2014, frontal and lateral chest radiographs 09/21/2014 .  FINDINGS: Lower lung volumes from prior exam. The heart remains enlarged. There is tortuosity and atherosclerosis of the thoracic aorta. Hilar prominence is similar to prior exam. Vascular congestion again seen, likely accentuated by technique. No confluent airspace disease. No large pleural effusion. No pneumothorax.  IMPRESSION: Hypoventilatory chest. Question worsening vascular congestion versus differences in technique. Cardiomegaly again seen.   Electronically Signed   By: Oscar Hickman M.D.    On: 11/20/2014 19:16      Medications:   Impression:  Active Problems:   ADENOCARCINOMA, PROSTATE   DM type 2 causing complication   HTN (hypertension)   Cardiomyopathy, nonischemic   Accelerated hypertension   Retained ureteral stent   CHF exacerbation   Acute exacerbation of CHF (congestive heart failure)     Plan: Increase Lasix to 40 IV twice a day continues prolactin 12.5 increase lisinopril to 10 will have compression devices on legs to mobilize third space fluid monitor be met daily obtain serum magnesium today  Consultants: Cardiology   Procedures   Antibiotics:                   Code Status:  Family Communication:    Disposition Plan increase Lasix to 40 twice a day spironolactin 12/2 twice a day. Lisinopril 10 grams by mouth daily monitor be met and magnesium  Time spent: 30 minutes   LOS: 2 days   Oscar Hickman M   11/22/2014, 3:39 PM

## 2014-11-22 NOTE — Clinical Social Work Psychosocial (Signed)
Clinical Social Work Department BRIEF PSYCHOSOCIAL ASSESSMENT 11/22/2014  Patient:  Oscar Hickman, Oscar Hickman     Account Number:  [id number]     Admit date:  11/20/2014  Clinical Social Worker:  Wyatt Haste  Date/Time:  11/22/2014 02:51 PM  Referred by:  CSW  Date Referred:  11/22/2014 Referred for  SNF Placement   Other Referral:   Interview type:  Patient Other interview type:   wife- Oscar Hickman    PSYCHOSOCIAL DATA Living Status:  WIFE Admitted from facility:   Level of care:   Primary support name:  Oscar Hickman Primary support relationship to patient:  SPOUSE Degree of support available:   supportive    CURRENT CONCERNS Current Concerns  Post-Acute Placement   Other Concerns:    SOCIAL WORK ASSESSMENT / PLAN CSW met with pt at bedside. Pt reports he lives with his wife and they have an adult granddaughter who is there a lot as well. Pt can't remember specifically what brought him to hospital. CSW asked him how he felt about considering rehab and he said he would go "for a little while." He expressed that he did not like the idea of being away from home for long. CSW provided brief support. Pt requested that CSW call his wife. CSW spoke with Oscar Hickman who states that pt appears to not be very motivated at home. He requests that she take him all medications and food. Pt gets up to go to the bathroom, but does not do much else. He uses a walker. CSW discussed PT recommendation of SNF. She was concerned about pt's response to SNF, but was also concerned about his ability to come home if he is unable to care for himself. CSW told her that he was open to it for a short period of time. They request [facility] if possible. Pt's wife is aware of [employer] authorization process. They have several children, but all of them work. She plans to have their son call for additional questions.   Assessment/plan status:  Psychosocial Support/Ongoing Assessment of Needs Other assessment/ plan:   Information/referral  to community resources:   SNF list    PATIENT'S/FAMILY'S RESPONSE TO PLAN OF CARE: CSW will initiate bed search. Pt and pt's wife are agreeable to short term SNF.       Oscar Hickman, Oscar Hickman

## 2014-11-22 NOTE — Progress Notes (Signed)
Dr. Frances Furbish notified that patienthad a BM streaked with blood.  I also informed MD that patient had a 7 beat run of Vtach.  MD stated he will be in to see patient soon.  No new orders received.  Will continue to monitor patient closely.

## 2014-11-22 NOTE — Clinical Social Work Placement (Signed)
Clinical Social Work Department CLINICAL SOCIAL WORK PLACEMENT NOTE 11/22/2014  Patient:  Oscar Hickman, Oscar Hickman  Account Number:  [id number] Admit date:  11/20/2014  Clinical Social Worker:  Benay Pike, LCSW  Date/time:  11/22/2014 02:47 PM  Clinical Social Work is seeking post-discharge placement for this patient at the following level of care:   SKILLED NURSING   (*CSW will update this form in Epic as items are completed)   11/22/2014  Patient/family provided with [place] Department of Clinical Social Work's list of facilities offering this level of care within the geographic area requested by the patient (or if unable, by the patient's family).  11/22/2014  Patient/family informed of their freedom to choose among providers that offer the needed level of care, that participate in Medicare, Medicaid or managed care program needed by the patient, have an available bed and are willing to accept the patient.  11/22/2014  Patient/family informed of [facility]' ownership interest in [facility], as well as of the fact that they are under no obligation to receive care at this facility.  PASARR submitted to EDS on 11/22/2014 PASARR number received on 11/22/2014  FL2 transmitted to all facilities in geographic area requested by pt/family on  11/22/2014 FL2 transmitted to all facilities within larger geographic area on   Patient informed that his/her managed care company has contracts with or will negotiate with  certain facilities, including the following:     Patient/family informed of bed offers received:  11/22/2014 Patient chooses bed at [facility] Physician recommends and patient chooses bed at  [facility]  Patient to be transferred to  on   Patient to be transferred to facility by  Patient and family notified of transfer on  Name of family member notified:    The following physician request were entered in Epic:   Additional Comments:  Benay Pike, [place]

## 2014-11-22 NOTE — Evaluation (Addendum)
Physical Therapy Evaluation Patient Details Name: Oscar Hickman MRN: [id number] DOB: 08-10-1941 Today's Date: 11/22/2014   History of Present Illness  74 year old male who   has a past medical history of Skin infection; HTN (hypertension) (11/23/2011); High cholesterol; Diabetes mellitus; Chronic headache; Anxiety; Osteoarthritis; COPD (chronic obstructive pulmonary disease); Peripheral neuropathy; Prostate cancer (2009); CHF (congestive heart failure); Medical non-compliance; Heart disease; Cognitive dysfunction; Back pain; Traumatic compression fracture of T11 thoracic vertebra (11/08/2014); and Osteoporosis (11/19/2014). Today came to the hospital beginning of gradual onset generalized weakness, fatigue progressive shortness of breath. Patient has a history of prostate cancer and has been complaining of back pain. He recently underwent MRI of the lumbar spine on 11/15/2014 which showed no evidence of regional metastatic disease, showed acute/subacute compression fractures at T11-T12 and L2, benign osteoporotic fractures. Patient also has a retained ureteral stent for over one year, he was diagnosed with UTI and right-sided hydronephrosis. Patient has an appointment to see urologist as outpatient.  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 Cardiology was consulted by the ED physician Dr. Noemi Chapel, who recommended diuresis. Patient also has admitted troponin 0.14 in the ED.  Clinical Impression  Patient received sidelying in bed with CSW present; patient confused but did state that he needed to perform BM. Significant edema of +2 to +3 noted B legs and patient did require Mod(A) and extended time to some from sidelying to sitting EOB. Slight lean to R, unable to correct with manual cues possibly secondary to patient's confusion. Sit to stand with Max(A)x2 and stand pivot transfer to [facility] with Mod-Max(A)x2 for safety and  sequencing; nurse was present for and assisted with transfer to [facility]. Patient able to maintain upright sitting on BSC with B upper extremity support. Patient remained confused and did not consistently correctly answer orientation and cognitive questions. Vitals remained WNL throughout session on 2 LPM O2 on nasal cannula. Patient left upright on [facility] with nursing tech present, all needs otherwise met.     Follow Up Recommendations SNF    Equipment Recommendations  None recommended by PT    Recommendations for Other Services OT consult;Speech consult     Precautions / Restrictions Precautions Precautions: Fall Precaution Comments: Recent compression fracture T11, ejection fraction 20% Restrictions Weight Bearing Restrictions: No      Mobility  Bed Mobility Overal bed mobility: Needs Assistance Bed Mobility: Sidelying to Sit   Sidelying to sit: Mod assist       General bed mobility comments: extended time required to complete task  Transfers Overall transfer level: Needs assistance Equipment used: Rolling walker (2 wheeled) Transfers: Sit to/from [facility] Sit to Stand: +2 physical assistance;Max assist Stand pivot transfers: Mod assist;Max assist;+2 physical assistance       General transfer comment: Poor safety awareness and overall mobility  Ambulation/Gait             General Gait Details: Not assessed at this time  Stairs            Wheelchair Mobility    Modified Rankin (Stroke Patients Only)       Balance Overall balance assessment: Needs assistance Sitting-balance support: Bilateral upper extremity supported Sitting balance-Leahy Scale: Fair Sitting balance - Comments: Mod manual cues to come to full upright Postural control: Right lateral lean (using bed rail for support) Standing balance support: Bilateral upper extremity supported Standing balance-Leahy Scale: Fair  Pertinent  Vitals/Pain Pain Assessment: No/denies pain    Home Living Family/patient expects to be discharged to:: Private residence Living Arrangements: Spouse/significant other;Other relatives   Type of Home: House Home Access: Stairs to enter Entrance Stairs-Rails:  (unknown) Entrance Stairs-Number of Steps: a couple Home Layout:  (unknown) Home Equipment: Cane - single point Additional Comments: Patient was confused on this date and unable to provide accurate information regarding prior level of function or equipment present at home    Prior Function Level of Independence: Independent         Comments: Per CSW, wife states that patient has required increasing levels of assist; wife also reports that she is getting heart surgery soon and that patient will need to be able to do things for himself to come home     Hand Dominance        Extremity/Trunk Assessment               Lower Extremity Assessment: Generalized weakness;Difficult to assess due to impaired cognition (confusion prevented full assessment of LE function)         Communication   Communication: Other (comment) (Confusion, requires redirection)  Cognition Arousal/Alertness: Awake/alert Behavior During Therapy: Impulsive (confused, required redirection) Overall Cognitive Status: No family/caregiver present to determine baseline cognitive functioning                      General Comments      Exercises        Assessment/Plan    PT Assessment Patient needs continued PT services  PT Diagnosis Difficulty walking;Abnormality of gait;Generalized weakness;Altered mental status   PT Problem List Decreased strength;Decreased coordination;Cardiopulmonary status limiting activity;Decreased cognition;Decreased activity tolerance;Decreased knowledge of use of DME;Decreased balance;Decreased safety awareness;Decreased mobility;Decreased knowledge of precautions  PT Treatment Interventions DME  instruction;Therapeutic exercise;Gait training;Balance training;Stair training;Neuromuscular re-education;Functional mobility training;Therapeutic activities;Patient/family education   PT Goals (Current goals can be found in the Care Plan section) Acute Rehab PT Goals Patient Stated Goal: none PT Goal Formulation: Patient unable to participate in goal setting Time For Goal Achievement: 12/06/14 Potential to Achieve Goals: Fair    Frequency Min 3X/week   Barriers to discharge Other (comment) (Per CSW, patient's wife needs patient to be able to perform tasks on his own because she is having heart surgery soon and will not be able to assist him)      Co-evaluation               End of Session Equipment Utilized During Treatment: Gait belt;Oxygen Activity Tolerance: Patient tolerated treatment well;Other (comment) (confused throughout session, focused on BM) Patient left: Other (comment);with nursing/sitter in room (Up on [facility] with nurse tech present) Nurse Communication: Other (comment) (Nurse present for and assisted with transfer to [facility])         Time: 9528-4132 PT Time Calculation (min) (ACUTE ONLY): 23 min   Charges:   PT Evaluation $Initial PT Evaluation Tier I: 1 Procedure     PT G CodesHunt Oris PT, DPT 11/22/2014, 11:47 AM

## 2014-11-22 NOTE — Progress Notes (Signed)
Consulting cardiologist: Rozann Lesches MD Primary Cardiologist: Reola Calkins (Formerly Lattie Haw)  Cardiology Specific Problem List: 1. A/C Systolic CHF 2. NICM 3. Elevated Troponin 4. Hypertension   Subjective:   More alert and responsive, less confused. He denies shortness of breath, continues with chronic back pain complaints.    Objective:   Temp:  [98.3 F (36.8 C)-98.6 F (37 C)] 98.3 F (36.8 C) (01/15 0614) Pulse Rate:  [66-72] 66 (01/15 0614) Resp:  [20] 20 (01/15 0614) BP: (126-140)/(76-82) 140/82 mmHg (01/15 0614) SpO2:  [100 %] 100 % (01/15 0614) Last BM Date:  (patient cannot recall)  Filed Weights   11/20/14 1826 11/20/14 2246  Weight: 180 lb (81.647 kg) 183 lb 4.8 oz (83.144 kg)    Intake/Output Summary (Last 24 hours) at 11/22/14 0923 Last data filed at 11/21/14 1854  Gross per 24 hour  Intake    580 ml  Output   1500 ml  Net   -920 ml    Telemetry: NSR with PVC's HR in the 70's.   Exam:  General: No acute distress.  HEENT: Conjunctiva and lids normal, oropharynx clear.  Lungs: Some mild bibasilar crackles, no wheezes or coughing.   Cardiac: Mildly elevated JVP, no bruits. RRR, soft 1/6 systolic murmur,  no gallop or rub.   Abdomen: Normoactive bowel sounds, nontender, nondistended.  Extremities: 2+ pretibial pitting edema, distal pulses full.  Neuropsychiatric: Alert and oriented x3, affect appropriate.   Lab Results:  Basic Metabolic Panel:  Recent Labs Lab 11/20/14 1919 11/21/14 0422 11/22/14 0655  NA 139 140 142  K 3.8 2.8* 3.3*  CL 103 102 103  CO2 30 31 35*  GLUCOSE 179* 110* 130*  BUN 14 11 9   CREATININE 0.83 0.78 0.78  CALCIUM 8.8 8.5 8.5    Liver Function Tests:  Recent Labs Lab 11/20/14 1919 11/21/14 0422  AST 18 16  ALT 14 12  ALKPHOS 127* 107  BILITOT 0.7 0.8  PROT 6.3 5.3*  ALBUMIN 3.0* 2.6*    CBC:  Recent Labs Lab 11/20/14 1919 11/21/14 0422  WBC 3.9* 5.4  HGB 10.5* 9.7*  HCT 34.2* 31.2*    MCV 82.8 82.3  PLT 225 199    Cardiac Enzymes:  Recent Labs Lab 11/20/14 2253 11/21/14 0422 11/21/14 1040  TROPONINI 0.13* 0.11* 0.10*    BNP:  Recent Labs  11/27/13 0554 05/19/14 0845 07/02/14 1326  PROBNP 1323.0* 7039.0* 5754.0*   Radiology: Dg Chest Port 1 View  11/20/2014   CLINICAL DATA:  Shortness of breath, bilateral leg edema.  EXAM: PORTABLE CHEST - 1 VIEW  COMPARISON:  Right rib series 11/11/2014, frontal and lateral chest radiographs 09/21/2014 .  FINDINGS: Lower lung volumes from prior exam. The heart remains enlarged. There is tortuosity and atherosclerosis of the thoracic aorta. Hilar prominence is similar to prior exam. Vascular congestion again seen, likely accentuated by technique. No confluent airspace disease. No large pleural effusion. No pneumothorax.  IMPRESSION: Hypoventilatory chest. Question worsening vascular congestion versus differences in technique. Cardiomegaly again seen.   Electronically Signed   By: Jeb Levering M.D.   On: 11/20/2014 19:16     Medications:   Scheduled Medications: . aspirin EC  81 mg Oral Daily  . carvedilol  25 mg Oral BID WC  . cloNIDine  0.1 mg Oral BID  . enoxaparin (LOVENOX) injection  40 mg Subcutaneous Q24H  . enzalutamide  160 mg Oral Daily  . fentaNYL  25 mcg Transdermal Q72H  . furosemide  20  mg Intravenous Q12H  . insulin aspart  0-9 Units Subcutaneous TID WC  . insulin glargine  24 Units Subcutaneous Daily  . isosorbide dinitrate  20 mg Oral TID  . lisinopril  5 mg Oral Daily  . potassium chloride SA  40 mEq Oral Daily  . predniSONE  5 mg Oral BID WC  . sodium chloride  3 mL Intravenous Q12H  . spironolactone  12.5 mg Oral BID  . tamsulosin  0.4 mg Oral Daily      PRN Medications:  sodium chloride, acetaminophen **OR** acetaminophen, albuterol, albuterol, HYDROmorphone (DILAUDID) injection, ondansetron **OR** ondansetron (ZOFRAN) IV, sodium chloride   Assessment and Plan:   1. Acute on  Chronic Systolic CHF in the setting of NICM: He continues to diurese with slowing down of output compared to yesterday. 1500 cc over last 24 hours compared to 3.3 liters on admission. No complaints of chest pain but continues significant edema in the lower extremities. He was started on spironolactone yesterday am, and continues on IV lasix 20 mg BID. Will increase this to 40 mg BID, or possible Q 8hrs as he remains overloaded.  HR is well controlled.   2. Hypertension: BP is still not adequately controlled in the setting of reduced EF of 15%. Creatinine is stable at 0.78. May need to increase ACE, and start to wean the clonidine to 0.05 mg BID and eventually stop, to avoid rebound hypertension. Will increase lisinopril to 10 mg daily.  Consider Entresto? Will discuss with Dr. Domenic Polite.  3. Elevated Troponin: Likely demand ischemia in the setting of CHF.    Phill Myron. Lawrence NP [place]  11/22/2014, 9:23 AM    Attending note:  Patient seen and examined. Discussed case with Ms. Lawrence NP whose findings are outlined above. I agree with assessment with the following additions. Patient seems to be improving somewhat clinically although remains volume overloaded. He reports that his breathing is improving. We are in the process of modifying his medications. At this time he continues on aspirin, Coreg, Aldactone, lisinopril, Isordil, and clonidine. Agree with plans to wean off clonidine completely, and would replace this by further advancing his standing medications, possibly adding hydralazine if needed. Question of using Delene Loll is raised above, not unreasonable, but I would like to see if we could just get him on a basic medical regimen first and then determine whether he is going to follow-up regularly. Lasix is being advanced. Keep an eye on renal function, intake and output.  Satira Sark, M.D., F.A.C.C.

## 2014-11-23 LAB — GLUCOSE, CAPILLARY
GLUCOSE-CAPILLARY: 81 mg/dL (ref 70–99)
GLUCOSE-CAPILLARY: 120 mg/dL — AB (ref 70–99)
Glucose-Capillary: 64 mg/dL — ABNORMAL LOW (ref 70–99)
Glucose-Capillary: 196 mg/dL — ABNORMAL HIGH (ref 70–99)
Glucose-Capillary: 212 mg/dL — ABNORMAL HIGH (ref 70–99)

## 2014-11-23 LAB — BASIC METABOLIC PANEL
Anion gap: 4 — ABNORMAL LOW (ref 5–15)
BUN: 14 mg/dL (ref 6–23)
CALCIUM: 8.5 mg/dL (ref 8.4–10.5)
CO2: 35 mmol/L — ABNORMAL HIGH (ref 19–32)
CREATININE: 0.84 mg/dL (ref 0.50–1.35)
Chloride: 100 mEq/L (ref 96–112)
GFR calc non Af Amer: 85 mL/min — ABNORMAL LOW (ref >90)
Glucose, Bld: 64 mg/dL — ABNORMAL LOW (ref 70–99)
Potassium: 3.7 mmol/L (ref 3.5–5.1)
SODIUM: 139 mmol/L (ref 135–145)

## 2014-11-23 MED ORDER — MAGNESIUM OXIDE 400 (241.3 MG) MG PO TABS
400.0000 mg | ORAL_TABLET | Freq: Every day | ORAL | Status: AC
  Administered 2014-11-23 – 2014-11-25 (×3): 400 mg via ORAL
  Filled 2014-11-23 (×3): qty 1

## 2014-11-23 MED ORDER — LISINOPRIL 10 MG PO TABS
20.0000 mg | ORAL_TABLET | Freq: Every day | ORAL | Status: DC
  Administered 2014-11-24 – 2014-11-25 (×2): 20 mg via ORAL
  Filled 2014-11-23 (×2): qty 2

## 2014-11-23 NOTE — Progress Notes (Signed)
Patient has less dyspnea no anginal chest pain no orthopnea on nebulizer therapy Oscar Hickman ARE:[id number] DOB: Oct 20, 1941 DOA: 11/20/2014 PCP: Oscar Curet, MD             Physical Exam: Blood pressure 123/82, pulse 62, temperature 97.9 F (36.6 C), temperature source Oral, resp. rate 20, height _0  (1.753 m), weight 183 lb 4.8 oz (83.144 kg), SpO2 99 %. nectar JVD no carotid bruits no thyromegaly lungs diminished breath sounds at bases no rales wheeze or rhonchi heart regular rhythm no S3-S4 no heaves thrills rubs extremities less pedal edema than yesterday when she'll compression devices in place   Investigations:  No results found for this or any previous visit (from the past 240 hour(s)).   Basic Metabolic Panel:  Recent Labs  11/22/14 0655 11/22/14 1552 11/23/14 0615  NA 142  --  139  K 3.3*  --  3.7  CL 103  --  100  CO2 35*  --  35*  GLUCOSE 130*  --  64*  BUN 9  --  14  CREATININE 0.78  --  0.84  CALCIUM 8.5  --  8.5  MG  --  1.4*  --    Liver Function Tests:  Recent Labs  11/20/14 1919 11/21/14 0422  AST 18 16  ALT 14 12  ALKPHOS 127* 107  BILITOT 0.7 0.8  PROT 6.3 5.3*  ALBUMIN 3.0* 2.6*     CBC:  Recent Labs  11/20/14 1919 11/21/14 0422  WBC 3.9* 5.4  NEUTROABS 2.6  --   HGB 10.5* 9.7*  HCT 34.2* 31.2*  MCV 82.8 82.3  PLT 225 199    No results found.    Medications:   Impression: Hypomagnesemia Active Problems:   ADENOCARCINOMA, PROSTATE   DM type 2 causing complication   HTN (hypertension)   Cardiomyopathy, nonischemic   Accelerated hypertension   Retained ureteral stent   CHF exacerbation   Acute exacerbation of CHF (congestive heart failure)     Plan: Magnesium oxide 40 mg by mouth daily for 3 days. Continue diuresis and sequential compression devices to mobilize third space fluids  Consultants: Cardiology    Procedures   Antibiotics:                   Code Status:  Family  Communication:    Disposition Plan continue diuresis increase ACE inhibitor monitor be met replenishment magnesium  Time spent: 30 minutes   LOS: 3 days   Oscar Hickman M   11/23/2014, 2:12 PM

## 2014-11-23 NOTE — Progress Notes (Signed)
Note has been sent to pharmacy requesting need for [facility]. Missing dose today and before. Pt does not have his own supply with him

## 2014-11-24 LAB — BASIC METABOLIC PANEL
Anion gap: 3 — ABNORMAL LOW (ref 5–15)
BUN: 17 mg/dL (ref 6–23)
CO2: 38 mmol/L — ABNORMAL HIGH (ref 19–32)
Calcium: 8.7 mg/dL (ref 8.4–10.5)
Chloride: 100 mEq/L (ref 96–112)
Creatinine, Ser: 0.84 mg/dL (ref 0.50–1.35)
GFR calc Af Amer: >90 mL/min (ref >90)
GFR calc non Af Amer: 85 mL/min — ABNORMAL LOW (ref >90)
Glucose, Bld: 115 mg/dL — ABNORMAL HIGH (ref 70–99)
POTASSIUM: 4.2 mmol/L (ref 3.5–5.1)
Sodium: 141 mmol/L (ref 135–145)

## 2014-11-24 LAB — GLUCOSE, CAPILLARY
GLUCOSE-CAPILLARY: 100 mg/dL — AB (ref 70–99)
Glucose-Capillary: 171 mg/dL — ABNORMAL HIGH (ref 70–99)
Glucose-Capillary: 122 mg/dL — ABNORMAL HIGH (ref 70–99)
Glucose-Capillary: 105 mg/dL — ABNORMAL HIGH (ref 70–99)

## 2014-11-24 NOTE — Progress Notes (Signed)
Subjective: Patient is resting. He feels better today. His breathing is better. No fever or chills. No chestg pain, nausea or vomiting.  Objective: Vital signs in last 24 hours: Temp:  [98 F (36.7 C)-98.2 F (36.8 C)] 98.1 F (36.7 C) (01/17 0620) Pulse Rate:  [66-74] 66 (01/17 0620) Resp:  [20-21] 21 (01/17 0620) BP: (124-129)/(69-86) 129/86 mmHg (01/17 0620) SpO2:  [99 %-100 %] 100 % (01/17 0620) Weight change:  Last BM Date: 11/22/14  Intake/Output from previous day: 01/16 0701 - 01/17 0700 In: 480 [P.O.:480] Out: 3650 [Urine:3650]  PHYSICAL EXAM General appearance: alert and no distress Resp: clear to auscultation bilaterally Cardio: S1, S2 normal GI: soft, non-tender; bowel sounds normal; no masses,  no organomegaly Extremities: extremities normal, atraumatic, no cyanosis or edema  Lab Results:  Results for orders placed or performed during the hospital encounter of 11/20/14 (from the past 48 hour(s))  Glucose, capillary     Status: Abnormal   Collection Time: 11/22/14 11:27 AM  Result Value Ref Range   Glucose-Capillary 156 (H) 70 - 99 mg/dL  Magnesium     Status: Abnormal   Collection Time: 11/22/14  3:52 PM  Result Value Ref Range   Magnesium 1.4 (L) 1.5 - 2.5 mg/dL  Glucose, capillary     Status: None   Collection Time: 11/22/14  4:48 PM  Result Value Ref Range   Glucose-Capillary 75 70 - 99 mg/dL  Glucose, capillary     Status: Abnormal   Collection Time: 11/22/14 10:28 PM  Result Value Ref Range   Glucose-Capillary 117 (H) 70 - 99 mg/dL   Comment 1 Documented in Chart    Comment 2 Notify RN   Basic metabolic panel     Status: Abnormal   Collection Time: 11/23/14  6:15 AM  Result Value Ref Range   Sodium 139 135 - 145 mmol/L    Comment: Please note change in reference range.   Potassium 3.7 3.5 - 5.1 mmol/L    Comment: Please note change in reference range.   Chloride 100 96 - 112 mEq/L   CO2 35 (H) 19 - 32 mmol/L   Glucose, Bld 64 (L) 70 - 99  mg/dL   BUN 14 6 - 23 mg/dL   Creatinine, Ser 0.84 0.50 - 1.35 mg/dL   Calcium 8.5 8.4 - 10.5 mg/dL   GFR calc non Af Amer 85 (L) >90 mL/min   GFR calc Af Amer >90 >90 mL/min    Comment: (NOTE) The eGFR has been calculated using the CKD EPI equation. This calculation has not been validated in all clinical situations. eGFR's persistently <90 mL/min signify possible Chronic Kidney Disease.    Anion gap 4 (L) 5 - 15  Glucose, capillary     Status: Abnormal   Collection Time: 11/23/14  7:50 AM  Result Value Ref Range   Glucose-Capillary 64 (L) 70 - 99 mg/dL   Comment 1 Documented in Chart    Comment 2 Notify RN   Glucose, capillary     Status: None   Collection Time: 11/23/14  9:06 AM  Result Value Ref Range   Glucose-Capillary 81 70 - 99 mg/dL   Comment 1 Documented in Chart    Comment 2 Notify RN   Glucose, capillary     Status: Abnormal   Collection Time: 11/23/14 11:31 AM  Result Value Ref Range   Glucose-Capillary 120 (H) 70 - 99 mg/dL   Comment 1 Documented in Chart    Comment 2 Notify RN  Glucose, capillary     Status: Abnormal   Collection Time: 11/23/14  4:56 PM  Result Value Ref Range   Glucose-Capillary 196 (H) 70 - 99 mg/dL   Comment 1 Documented in Chart    Comment 2 Notify RN   Glucose, capillary     Status: Abnormal   Collection Time: 11/23/14 10:00 PM  Result Value Ref Range   Glucose-Capillary 212 (H) 70 - 99 mg/dL  Basic metabolic panel     Status: Abnormal   Collection Time: 11/24/14  5:25 AM  Result Value Ref Range   Sodium 141 135 - 145 mmol/L    Comment: Please note change in reference range.   Potassium 4.2 3.5 - 5.1 mmol/L    Comment: Please note change in reference range.   Chloride 100 96 - 112 mEq/L   CO2 38 (H) 19 - 32 mmol/L   Glucose, Bld 115 (H) 70 - 99 mg/dL   BUN 17 6 - 23 mg/dL   Creatinine, Ser 0.84 0.50 - 1.35 mg/dL   Calcium 8.7 8.4 - 10.5 mg/dL   GFR calc non Af Amer 85 (L) >90 mL/min   GFR calc Af Amer >90 >90 mL/min     Comment: (NOTE) The eGFR has been calculated using the CKD EPI equation. This calculation has not been validated in all clinical situations. eGFR's persistently <90 mL/min signify possible Chronic Kidney Disease.    Anion gap 3 (L) 5 - 15  Glucose, capillary     Status: Abnormal   Collection Time: 11/24/14  7:39 AM  Result Value Ref Range   Glucose-Capillary 100 (H) 70 - 99 mg/dL   Comment 1 Documented in Chart    Comment 2 Notify RN     ABGS No results for input(s): PHART, PO2ART, TCO2, HCO3 in the last 72 hours.  Invalid input(s): PCO2 CULTURES No results found for this or any previous visit (from the past 240 hour(s)). Studies/Results: No results found.  Medications: I have reviewed the patient's current medications.  Assesment:   Active Problems:   ADENOCARCINOMA, PROSTATE   DM type 2 causing complication   HTN (hypertension)   Cardiomyopathy, nonischemic   Accelerated hypertension   Retained ureteral stent   CHF exacerbation   Acute exacerbation of CHF (congestive heart failure)    Plan:  Medications reviewed Continue current treatment Will continue to monitor Intake and Output CBC/BMP in Am    LOS: 4 days   Oscar Hickman 11/24/2014, 10:02 AM

## 2014-11-25 ENCOUNTER — Inpatient Hospital Stay
Admission: RE | Admit: 2014-11-25 | Discharge: 2014-12-15 | Disposition: A | Discharge status: Home / Self Care | Payer: Medicare HMO | Source: Ambulatory Visit | Attending: Internal Medicine | Admitting: Internal Medicine

## 2014-11-25 ENCOUNTER — Other Ambulatory Visit: Payer: Self-pay | Admitting: *Deleted

## 2014-11-25 ENCOUNTER — Ambulatory Visit (HOSPITAL_COMMUNITY): Payer: Commercial Managed Care - HMO | Admitting: Oncology

## 2014-11-25 DIAGNOSIS — S22080D Wedge compression fracture of T11-T12 vertebra, subsequent encounter for fracture with routine healing: Secondary | ICD-10-CM

## 2014-11-25 DIAGNOSIS — IMO0001 Reserved for inherently not codable concepts without codable children: Secondary | ICD-10-CM

## 2014-11-25 DIAGNOSIS — M4850XS Collapsed vertebra, not elsewhere classified, site unspecified, sequela of fracture: Secondary | ICD-10-CM

## 2014-11-25 DIAGNOSIS — C61 Malignant neoplasm of prostate: Secondary | ICD-10-CM

## 2014-11-25 LAB — CBC
HCT: 31.1 % — ABNORMAL LOW (ref 39.0–52.0)
Hemoglobin: 9.5 g/dL — ABNORMAL LOW (ref 13.0–17.0)
MCH: 25.8 pg — ABNORMAL LOW (ref 26.0–34.0)
MCHC: 30.5 g/dL (ref 30.0–36.0)
MCV: 84.5 fL (ref 78.0–100.0)
Platelets: 193 10*3/uL (ref 150–400)
RBC: 3.68 MIL/uL — AB (ref 4.22–5.81)
RDW: 18.5 % — ABNORMAL HIGH (ref 11.5–15.5)
WBC: 4 10*3/uL (ref 4.0–10.5)

## 2014-11-25 LAB — BASIC METABOLIC PANEL
ANION GAP: 4 — AB (ref 5–15)
Anion gap: 4 — ABNORMAL LOW (ref 5–15)
BUN: 21 mg/dL (ref 6–23)
BUN: 19 mg/dL (ref 6–23)
CALCIUM: 8.4 mg/dL (ref 8.4–10.5)
CALCIUM: 8.4 mg/dL (ref 8.4–10.5)
CHLORIDE: 96 meq/L (ref 96–112)
CO2: 36 mmol/L — ABNORMAL HIGH (ref 19–32)
CO2: 38 mmol/L — ABNORMAL HIGH (ref 19–32)
CREATININE: 0.85 mg/dL (ref 0.50–1.35)
CREATININE: 0.73 mg/dL (ref 0.50–1.35)
Chloride: 97 mEq/L (ref 96–112)
GFR calc Af Amer: >90 mL/min (ref >90)
GFR calc Af Amer: >90 mL/min (ref >90)
GFR, EST NON AFRICAN AMERICAN: 85 mL/min — AB (ref >90)
GLUCOSE: 86 mg/dL (ref 70–99)
Glucose, Bld: 87 mg/dL (ref 70–99)
POTASSIUM: 4 mmol/L (ref 3.5–5.1)
Potassium: 3.6 mmol/L (ref 3.5–5.1)
Sodium: 137 mmol/L (ref 135–145)
Sodium: 138 mmol/L (ref 135–145)

## 2014-11-25 LAB — GLUCOSE, CAPILLARY
GLUCOSE-CAPILLARY: 169 mg/dL — AB (ref 70–99)
Glucose-Capillary: 71 mg/dL (ref 70–99)

## 2014-11-25 MED ORDER — OXYCODONE-ACETAMINOPHEN 5-325 MG PO TABS: 1.0000 | ORAL_TABLET | ORAL | Status: DC | PRN

## 2014-11-25 MED ORDER — SPIRONOLACTONE 25 MG PO TABS: 25.0000 mg | ORAL_TABLET | Freq: Every day | ORAL | Status: DC

## 2014-11-25 MED ORDER — HYDRALAZINE HCL 25 MG PO TABS: 25.0000 mg | ORAL_TABLET | Freq: Three times a day (TID) | ORAL | Status: AC

## 2014-11-25 MED ORDER — LISINOPRIL 10 MG PO TABS
40.0000 mg | ORAL_TABLET | Freq: Every day | ORAL | Status: DC
Start: 2014-11-26 — End: 2014-11-25

## 2014-11-25 MED ORDER — SPIRONOLACTONE 25 MG PO TABS: 25.0000 mg | ORAL_TABLET | Freq: Every day | ORAL | Status: AC

## 2014-11-25 MED ORDER — LISINOPRIL 40 MG PO TABS: 40.0000 mg | ORAL_TABLET | Freq: Every day | ORAL | Status: AC

## 2014-11-25 MED ORDER — FENTANYL 25 MCG/HR TD PT72: 25.0000 ug | MEDICATED_PATCH | TRANSDERMAL | Status: DC

## 2014-11-25 MED ORDER — HYDRALAZINE HCL 25 MG PO TABS
25.0000 mg | ORAL_TABLET | Freq: Three times a day (TID) | ORAL | Status: DC
  Administered 2014-11-25: 25 mg via ORAL
  Filled 2014-11-25: qty 1

## 2014-11-25 NOTE — Clinical Social Work Note (Signed)
Pt d/c today to [facility]. Pt and pt's wife aware and agreeable. CSW left voicemail for pt's son, Vicente Serene as well. Family plan to bring chemo pills from home at [facility] request. D/C summary faxed. Pt will transfer with RN.  Benay Pike, [place]

## 2014-11-25 NOTE — Progress Notes (Signed)
Report given to [facility] in [place] [facility].  Patient on 2L o2 [place] and VSS.  All belongings and patient's medications sent with patient.  Patient in wheelchair and aware of discharge.  No complaints at this time. Patient to room 127.

## 2014-11-25 NOTE — Telephone Encounter (Signed)
[place]

## 2014-11-25 NOTE — Progress Notes (Signed)
Consulting cardiologist: Rozann Lesches MD  Cardiology Specific Problem List: 1. Acute on Chronic Systolic CHF 2. NICM 3. Hypertension 4. Demand Ischemia   Subjective:    Feeling much better, breathing better. No complaints of chest pain  Objective:   Temp:  [97.5 F (36.4 C)-97.9 F (36.6 C)] 97.5 F (36.4 C) (01/18 0636) Pulse Rate:  [67-78] 78 (01/18 0636) Resp:  [20-21] 20 (01/18 0636) BP: (125-133)/(86-98) 133/98 mmHg (01/18 0636) SpO2:  [95 %-100 %] 95 % (01/18 0955) Weight:  [181 lb 8 oz (82.328 kg)] 181 lb 8 oz (82.328 kg) (01/18 0636) Last BM Date: 11/22/14  Filed Weights   11/20/14 2246 11/24/14 0900 11/25/14 0636  Weight: 183 lb 4.8 oz (83.144 kg) 184 lb (83.462 kg) 181 lb 8 oz (82.328 kg)    Intake/Output Summary (Last 24 hours) at 11/25/14 1204 Last data filed at 11/25/14 5732  Gross per 24 hour  Intake    240 ml  Output   3700 ml  Net  -3460 ml    Exam:  General: No acute distress.  Lungs: Decreased breath sounds, clear to auscultation, nonlabored.  Cardiac: Elevated JVP, no bruits. RRR, no gallop or rub.   Abdomen: Normoactive bowel sounds, nontender, nondistended.  Extremities: 2+ pretibial  pitting edema, R> L distal pulses full.   Lab Results:  Basic Metabolic Panel:  Recent Labs Lab 11/22/14 1552  11/24/14 0525 11/25/14 0100 11/25/14 0531  NA  --   < > 141 137 138  K  --   < > 4.2 4.0 3.6  CL  --   < > 100 97 96  CO2  --   < > 38* 36* 38*  GLUCOSE  --   < > 115* 86 87  BUN  --   < > 17 21 19   CREATININE  --   < > 0.84 0.85 0.73  CALCIUM  --   < > 8.7 8.4 8.4  MG 1.4*  --   --   --   --   < > = values in this interval not displayed.  Liver Function Tests:  Recent Labs Lab 11/20/14 1919 11/21/14 0422  AST 18 16  ALT 14 12  ALKPHOS 127* 107  BILITOT 0.7 0.8  PROT 6.3 5.3*  ALBUMIN 3.0* 2.6*    CBC:  Recent Labs Lab 11/20/14 1919 11/21/14 0422 11/25/14 0100  WBC 3.9* 5.4 4.0  HGB 10.5* 9.7* 9.5*  HCT  34.2* 31.2* 31.1*  MCV 82.8 82.3 84.5  PLT 225 199 193    Cardiac Enzymes:  Recent Labs Lab 11/20/14 2253 11/21/14 0422 11/21/14 1040  TROPONINI 0.13* 0.11* 0.10*    Echocardiogram: 05/19/2014 Left ventricle: The cavity size was normal. Wall thickness was increased in a pattern of moderate to severe LVH. Systolic function was severely reduced. The estimated ejection fraction was in the range of 20% to 25%. Regional wall motion abnormalities cannot be excluded. - Aortic valve: There was mild regurgitation. Valve area (Vmax): 1.9 cm^2. - Mitral valve: There was moderate regurgitation. - Left atrium: The atrium was massively dilated. - Right ventricle: The cavity size was mildly dilated. Wall thickness was increased. Systolic function was mildly reduced. - Right atrium: The atrium was severely dilated. - Pulmonary arteries: Systolic pressure was moderately increased. PA peak pressure: 60 mm Hg (S).   Medications:   Scheduled Medications: . aspirin EC  81 mg Oral Daily  . carvedilol  25 mg Oral BID WC  . enoxaparin (LOVENOX)  injection  40 mg Subcutaneous Q24H  . enzalutamide  160 mg Oral Daily  . fentaNYL  25 mcg Transdermal Q72H  . furosemide  40 mg Intravenous Q12H  . insulin aspart  0-9 Units Subcutaneous TID WC  . insulin glargine  24 Units Subcutaneous Daily  . isosorbide dinitrate  20 mg Oral TID  . [START ON 11/26/2014] lisinopril  40 mg Oral Daily  . potassium chloride SA  40 mEq Oral Daily  . predniSONE  5 mg Oral BID WC  . sodium chloride  3 mL Intravenous Q12H  . spironolactone  12.5 mg Oral BID  . tamsulosin  0.4 mg Oral Daily    PRN Medications: sodium chloride, acetaminophen **OR** acetaminophen, albuterol, albuterol, HYDROmorphone (DILAUDID) injection, ondansetron **OR** ondansetron (ZOFRAN) IV, sodium chloride   Assessment and Plan:   1. Acute on Chronic Diastolic CHF: He has diuresed 11.6 liters since admission. Wt not reflective of  diureses. He remains on IV lasix 40 mg Q 12 hrs. Creatinine 0.73. He continues to have LEE but is overall better. He is averaging 2 liters a day. Will increase spironolactone to 25 mg BID in order to assist with BP and continued diureses. Hopefully transition to po diuretic and remove FC in am.   2. Hypertension: Higher than optimal for systolic dysfunction. D/C clonidine, as trying to wean down/off. Begin hydralazine for medicine optimization.  Magnesium down to 1.4 and has been repleated. He continues on lisinopril 20 mg daily, will increase to 40 mg daily, and continue nitrates. Will need to simplify his medications for better compliance as OP.   3. NICM: EF of 15%. On coreg, spironolactone, ACE with increased dose   Phill Myron. Lawrence NP [place]  11/25/2014, 12:04 PM    Attending note:  Patient seen and examined. Reviewed course and modified above note by Ms. Lawrence NP. Patient appears comfortable on examination, states that he feels better. Lungs are essentially clear with reduced breath sounds at the bases. Leg edema present but improving. His creatinine has been  stable on IV diuresis, and he has had nearly 12 L out since admission. Plan is to continue Coreg, Isordil, Zestril, and Aldactone. Go ahead and stop clonidine. Depending on blood pressure trend, we might add hydralazine to go along with his nitrates. Possibly change to oral Lasix tomorrow.  Satira Sark, M.D., F.A.C.C.

## 2014-11-25 NOTE — Discharge Summary (Signed)
Physician Discharge Summary  Oscar Hickman:[id number] DOB: 10/20/1941 DOA: 11/20/2014  PCP: [name], MD  Admit date: 11/20/2014 Discharge date: 11/25/2014   Recommendations for Outpatient Follow-up:  Patient is discharged to skilled nursing facilitation skilled nursing facility for aggressive physical rehabilitation for ambulation and strengthening as well as titration of medicines for cardiogenic chronic congestive heart failure and monitoring of renal as well as glycemic function Discharge Diagnoses:  Active Problems:   ADENOCARCINOMA, PROSTATE   DM type 2 causing complication   HTN (hypertension)   Cardiomyopathy, nonischemic   Accelerated hypertension   Retained ureteral stent   CHF exacerbation   Acute exacerbation of CHF (congestive heart failure)   Discharge Condition: Stable  Filed Weights   11/20/14 2246 11/24/14 0900 11/25/14 0636  Weight: 183 lb 4.8 oz (83.144 kg) 184 lb (83.462 kg) 181 lb 8 oz (82.328 kg)    History of present illness:  Patient with history of prostatic carcinoma cognitive chronic dysfunction chronic nonischemic cardiomyopathy ejection fraction 20-25% hypertension hyperlipidemia had some decompensated congestive heart failure is admitted for aggressive diuresis and titration of hemodynamic medicines with the improving result  Hospital Course:  Patient was optimize his first hemodynamic medicines with good diuresis over 7 days subsequent improvement in cognitive functioning on the last hospital day ENT was diminished linearly over time and he was able to ambulate with assistance short distances prior to discharge  Procedures:    Consultations:  Cardiology and physical therapy  Discharge Instructions  Discharge Instructions    Discharge instructions    Complete by:  As directed      Discharge patient    Complete by:  As directed             Medication List    STOP taking these medications        acetaminophen  650 MG CR tablet  Commonly known as:  TYLENOL     amLODipine 5 MG tablet  Commonly known as:  NORVASC     cloNIDine 0.2 MG tablet  Commonly known as:  CATAPRES     ibuprofen 200 MG tablet  Commonly known as:  ADVIL,MOTRIN      TAKE these medications        abiraterone Acetate 250 MG tablet  Commonly known as:  ZYTIGA  Take 4 tablets (1,000 mg total) by mouth daily. Take on an empty stomach 1 hour before or 2 hours after a meal     albuterol (2.5 MG/3ML) 0.083% nebulizer solution  Commonly known as:  PROVENTIL  Take 3 mLs (2.5 mg total) by nebulization every 2 (two) hours as needed for wheezing or shortness of breath.     aspirin EC 81 MG tablet  Take 81 mg by mouth daily.     calcium-vitamin D 500-200 MG-UNIT per tablet  Commonly known as:  OSCAL WITH D  Take 2 tablets by mouth daily with breakfast.     carvedilol 25 MG tablet  Commonly known as:  COREG  Take 25 mg by mouth 2 (two) times daily with a meal.     ENSURE PLUS Liqd  Take 237 mLs by mouth daily.     enzalutamide 40 MG capsule  Commonly known as:  XTANDI  Take 4 capsules (160 mg total) by mouth daily.     fentaNYL 25 MCG/HR patch  Commonly known as:  DURAGESIC - dosed mcg/hr  Place 1 patch (25 mcg total) onto the skin every 3 (three) days.  furosemide 20 MG tablet  Commonly known as:  LASIX  Take 2 tablets (40 mg total) by mouth daily.     hydrALAZINE 25 MG tablet  Commonly known as:  APRESOLINE  Take 1 tablet (25 mg total) by mouth every 8 (eight) hours.     Insulin Glargine 100 UNIT/ML Solostar Pen  Commonly known as:  LANTUS  Inject 24-25 Units into the skin daily.     isosorbide dinitrate 20 MG tablet  Commonly known as:  ISORDIL  Take 20 mg by mouth 3 (three) times daily.     lisinopril 40 MG tablet  Commonly known as:  PRINIVIL,ZESTRIL  Take 1 tablet (40 mg total) by mouth daily.  Start taking on:  11/26/2014     metFORMIN 500 MG tablet  Commonly known as:  GLUCOPHAGE  Take 500 mg  by mouth 2 (two) times daily with a meal.     oxyCODONE-acetaminophen 5-325 MG per tablet  Commonly known as:  PERCOCET/ROXICET  Take 1 tablet by mouth every 4 (four) hours as needed for severe pain.     potassium chloride SA 20 MEQ tablet  Commonly known as:  K-DUR,KLOR-CON  Take 20 mEq by mouth daily.     predniSONE 5 MG tablet  Commonly known as:  DELTASONE  Take 1 tablet (5 mg total) by mouth 2 (two) times daily with a meal.     senna-docusate 8.6-50 MG per tablet  Commonly known as:  Senokot-S  Take 1 tablet by mouth at bedtime as needed for mild constipation.     spironolactone 25 MG tablet  Commonly known as:  ALDACTONE  Take 1 tablet (25 mg total) by mouth daily.  Start taking on:  11/26/2014     tamsulosin 0.4 MG Caps capsule  Commonly known as:  FLOMAX  Take 1 capsule (0.4 mg total) by mouth daily.       No Known Allergies    The results of significant diagnostics from this hospitalization (including imaging, microbiology, ancillary and laboratory) are listed below for reference.    Significant Diagnostic Studies: Dg Ribs Unilateral Right  11/11/2014   CLINICAL DATA:  Abnormal rib uptake on the recent nuclear medicine bone scan. Prostate cancer.  EXAM: RIGHT RIBS - 2 VIEW  COMPARISON:  Bone scan 11/04/2014.  FINDINGS: No visible fracture noted within the right ribs. In particular within the anterior right ribs. No visible focal rib lesion.  There is cardiomegaly with vascular congestion. No focal opacity in the right lung.  IMPRESSION: No focal airspace no visible right rib fracture or focal rib lesion.   Electronically Signed   By: Rolm Baptise M.D.   On: 11/11/2014 16:11   Mr Lumbar Spine W Wo Contrast  11/15/2014   CLINICAL DATA:  Back pain for the last 2-3 months. Personal history of prostate cancer with abnormal bone scan at the thoracolumbar junction.  EXAM: MRI LUMBAR SPINE WITHOUT AND WITH CONTRAST  TECHNIQUE: Multiplanar and multiecho pulse sequences of the  lumbar spine were obtained without and with intravenous contrast.  CONTRAST:  1mL MULTIHANCE GADOBENATE DIMEGLUMINE 529 MG/ML IV SOLN  COMPARISON:  Bone scan 11/04/2014  FINDINGS: T10 vertebra is normal. T10-11 disc shows minimal bulging. Mild facet hypertrophy. No significant stenosis.  T11 vertebral body shows a compression fracture with loss of height anteriorly of 70%. No retropulsed bone. No finding to suggest that this represents anything other than an osteoporotic fracture. T11-12 disc bulges minimally. No herniation. No stenosis.  T12 vertebral body shows  a compression fracture with loss of height of 50%. No retropulsed bone. No finding to suggest that this represents anything other than an osteoporotic fracture. T12-L1 disc bulges mildly. No stenosis or neural compression.  L1 vertebral body is intact. L1-2 disc is expanded because of an L2 fracture. No herniation or stenosis.  L2 vertebral body shows a superior endplate fracture with loss of height of 20%. No retropulsed bone. No finding to suggest that this represents anything other than osteoporotic fracture. No stenosis. L2-3 disc is normal.  L3 vertebral body is intact. New L3-4 disc bulges mildly. Mild facet and ligamentous hypertrophy.  L4 vertebral body is intact. Mild bulging of the L4-5 disc. No stenosis.  L5 vertebral body is intact. Shallow protrusion of disc material without neural compression. Mild facet degeneration.  Upper sacrum is normal.  No evidence of regional metastatic disease.  IMPRESSION: No evidence of regional metastatic disease.  Acute/subacute compression fractures at T11, T12 and L2. These are consistent with benign osteoporotic fractures. Most edematous change is noted at the T12 level. No retropulsed bone. No stenosis or neural compression.   Electronically Signed   By: Nelson Chimes M.D.   On: 11/15/2014 13:36   Nm Bone Scan Whole Body  11/04/2014   CLINICAL DATA:  Prostate cancer.  EXAM: NUCLEAR MEDICINE WHOLE BODY BONE  SCAN  TECHNIQUE: Whole body anterior and posterior images were obtained approximately 3 hours after intravenous injection of radiopharmaceutical.  RADIOPHARMACEUTICALS:  25.0 mCi Technetium-99 MDP  COMPARISON:  CT 09/17/2014.  Bone scan 04/20/2012.  FINDINGS: Bilateral renal function and excretion is present. Increased activity noted in the region of T11 and T12 vertebral bodies. Prior CT of 09/17/2014 reveals a compression fracture of T11. MRI of the thoracic spine should be considered for further evaluation. Punctate increased activity noted over the right anterior fourth and seventh ribs. Although these changes could be secondary to fracture, metastatic lesions could present in this fashion. No other focal bony abnormalities noted to suggest metastatic disease. Mild increased activity noted over the perineal region is most likely from urine contamination. If patient has symptoms in the perineal region an AP view of the pelvis could be obtained to exclude a focal bony lesion .  IMPRESSION: 1. Increased activity noted in the T11 and T12 vertebral bodies. Recent CT of 09/17/2014 reveals compression fracture of T1. MRI lumbar spine should be considered for further evaluation. 2. Punctate area of increased activity noted in the anterior most aspects of the right fourth and seventh ribs. Although these changes could secondary to fractures, metastatic disease cannot be excluded. Right rib series can be obtained to further evaluate. 3. Increased activity noted of the perineal region. These changes are most likely secondary to urine contamination. If the patient has symptoms in the perineal region AP view of the pelvis can be obtained to exclude a of focal bony lesion.   Electronically Signed   By: Marcello Moores  Register   On: 11/04/2014 14:41   Dg Chest Port 1 View  11/20/2014   CLINICAL DATA:  Shortness of breath, bilateral leg edema.  EXAM: PORTABLE CHEST - 1 VIEW  COMPARISON:  Right rib series 11/11/2014, frontal and  lateral chest radiographs 09/21/2014 .  FINDINGS: Lower lung volumes from prior exam. The heart remains enlarged. There is tortuosity and atherosclerosis of the thoracic aorta. Hilar prominence is similar to prior exam. Vascular congestion again seen, likely accentuated by technique. No confluent airspace disease. No large pleural effusion. No pneumothorax.  IMPRESSION: Hypoventilatory chest.  Question worsening vascular congestion versus differences in technique. Cardiomegaly again seen.   Electronically Signed   By: Jeb Levering M.D.   On: 11/20/2014 19:16    Microbiology: No results found for this or any previous visit (from the past 240 hour(s)).   Labs: Basic Metabolic Panel:  Recent Labs Lab 11/22/14 0655 11/22/14 1552 11/23/14 0615 11/24/14 0525 11/25/14 0100 11/25/14 0531  NA 142  --  139 141 137 138  K 3.3*  --  3.7 4.2 4.0 3.6  CL 103  --  100 100 97 96  CO2 35*  --  35* 38* 36* 38*  GLUCOSE 130*  --  64* 115* 86 87  BUN 9  --  14 17 21 19   CREATININE 0.78  --  0.84 0.84 0.85 0.73  CALCIUM 8.5  --  8.5 8.7 8.4 8.4  MG  --  1.4*  --   --   --   --    Liver Function Tests:  Recent Labs Lab 11/20/14 1919 11/21/14 0422  AST 18 16  ALT 14 12  ALKPHOS 127* 107  BILITOT 0.7 0.8  PROT 6.3 5.3*  ALBUMIN 3.0* 2.6*   No results for input(s): LIPASE, AMYLASE in the last 168 hours. No results for input(s): AMMONIA in the last 168 hours. CBC:  Recent Labs Lab 11/20/14 1919 11/21/14 0422 11/25/14 0100  WBC 3.9* 5.4 4.0  NEUTROABS 2.6  --   --   HGB 10.5* 9.7* 9.5*  HCT 34.2* 31.2* 31.1*  MCV 82.8 82.3 84.5  PLT 225 199 193   Cardiac Enzymes:  Recent Labs Lab 11/20/14 1919 11/20/14 2253 11/21/14 0422 11/21/14 1040  TROPONINI 0.14* 0.13* 0.11* 0.10*   BNP: BNP (last 3 results)  Recent Labs  11/27/13 0554 05/19/14 0845 07/02/14 1326  PROBNP 1323.0* 7039.0* 5754.0*   CBG:  Recent Labs Lab 11/24/14 1151 11/24/14 1657 11/24/14 2059  11/25/14 0738 11/25/14 1121  GLUCAP 171* 122* 105* 71 169*       Signed:  Makalynn Berwanger M  [facility] Pager: [phone] 11/25/2014, 1:08 PM

## 2014-11-25 NOTE — Clinical Social Work Placement (Signed)
Clinical Social Work Department CLINICAL SOCIAL WORK PLACEMENT NOTE 11/25/2014  Patient:  Oscar Hickman, Oscar Hickman  Account Number:  [id number] Admit date:  11/20/2014  Clinical Social Worker:  Benay Pike, LCSW  Date/time:  11/22/2014 02:47 PM  Clinical Social Work is seeking post-discharge placement for this patient at the following level of care:   SKILLED NURSING   (*CSW will update this form in Epic as items are completed)   11/22/2014  Patient/family provided with [place] Department of Clinical Social Work's list of facilities offering this level of care within the geographic area requested by the patient (or if unable, by the patient's family).  11/22/2014  Patient/family informed of their freedom to choose among providers that offer the needed level of care, that participate in Medicare, Medicaid or managed care program needed by the patient, have an available bed and are willing to accept the patient.  11/22/2014  Patient/family informed of [facility]' ownership interest in [facility], as well as of the fact that they are under no obligation to receive care at this facility.  PASARR submitted to EDS on 11/22/2014 PASARR number received on 11/22/2014  FL2 transmitted to all facilities in geographic area requested by pt/family on  11/22/2014 FL2 transmitted to all facilities within larger geographic area on   Patient informed that his/her managed care company has contracts with or will negotiate with  certain facilities, including the following:     Patient/family informed of bed offers received:  11/22/2014 Patient chooses bed at [facility] Physician recommends and patient chooses bed at  [facility]  Patient to be transferred to [facility] on  11/25/2014 Patient to be transferred to facility by RN Patient and family notified of transfer on 11/25/2014 Name of family member notified:  Stanton Kidney- wife  The following physician request were  entered in Epic:   Additional Comments:  Benay Pike, [place]

## 2014-11-25 NOTE — Progress Notes (Signed)
UR chart review completed.  

## 2014-11-25 NOTE — Progress Notes (Signed)
Physical Therapy Treatment Patient Details Name: Oscar Hickman MRN: [id number] DOB: 1941-02-02 Today's Date: 11/25/2014    History of Present Illness 74 year old male who   has a past medical history of Skin infection; HTN (hypertension) (11/23/2011); High cholesterol; Diabetes mellitus; Chronic headache; Anxiety; Osteoarthritis; COPD (chronic obstructive pulmonary disease); Peripheral neuropathy; Prostate cancer (2009); CHF (congestive heart failure); Medical non-compliance; Heart disease; Cognitive dysfunction; Back pain; Traumatic compression fracture of T11 thoracic vertebra (11/08/2014); and Osteoporosis (11/19/2014). Today came to the hospital beginning of gradual onset generalized weakness, fatigue progressive shortness of breath. Patient has a history of prostate cancer and has been complaining of back pain. He recently underwent MRI of the lumbar spine on 11/15/2014 which showed no evidence of regional metastatic disease, showed acute/subacute compression fractures at T11-T12 and L2, benign osteoporotic fractures. Patient also has a retained ureteral stent for over one year, he was diagnosed with UTI and right-sided hydronephrosis. Patient has an appointment to see urologist as outpatient.  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 Cardiology was consulted by the ED physician Dr. Noemi Chapel, who recommended diuresis. Patient also has admitted troponin 0.14 in the ED.    PT Comments    Patient received supine in bed on 2LPM O2, pleasantly confused and willing to participate in skilled PT session. Supine to sit with Mod(A) and Mod cues, extended time; patient did require frequent redirection and facilitation of correct transfer techniques and did not follow PT directions, at times re-elevating [facility] after PT had explained necessity of practicing with [facility] depressed due to lack of hospital bed at home. Mild posterior LOB  during initial sit with min(A) to correct until patient able to independently maintain static sitting. Sit to stand with Max(A)x2 and bed elevated; gait approximately 4f with Min(A) for navigation and safe use of AD throughout. Patient continued to require frequent redirection throughout session. Pt education regarding possible SNF placement; patient did not appear to give signs of acknowledging or agreeing to education, instead quoting verse from [place] Pt left sitting upright in chair with chair alarm activated and all needs otherwise met. Vitals remained WNL throughout session.   Follow Up Recommendations  SNF     Equipment Recommendations  None recommended by PT    Recommendations for Other Services       Precautions / Restrictions Precautions Precautions: Fall Precaution Comments: Recent compression fracture T11, ejection fraction 20% Restrictions Weight Bearing Restrictions: No    Mobility  Bed Mobility Overal bed mobility: Needs Assistance Bed Mobility: Supine to Sit     Supine to sit: Mod assist (Mod cues)     General bed mobility comments: extended time required to complete task  Transfers Overall transfer level: Needs assistance Equipment used: Rolling walker (2 wheeled) Transfers: Sit to/from Stand Sit to Stand: Max assist;+2 physical assistance         General transfer comment: Poor safety awareness and overall mobility  Ambulation/Gait Ambulation/Gait assistance: Min assist Ambulation Distance (Feet): 40 Feet Assistive device: Rolling walker (2 wheeled) Gait Pattern/deviations: Decreased step length - right;Decreased step length - left;Decreased dorsiflexion - right;Decreased dorsiflexion - left;Trunk flexed   Gait velocity interpretation: Below normal speed for age/gender     Stairs            Wheelchair Mobility    Modified Rankin (Stroke Patients Only)       Balance Overall balance assessment: Needs assistance Sitting-balance  support: Bilateral upper extremity supported  Sitting balance-Leahy Scale: Fair Sitting balance - Comments: posterior lean iniitially upon static sitting, Min guard to Min(A) until patient able to recover and maintain balance Postural control: Posterior lean Standing balance support: Bilateral upper extremity supported Standing balance-Leahy Scale: Fair                      Cognition Arousal/Alertness: Awake/alert Behavior During Therapy: Impulsive (required frequent redirection) Overall Cognitive Status: No family/caregiver present to determine baseline cognitive functioning       Memory: Decreased recall of precautions;Decreased short-term memory              Exercises      General Comments        Pertinent Vitals/Pain Pain Assessment: No/denies pain    Home Living                      Prior Function            PT Goals (current goals can now be found in the care plan section) Acute Rehab PT Goals Patient Stated Goal: none PT Goal Formulation: Patient unable to participate in goal setting Time For Goal Achievement: 12/06/14 Potential to Achieve Goals: Fair Progress towards PT goals: Progressing toward goals    Frequency  Min 3X/week    PT Plan Current plan remains appropriate    Co-evaluation             End of Session Equipment Utilized During Treatment: Gait belt;Oxygen Activity Tolerance: Patient tolerated treatment well (appeared to be confused throughout session, required frequent redirection) Patient left: in chair;with call bell/phone within reach;with chair alarm set     Time: 0927-0950 PT Time Calculation (min) (ACUTE ONLY): 23 min  Charges:  $Gait Training: 8-22 mins $Therapeutic Activity: 8-22 mins                    G Codes:      Hunt Oris PT, DPT 11/25/2014, 10:00 AM

## 2014-11-26 LAB — GLUCOSE, CAPILLARY
GLUCOSE-CAPILLARY: 143 mg/dL — AB (ref 70–99)
GLUCOSE-CAPILLARY: 72 mg/dL (ref 70–99)
Glucose-Capillary: 152 mg/dL — ABNORMAL HIGH (ref 70–99)
Glucose-Capillary: 156 mg/dL — ABNORMAL HIGH (ref 70–99)
Glucose-Capillary: 185 mg/dL — ABNORMAL HIGH (ref 70–99)

## 2014-11-27 ENCOUNTER — Non-Acute Institutional Stay (SKILLED_NURSING_FACILITY): Payer: Medicare HMO | Admitting: Internal Medicine

## 2014-11-27 DIAGNOSIS — I420 Dilated cardiomyopathy: Secondary | ICD-10-CM

## 2014-11-27 DIAGNOSIS — I110 Hypertensive heart disease with heart failure: Secondary | ICD-10-CM

## 2014-11-27 DIAGNOSIS — E1140 Type 2 diabetes mellitus with diabetic neuropathy, unspecified: Secondary | ICD-10-CM

## 2014-11-27 DIAGNOSIS — C61 Malignant neoplasm of prostate: Secondary | ICD-10-CM

## 2014-11-27 DIAGNOSIS — I509 Heart failure, unspecified: Secondary | ICD-10-CM

## 2014-11-27 LAB — GLUCOSE, CAPILLARY
GLUCOSE-CAPILLARY: 115 mg/dL — AB (ref 70–99)
Glucose-Capillary: <10 mg/dL — CL (ref 70–99)
Glucose-Capillary: 20 mg/dL — CL (ref 70–99)
Glucose-Capillary: 62 mg/dL — ABNORMAL LOW (ref 70–99)
Glucose-Capillary: <10 mg/dL — CL (ref 70–99)
Glucose-Capillary: 85 mg/dL (ref 70–99)
Glucose-Capillary: 114 mg/dL — ABNORMAL HIGH (ref 70–99)
Glucose-Capillary: 119 mg/dL — ABNORMAL HIGH (ref 70–99)
Glucose-Capillary: 210 mg/dL — ABNORMAL HIGH (ref 70–99)

## 2014-11-27 NOTE — Progress Notes (Signed)
Patient ID: Oscar Hickman, male   DOB: 06-30-1941, 74 y.o.   MRN: [id number]  Facility; [facility] Chief complaint; admission to SNF post admit to [facility] from 1/13 to 1/18  History; this is a medically complex man who I gather was admitted with congestive heart failure. He has a known cardiomyopathy with an ejection fraction of 20-25%. He was optimized with good diuresis over 7 days. Think there is some issue with cognitive dysfunction/? Delirium in hospital. He has a lot of pain in his lower back. This was extensively worked up with a bone scan and an MRI. Ultimately this suggested a benign osteoporotic fractures rather than having anything to do with his metastatic adenocarcinoma of the prostate. Nevertheless it would appear that the patient is still in a lot of pain.  He is also a type II diabetic on insulin and he has already been hypoglycemic this morning with a blood sugar of 40 requiring oral sugar. He is on Lantus insulin 24 units daily and Glucophage 500 twice a day.  Past Medical History  Diagnosis Date  . Skin infection   . Essential hypertension   . Mixed hyperlipidemia   . Type 2 diabetes mellitus with peripheral neuropathy   . Chronic headache   . Anxiety   . Osteoarthritis   . 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 Nonischemic cardiomyopathy     LVEF 25% 2012  . Medical non-compliance   . Cognitive dysfunction   . Traumatic compression fracture of T11 thoracic vertebra   . Osteoporosis    . Past Surgical History  Procedure Laterality Date  . Transurethral resection of prostate  03/2008  . Cardiac catheterization  02/2009    Minimal non-obstructive CAD  . Cystoscopy w/ ureteral stent placement Right 04/06/2013    Procedure: CYSTOSCOPY WITH RETROGRADE PYELOGRAM/URETERAL STENT PLACEMENT;  Surgeon: Marissa Nestle, MD;  Location: [facility];  Service: Urology;  Laterality: Right;  . Cystoscopy w/ ureteral stent removal Right 04/06/2013     Procedure: CYSTOSCOPY WITH STENT REMOVAL;  Surgeon: Marissa Nestle, MD;  Location: [facility];  Service: Urology;  Laterality: Right;    Current Outpatient Prescriptions on File Prior to Visit  Medication Sig Dispense Refill  . abiraterone Acetate (ZYTIGA) 250 MG tablet Take 4 tablets (1,000 mg total) by mouth daily. Take on an empty stomach 1 hour before or 2 hours after a meal (Patient not taking: Reported on 11/20/2014) 120 tablet 0  . albuterol (PROVENTIL) (2.5 MG/3ML) 0.083% nebulizer solution Take 3 mLs (2.5 mg total) by nebulization every 2 (two) hours as needed for wheezing or shortness of breath. 75 mL 12  . aspirin EC 81 MG tablet Take 81 mg by mouth daily.    . calcium-vitamin D (OSCAL WITH D) 500-200 MG-UNIT per tablet Take 2 tablets by mouth daily with breakfast. 60 tablet 11  . carvedilol (COREG) 25 MG tablet Take 25 mg by mouth 2 (two) times daily with a meal.    . ENSURE PLUS (ENSURE PLUS) LIQD Take 237 mLs by mouth daily.    . enzalutamide (XTANDI) 40 MG capsule Take 4 capsules (160 mg total) by mouth daily. 120 capsule 0  . fentaNYL (DURAGESIC - DOSED MCG/HR) 25 MCG/HR patch Place 1 patch (25 mcg total) onto the skin every 3 (three) days. 10 patch 0  . furosemide (LASIX) 20 MG tablet Take 2 tablets (40 mg total) by mouth daily. 10 tablet 0  . hydrALAZINE (  APRESOLINE) 25 MG tablet Take 1 tablet (25 mg total) by mouth every 8 (eight) hours. 90 tablet 1  . Insulin Glargine (LANTUS) 100 UNIT/ML Solostar Pen Inject 24-25 Units into the skin daily.    . isosorbide dinitrate (ISORDIL) 20 MG tablet Take 20 mg by mouth 3 (three) times daily.     Marland Kitchen lisinopril (PRINIVIL,ZESTRIL) 40 MG tablet Take 1 tablet (40 mg total) by mouth daily. 30 tablet 1  . metFORMIN (GLUCOPHAGE) 500 MG tablet Take 500 mg by mouth 2 (two) times daily with a meal.     . oxyCODONE-acetaminophen (PERCOCET/ROXICET) 5-325 MG per tablet Take 1 tablet by mouth every 4 (four) hours as needed for severe pain. 180 tablet 0   . potassium chloride SA (K-DUR,KLOR-CON) 20 MEQ tablet Take 20 mEq by mouth daily.    . predniSONE (DELTASONE) 5 MG tablet Take 1 tablet (5 mg total) by mouth 2 (two) times daily with a meal. 60 tablet 2  . senna-docusate (SENOKOT-S) 8.6-50 MG per tablet Take 1 tablet by mouth at bedtime as needed for mild constipation.    Marland Kitchen spironolactone (ALDACTONE) 25 MG tablet Take 1 tablet (25 mg total) by mouth daily. 30 tablet 1  . tamsulosin (FLOMAX) 0.4 MG CAPS capsule Take 1 capsule (0.4 mg total) by mouth daily. 30 capsule 1    Social history; patient states he lives outside [place] with his wife. His exact functional status is unclear. It would appear talking to him that he was on insulin although I am not sure what form and what dose. Was also on chronic oxygen if his history is to believe.  reports that he has never smoked. He has never used smokeless tobacco. He reports that he does not drink alcohol or use illicit drugs.  family history includes Cancer in his father and mother.  Review of systems Respiratory; he denies cough or shortness of breath Cardiac; no clear chest pain GI; no clear abdominal pain nausea vomiting or diarrhea. In fact the patient may be constipated Endocrine; as mentioned the patient was on insulin at home although I'm not sure exactly what form or what dose. The patient does not know what hemoglobin A1c is. He has already been hypoglycemic this morning we will need to back off on his treatment  Physical exam Gen.; patient does not appear to be in any distress when I see him now. His hypoglycemia has been corrected his CBG is 115. I will recheck this however. Vitals O2 sat is 95% on 2 L respirations 22 pulse rate 88.  HEENT; lid lag bilaterally. No thyroid is palpable Respiratory; surprisingly clear air entry bilaterally. No crackles no wheezes Cardiac; perhaps some PACs. No murmurs no S3. His JVP is elevated at 45 with a very fulminant V waves Abdomen; distended.  Bowel sounds are present and rushes. There is no tenderness no liver no spleen. He has a small umbilical hernia that reduces easily GU; bladder is not distended there is no CVA tenderness Extremities; 3+ edema up to his upper thighs 3+ coccyx edema. Neurologic; he is diffusely hyporeflexic. His right plantar is upgoing although there is nothing else that I can elicit to localize this. Musculoskeletal; he has difficulty even rolling in bed due to pain in his back. There is no focal tenderness however. Mental status; the patient seems somewhat confused superficially. However he is able to respond to direct questions. Seems to know that he was hypoglycemic this morning.  Impression/plan #1 congestive heart failure with a severe  cardiomyopathy. 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 He is on Lasix 40 mg and aldactone 25. I will put him on one of our scaled beds. No adjustments for now. The exact nature of his cardiomyopathy I can't really determine from a cursory review of his records. He is  On ace, BB, hydralazine and isosorbide dinitrate. His echocardiogram is shown below. Showing severe ejection fraction limitation in the 20-25% range also moderate to severe LVH. #2 metastatic prostate cancer. His exact status here isn't completely clear. I know he follows with oncology. #3 osteoporotic fractures at T11 and 12. He is in very significant pain presumably related to this. There was no evidence of prostate cancer at these levels. ? Vitamin D status. Not on calcium #4 cognitive impairment listed in the hospital question delirium. He was hypoglycemic this morning therefore comments on this seem a little premature to me. However he seems to be able to answer some direct questions white well. Would wonder about his baseline functional level and history #5 hypertension; presumably poorly controlled with LVH #6 type 2 diabetes on insulin. At the bedside he probably has diabetic  neuropathy. He has already been hypoglycemic as noted #7 osteoporosis  crush fractures. Would wonder about his androgen status. Is also on prednisone for reasons that are not totally clear. Marland Kitchen He is not on a biphosphonate. Would also wonder about Prolia. Nor is he on calcium. I don't see a vitamin D level #8 looking through his records I note that he had a suppressed TSH at 0.177 on 11/15/13. This would dictate a recheck of his complete thyroid panel including free T3 and free T4.  For multiple reasons this patient is at very high risk of return to the hospital.

## 2014-11-28 LAB — GLUCOSE, CAPILLARY
GLUCOSE-CAPILLARY: 75 mg/dL (ref 70–99)
GLUCOSE-CAPILLARY: 237 mg/dL — AB (ref 70–99)
Glucose-Capillary: 77 mg/dL (ref 70–99)
Glucose-Capillary: 146 mg/dL — ABNORMAL HIGH (ref 70–99)

## 2014-11-29 LAB — GLUCOSE, CAPILLARY
GLUCOSE-CAPILLARY: 74 mg/dL (ref 70–99)
Glucose-Capillary: 158 mg/dL — ABNORMAL HIGH (ref 70–99)
Glucose-Capillary: 169 mg/dL — ABNORMAL HIGH (ref 70–99)
Glucose-Capillary: 239 mg/dL — ABNORMAL HIGH (ref 70–99)

## 2014-11-30 LAB — GLUCOSE, CAPILLARY
GLUCOSE-CAPILLARY: 169 mg/dL — AB (ref 70–99)
Glucose-Capillary: 35 mg/dL — CL (ref 70–99)
Glucose-Capillary: 54 mg/dL — ABNORMAL LOW (ref 70–99)
Glucose-Capillary: 97 mg/dL (ref 70–99)
Glucose-Capillary: 152 mg/dL — ABNORMAL HIGH (ref 70–99)
Glucose-Capillary: 257 mg/dL — ABNORMAL HIGH (ref 70–99)

## 2014-12-01 LAB — GLUCOSE, CAPILLARY
GLUCOSE-CAPILLARY: 65 mg/dL — AB (ref 70–99)
GLUCOSE-CAPILLARY: 100 mg/dL — AB (ref 70–99)
Glucose-Capillary: 119 mg/dL — ABNORMAL HIGH (ref 70–99)
Glucose-Capillary: 118 mg/dL — ABNORMAL HIGH (ref 70–99)
Glucose-Capillary: 145 mg/dL — ABNORMAL HIGH (ref 70–99)

## 2014-12-02 ENCOUNTER — Non-Acute Institutional Stay (SKILLED_NURSING_FACILITY): Payer: Medicare HMO | Admitting: Internal Medicine

## 2014-12-02 DIAGNOSIS — E11649 Type 2 diabetes mellitus with hypoglycemia without coma: Secondary | ICD-10-CM

## 2014-12-02 DIAGNOSIS — I420 Dilated cardiomyopathy: Secondary | ICD-10-CM

## 2014-12-02 LAB — GLUCOSE, CAPILLARY
GLUCOSE-CAPILLARY: 159 mg/dL — AB (ref 70–99)
Glucose-Capillary: 104 mg/dL — ABNORMAL HIGH (ref 70–99)
Glucose-Capillary: 75 mg/dL (ref 70–99)
Glucose-Capillary: 132 mg/dL — ABNORMAL HIGH (ref 70–99)
Glucose-Capillary: 176 mg/dL — ABNORMAL HIGH (ref 70–99)

## 2014-12-03 LAB — GLUCOSE, CAPILLARY
GLUCOSE-CAPILLARY: 72 mg/dL (ref 70–99)
Glucose-Capillary: 102 mg/dL — ABNORMAL HIGH (ref 70–99)
Glucose-Capillary: 124 mg/dL — ABNORMAL HIGH (ref 70–99)
Glucose-Capillary: 230 mg/dL — ABNORMAL HIGH (ref 70–99)

## 2014-12-04 ENCOUNTER — Non-Acute Institutional Stay (SKILLED_NURSING_FACILITY): Payer: Medicare HMO | Admitting: Internal Medicine

## 2014-12-04 DIAGNOSIS — I110 Hypertensive heart disease with heart failure: Secondary | ICD-10-CM

## 2014-12-04 DIAGNOSIS — I509 Heart failure, unspecified: Secondary | ICD-10-CM

## 2014-12-04 DIAGNOSIS — I420 Dilated cardiomyopathy: Secondary | ICD-10-CM

## 2014-12-04 LAB — GLUCOSE, CAPILLARY
GLUCOSE-CAPILLARY: 174 mg/dL — AB (ref 70–99)
Glucose-Capillary: 149 mg/dL — ABNORMAL HIGH (ref 70–99)
Glucose-Capillary: 217 mg/dL — ABNORMAL HIGH (ref 70–99)

## 2014-12-04 NOTE — Progress Notes (Addendum)
Patient ID: Oscar Hickman, male   DOB: 1941-06-18, 74 y.o.   MRN: [id number]               PROGRESS NOTE  DATE:  12/02/2014                  FACILITY: [facility]                              LEVEL OF CARE:   SNF   Acute Visit   CHIEF COMPLAINT:  Weight gain.    HISTORY OF PRESENT ILLNESS:  Oscar Hickman is a gentleman who came to [place] after being admitted to hospital with congestive heart failure.  He has a known cardiomyopathy with an ejection fraction of 20-25%.    He came to [place] on Lasix 40 mg a day, as well as aldactone 25 mg a day.  In spite of this, his weights have actually gone from 175.2 pounds on 11/26/2014 to 184.8 pounds on 12/02/2014.  He appears to drink quite a bit.  He may be drinking excessively.  For now, I really do not feel the need to fluid restrict him versus increasing his diuretics.    He has also had some trouble with hypoglycemia.  This morning, his blood sugar was 75; yesterday, 65.  He was frankly hypoglycemic when he came into the building before I had even seen him, requiring measures to correct this.  I actually reduced his Lantus to 20 U and discontinued the metformin.  His blood sugars yesterday were 65/118/100/145.    REVIEW OF SYSTEMS:   GENERAL:  The patient states he feels fine.   CHEST/RESPIRATORY:  No cough.  No sputum.      CARDIAC:   No chest pain.     GI:  No nausea, vomiting or diarrhea.    PHYSICAL EXAMINATION:        GENERAL APPEARANCE:  The patient is not in any distress.    CHEST/RESPIRATORY:  Exam actually sounds clear.   CARDIOVASCULAR:  CARDIAC:  Heart sounds are distant.  His JVP is elevated to the angle of the jaw at 90.   EDEMA/VARICOSITIES:  He has 3+ pitting edema of his coccyx, with edema well up into his groin area.  Extremities:  No evidence of a DVT.  However, massive edema.   GASTROINTESTINAL:  ABDOMEN:   Nontender.     ASSESSMENT/PLAN:                                      Congestive heart failure with a  severe cardiomyopathy.  His current Lasix dose is 40, aldactone at 25.  I am going to increase the Lasix to 80 mg.  I do not have lab work on the chart from last week.  I will see if I can search this down.  It is possible he may need to be fluid-restricted.    Diabetic hypoglycemia.  I am going to reduce his Lantus to 15 U.  We will need to check his BUN and creatinine.

## 2014-12-05 LAB — GLUCOSE, CAPILLARY
GLUCOSE-CAPILLARY: 159 mg/dL — AB (ref 70–99)
GLUCOSE-CAPILLARY: 181 mg/dL — AB (ref 70–99)
Glucose-Capillary: 167 mg/dL — ABNORMAL HIGH (ref 70–99)
Glucose-Capillary: 120 mg/dL — ABNORMAL HIGH (ref 70–99)

## 2014-12-06 LAB — GLUCOSE, CAPILLARY
GLUCOSE-CAPILLARY: 70 mg/dL (ref 70–99)
Glucose-Capillary: 71 mg/dL (ref 70–99)
Glucose-Capillary: 98 mg/dL (ref 70–99)
Glucose-Capillary: 135 mg/dL — ABNORMAL HIGH (ref 70–99)
Glucose-Capillary: 189 mg/dL — ABNORMAL HIGH (ref 70–99)

## 2014-12-07 LAB — GLUCOSE, CAPILLARY
GLUCOSE-CAPILLARY: 77 mg/dL (ref 70–99)
GLUCOSE-CAPILLARY: 219 mg/dL — AB (ref 70–99)
Glucose-Capillary: 192 mg/dL — ABNORMAL HIGH (ref 70–99)

## 2014-12-08 LAB — GLUCOSE, CAPILLARY
GLUCOSE-CAPILLARY: 147 mg/dL — AB (ref 70–99)
GLUCOSE-CAPILLARY: 132 mg/dL — AB (ref 70–99)
GLUCOSE-CAPILLARY: 275 mg/dL — AB (ref 70–99)

## 2014-12-08 NOTE — Progress Notes (Addendum)
Patient ID: Oscar Hickman, male   DOB: 1941/05/06, 74 y.o.   MRN: [id number]                PROGRESS NOTE  DATE:  12/04/2014             FACILITY: [facility]                   LEVEL OF CARE:   SNF   Acute Visit                        HISTORY OF PRESENT ILLNESS:  This is a man who came to [place] from [facility].   He has a cardiomyopathy with an ejection fraction of 20-25%.  I believe he presented because of this.    Once he arrived here, his weight went up promptly by 10 pounds.  I had to increase his Lasix to 80 from 40, and I am seeing him today in follow-up of this.    He also has been discovered to have a 25-hydroxy vitamin D level of 15, which will need replacement.  I think I did this because of gait ataxia issues.    His weight has gone from 184.8 pounds on 12/02/2014 down to 181.2 pounds.  This is satisfactory for the moment.  I have not fluid-restricted him.    His blood sugars seem to be under fairly good control and were recorded as 102/124/119/230 yesterday after I had to reduce his Lantus insulin due to fasting hypoglycemia.    LABORATORY DATA:   Lab work today shows:    Sodium is 138, potassium 4.6, BUN 20, creatinine 0.1, although his total CO2 is 36 which will need watching.    As mentioned, his 25-hydroxy vitamin D level was 15.    REVIEW OF SYSTEMS:    CHEST/RESPIRATORY:  The patient has chronic oxygen.  He is not complaining of shortness of breath.   CARDIAC:  No clear chest pain.   EDEMA/VARICOSITIES:  Extremities:  He thinks his edema is better.     PHYSICAL EXAMINATION:   VITAL SIGNS:   O2 SATURATIONS:  95% on 2 L.   PULSE:   76 and regular.   CHEST/RESPIRATORY:  Clear air entry bilaterally.    CARDIOVASCULAR:   CARDIAC:  There is a soft midsystolic murmur.  No S3.  His JVP is elevated at 45.      EDEMA/VARICOSITIES:  Still massive lower extremity edema, although I think this looks better.    ASSESSMENT/PLAN:                        Ischemic cardiomyopathy with congestive heart failure.  I had to increase his diuretics as noted.  He has lost three pounds in two days.  This would be satisfactory weight loss if it continues.    Vitamin D deficiency.  Start him on vitamin D3 oral supplement.

## 2014-12-09 ENCOUNTER — Non-Acute Institutional Stay (SKILLED_NURSING_FACILITY): Payer: Medicare HMO | Admitting: Internal Medicine

## 2014-12-09 DIAGNOSIS — E1140 Type 2 diabetes mellitus with diabetic neuropathy, unspecified: Secondary | ICD-10-CM

## 2014-12-09 DIAGNOSIS — I420 Dilated cardiomyopathy: Secondary | ICD-10-CM

## 2014-12-09 LAB — GLUCOSE, CAPILLARY
GLUCOSE-CAPILLARY: 188 mg/dL — AB (ref 70–99)
Glucose-Capillary: 134 mg/dL — ABNORMAL HIGH (ref 70–99)
Glucose-Capillary: 137 mg/dL — ABNORMAL HIGH (ref 70–99)
Glucose-Capillary: 132 mg/dL — ABNORMAL HIGH (ref 70–99)

## 2014-12-10 LAB — GLUCOSE, CAPILLARY
GLUCOSE-CAPILLARY: 67 mg/dL — AB (ref 70–99)
GLUCOSE-CAPILLARY: 68 mg/dL — AB (ref 70–99)
GLUCOSE-CAPILLARY: 89 mg/dL (ref 70–99)
GLUCOSE-CAPILLARY: 236 mg/dL — AB (ref 70–99)
GLUCOSE-CAPILLARY: 266 mg/dL — AB (ref 70–99)
Glucose-Capillary: 95 mg/dL (ref 70–99)

## 2014-12-11 ENCOUNTER — Other Ambulatory Visit: Payer: Self-pay | Admitting: *Deleted

## 2014-12-11 DIAGNOSIS — S22080D Wedge compression fracture of T11-T12 vertebra, subsequent encounter for fracture with routine healing: Secondary | ICD-10-CM

## 2014-12-11 DIAGNOSIS — C61 Malignant neoplasm of prostate: Secondary | ICD-10-CM

## 2014-12-11 DIAGNOSIS — IMO0001 Reserved for inherently not codable concepts without codable children: Secondary | ICD-10-CM

## 2014-12-11 DIAGNOSIS — M4850XS Collapsed vertebra, not elsewhere classified, site unspecified, sequela of fracture: Secondary | ICD-10-CM

## 2014-12-11 LAB — GLUCOSE, CAPILLARY
GLUCOSE-CAPILLARY: 73 mg/dL (ref 70–99)
GLUCOSE-CAPILLARY: 161 mg/dL — AB (ref 70–99)
Glucose-Capillary: 172 mg/dL — ABNORMAL HIGH (ref 70–99)
Glucose-Capillary: 164 mg/dL — ABNORMAL HIGH (ref 70–99)
Glucose-Capillary: 353 mg/dL — ABNORMAL HIGH (ref 70–99)

## 2014-12-11 MED ORDER — OXYCODONE-ACETAMINOPHEN 5-325 MG PO TABS: ORAL_TABLET | ORAL | Status: DC

## 2014-12-11 NOTE — Progress Notes (Addendum)
Patient ID: Oscar Hickman, male   DOB: 1941/07/22, 74 y.o.   MRN: [id number]               PROGRESS NOTE  DATE:  12/09/2014           FACILITY: [facility]       LEVEL OF CARE:   SNF   Acute Visit   CHIEF COMPLAINT:  Follow up weight gain.    HISTORY OF PRESENT ILLNESS:  This is a patient who came to [place] from [facility].  He has a cardiomyopathy with an ejection fraction of 20-25%.    Once he arrived here, his weight promptly went up by eight pounds.  I increased his Lasix from 40 to 80.  He had a three-pound weight loss.  However, since that time he has gone up to 185.2 pounds, essentially a four-pound weight gain since 12/04/2014 and a 10-pound weight gain from 11/27/2014.    The patient does not have any complaints, although the staff feel that he has been increasingly confused.  I note that his Percocet was increased recently.  I am not really sure why.  A urine culture was done for 75,000 colonies of multiple bacterial morphotypes.    REVIEW OF SYSTEMS:   CHEST/RESPIRATORY:  The patient has chronic oxygen.  He is not complaining of shortness of breath.   CARDIAC:   No clear chest pain.  GI: no abdominal pain no diarrhea  EDEMA/VARICOSITIES:  Extremities:   He does not think his edema is any worse.    PHYSICAL EXAMINATION:   VITAL SIGNS:   BLOOD PRESSURE:  136/86.   O2 SATURATIONS:  98% on 2 L.   PULSE:  84.   RESPIRATIONS:  18.   TEMPERATURE:  98.6.   GENERAL APPEARANCE:  The patient is not in any obvious distress.   CHEST/RESPIRATORY:  Air entry is clear bilaterally.   CARDIOVASCULAR:  CARDIAC:    Heart sounds are soft.  He has a 2/6 pansystolic murmur at the PMI.  His JVP is elevated to well beyond the angle of the jaw.   EDEMA/VARICOSITIES:  Extremities:  He has widespread edema, including coccyx edema extending right up into his groin area.   GASTROINTESTINAL:  ABDOMEN:   His abdominal examination is reasonably benign.  I do not believe he has any  ascites.    ASSESSMENT/PLAN:                       Congestive heart failure.  Mostly right-sided, secondary to an ischemic cardiomyopathy.  He is not having any obvious chest pain.  Every time I am in the room, there is a lot of liquid here.  He may need to be fluid-restricted.  Unfortunately, his lab work is not yet available.    Type 2 diabetes.  When he first came here, I had to reduce his Lantus insulin and stop the metformin.  His blood sugars yesterday were 132/127/129/275.  For now, I think this is satisfactory.    His Lasix will be increased from 80 to 100 mg daily.  I am going to fluid-restrict him to 1500 cc.  Follow up on this in two days' time.

## 2014-12-11 NOTE — Telephone Encounter (Signed)
[facility] 

## 2014-12-12 ENCOUNTER — Other Ambulatory Visit (HOSPITAL_COMMUNITY): Payer: Self-pay | Admitting: Oncology

## 2014-12-12 DIAGNOSIS — C61 Malignant neoplasm of prostate: Secondary | ICD-10-CM

## 2014-12-12 LAB — GLUCOSE, CAPILLARY
GLUCOSE-CAPILLARY: 130 mg/dL — AB (ref 70–99)
Glucose-Capillary: 83 mg/dL (ref 70–99)
Glucose-Capillary: 231 mg/dL — ABNORMAL HIGH (ref 70–99)

## 2014-12-12 MED ORDER — ENZALUTAMIDE 40 MG PO CAPS: 160.0000 mg | ORAL_CAPSULE | Freq: Every day | ORAL | Status: DC

## 2014-12-13 ENCOUNTER — Non-Acute Institutional Stay (SKILLED_NURSING_FACILITY): Payer: Medicare HMO | Admitting: Internal Medicine

## 2014-12-13 ENCOUNTER — Encounter: Payer: Self-pay | Admitting: Internal Medicine

## 2014-12-13 ENCOUNTER — Other Ambulatory Visit (HOSPITAL_COMMUNITY): Payer: Self-pay | Admitting: Oncology

## 2014-12-13 DIAGNOSIS — I10 Essential (primary) hypertension: Secondary | ICD-10-CM

## 2014-12-13 DIAGNOSIS — E118 Type 2 diabetes mellitus with unspecified complications: Secondary | ICD-10-CM

## 2014-12-13 DIAGNOSIS — C61 Malignant neoplasm of prostate: Secondary | ICD-10-CM

## 2014-12-13 DIAGNOSIS — I5022 Chronic systolic (congestive) heart failure: Secondary | ICD-10-CM

## 2014-12-13 LAB — GLUCOSE, CAPILLARY
GLUCOSE-CAPILLARY: 198 mg/dL — AB (ref 70–99)
Glucose-Capillary: 153 mg/dL — ABNORMAL HIGH (ref 70–99)
Glucose-Capillary: 119 mg/dL — ABNORMAL HIGH (ref 70–99)
Glucose-Capillary: 188 mg/dL — ABNORMAL HIGH (ref 70–99)

## 2014-12-13 NOTE — Progress Notes (Signed)
Patient ID: Oscar Hickman, male   DOB: 04-01-1941, 74 y.o.   MRN: [id number]   this is a discharge note acility; [facility] Chief complaint; discharge note  History; this is a medically complex man who apparently was admitted with congestive heart failure. He has a known cardiomyopathy with an ejection fraction of 20-25%. He was optimized with good diuresis over 7 days. some issue with cognitive dysfunction/? Delirium in hospital. He had a lot of pain in his lower back. This was extensively worked up with a bone scan and an MRI. Ultimately this suggested a benign osteoporotic fractures rather than having anything to do with his metastatic adenocarcinoma of the prostate.  appears to be somewhat better controled l he is on a Duragesic patch as well as Percocet.  He is also a type II diabetic on insulin-initially hypoglycemia was an issue here this appears to have stabilized appears his blood sugar runs mainly in the mid 100s the lowest one that I see recently is a 95 several days ago in the morning but this is not consistent-he does occasionally have high 200s at bedtime  Concerning his CHF-his Lasix has been increased her 100 mg a day secondary to weight gain-he continues have somewhat variable weights today is 183 apparently has been as high as 185-again he will need expedient follow-up when he is discharged.  Apparently there are insurance issues which is prompting the discharge-  Currently vital signs are stable he does not complain of increased shortness of breath or chest pain although again he is a very fragile individual   Past Medical History  Diagnosis Date  . Skin infection   . Essential hypertension   . Mixed hyperlipidemia   . Type 2 diabetes mellitus with peripheral neuropathy   . Chronic headache   . Anxiety   . Osteoarthritis   . 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 Nonischemic cardiomyopathy     LVEF 25% 2012  . Medical  non-compliance   . Cognitive dysfunction   . Traumatic compression fracture of T11 thoracic vertebra   . Osteoporosis    . Past Surgical History  Procedure Laterality Date  . Transurethral resection of prostate  03/2008  . Cardiac catheterization  02/2009    Minimal non-obstructive CAD  . Cystoscopy w/ ureteral stent placement Right 04/06/2013    Procedure: CYSTOSCOPY WITH RETROGRADE PYELOGRAM/URETERAL STENT PLACEMENT;  Surgeon: Marissa Nestle, MD;  Location: [facility];  Service: Urology;  Laterality: Right;  . Cystoscopy w/ ureteral stent removal Right 04/06/2013    Procedure: CYSTOSCOPY WITH STENT REMOVAL;  Surgeon: Marissa Nestle, MD;  Location: [facility];  Service: Urology;  Laterality: Right;    Current Outpatient Prescriptions on File Prior to Visit  Medication Sig Dispense Refill  . abiraterone Acetate (ZYTIGA) 250 MG tablet Take 4 tablets (1,000 mg total) by mouth daily. Take on an empty stomach 1 hour before or 2 hours after a meal (Patient not taking: Reported on 11/20/2014) 120 tablet 0  . albuterol (PROVENTIL) (2.5 MG/3ML) 0.083% nebulizer solution Take 3 mLs (2.5 mg total) by nebulization every 2 (two) hours as needed for wheezing or shortness of breath. 75 mL 12  . aspirin EC 81 MG tablet Take 81 mg by mouth daily.    . calcium-vitamin D (OSCAL WITH D) 500-200 MG-UNIT per tablet Take 2 tablets by mouth daily with breakfast. 60 tablet 11  . carvedilol (COREG) 25 MG tablet Take 25 mg by  mouth 2 (two) times daily with a meal.    . ENSURE PLUS (ENSURE PLUS) LIQD Take 237 mLs by mouth daily.    . enzalutamide (XTANDI) 40 MG capsule Take 4 capsules (160 mg total) by mouth daily. 120 capsule 0  . fentaNYL (DURAGESIC - DOSED MCG/HR) 25 MCG/HR patch Place 1 patch (25 mcg total) onto the skin every 3 (three) days. 10 patch 0  . furosemide (LASIX) 20 MG tablet Take 5 tablets (100 mg total) by mouth daily. 10 tablet 0  . hydrALAZINE (APRESOLINE) 25 MG tablet Take 1 tablet (25 mg total) by  mouth every 8 (eight) hours. 90 tablet 1  . Insulin Glargine (LANTUS) 100 UNIT/ML Solostar Pen Inject 15Units into the skin daily.    . isosorbide dinitrate (ISORDIL) 20 MG tablet Take 20 mg by mouth 3 (three) times daily.     Marland Kitchen lisinopril (PRINIVIL,ZESTRIL) 40 MG tablet Take 1 tablet (40 mg total) by mouth daily. 30 tablet 1  . metFORMIN (GLUCOPHAGE) 500 MG tablet Take 500 mg by mouth 2 (two) times daily with a meal.     . oxyCODONE-acetaminophen (PERCOCET/ROXICET) 5-325 MG per tablet Take 1 tablet by mouth every 3 hours as needed for severe pain. 180 tablet 0  . potassium chloride SA (K-DUR,KLOR-CON) 20 MEQ tablet Take 20 mEq by mouth daily.    . predniSONE (DELTASONE) 5 MG tablet Take 1 tablet (5 mg total) by mouth 2 (two) times daily with a meal. 60 tablet 2  . senna-docusate (SENOKOT-S) 8.6-50 MG per tablet Take 1 tablet by mouth at bedtime as needed for mild constipation.    Marland Kitchen spironolactone (ALDACTONE) 25 MG tablet Take 1 tablet (25 mg total) by mouth daily. 30 tablet 1  . tamsulosin (FLOMAX) 0.4 MG CAPS capsule Take 1 capsule (0.4 mg total) by mouth daily. 30 capsule 1    Social history; patient states he lives outside [place] with his wife.  .  reports that he has never smoked. He has never used smokeless tobacco. He reports that he does not drink alcohol or use illicit drugs.  family history includes Cancer in his father and mother.  Review of systems  General does not complain of fever or chills Respiratory; he denies cough or shortness of breath Cardiac; no clear chest pain GI; no clear abdominal pain nausea vomiting or diarrhea. Or constipation Endocrine;  Estrella diabetes as stated above.  Musculoskeletal again has had some significant history pain as noted above but this appears to be better controlled.  Neurologic does not complain of dizziness headache or syncopal-type feelings.    Physical exam  Temperature 98.4 pulse 76 respirations 20 blood pressure 130/86  weight today is 183.  In general this is a frail elderly male in no distress but appears quite weak.  His skin is warm and dry  HEENT; lid lag bilaterally.  Respiratory;  clear air entry bilaterally. No crackles no wheezes--however poor respiratory effort Cardiac; --regular rate and rhythm without murmur gallop or rub he has 1-2+ lower extremity edema bilaterally TED hose are in place he has minimal sacral edema Abdomen; distended. Bowel sounds are present . There is no tenderness. He has a small umbilical hernia that reduces easily  Extremities; has lower extremity weakness this has been in his baseline during his stay here Neurologic;  His speech is clear there are no lateralizing findings. Musculoskeletal; Significant weakness will need extensive therapy-apparently has equipment is available at home. Mental status; the patient seems somewhat confused superficially. However  he is able to respond to direct questions.    Labs.  12/09/2014.  Sodium 135 potassium 4.3 CO2 32 BUN 17 creatinine 0.77.  11/25/2014.  Sodium 138 potassium 3.6 BUN 19 creatinine 0.73.  WBC-4.0hemoglobin 9.5 platelets 193  Impression/plan #1 congestive heart failure with a severe cardiomyopathy. 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 He is on Lasix 100 mg and aldactone 25.  He is  On ace, BB, hydralazine and isosorbide dinitrate. His echocardiogram. Showing severe ejection fraction limitation in the 20-25% range also moderate to severe LVH. #2 metastatic prostate cancer. His exact status here isn't completely clear.  he follows with oncology. #3 osteoporotic fractures at T11 and 12. Continues on Duragesic patch and Percocet for pain control. There was no evidence of prostate cancer at these levels. -He is on vitamin D as well as calcium #4 cognitive impairment listed in the hospital question delirium. Apparently this has improved somewhat although patient does not talk much on  exam today appeared to be responsive and appropriate  #5 hypertension; presumably poorly controlled with LVH--I got 130/86 manually today I see previous readings 142/100-127/77-134/86-this will warrant follow-up by primary care provider-- #6 type 2 diabetes on insulin. As noted above this is reasonably stable-suspect this could be titrated some as an outpatient will not change medications right before discharge  Again patient would benefit from continued monitoring of his numerous medical issues especially CHF-however apparently there are insurance issues which are prompting discharge he will need expedient follow-up by primary care provider as well as cardiology-also will update a CBC and BMP tomorrow.  At this point he does not appear to be unstable but continues to be fragile.  [id number] note greater than 30 minutes spent on this discharge summary   .  Marland Kitchen

## 2014-12-14 ENCOUNTER — Encounter (HOSPITAL_COMMUNITY)
Admission: RE | Admit: 2014-12-14 | Discharge: 2014-12-14 | Disposition: A | Discharge status: Home / Self Care | Payer: Medicare HMO | Source: Skilled Nursing Facility | Attending: Internal Medicine | Admitting: Internal Medicine

## 2014-12-14 LAB — COMPREHENSIVE METABOLIC PANEL
ALBUMIN: 2.7 g/dL — AB (ref 3.5–5.2)
ALT: 10 U/L (ref 0–53)
AST: 14 U/L (ref 0–37)
Alkaline Phosphatase: 93 U/L (ref 39–117)
Anion gap: 7 (ref 5–15)
BUN: 19 mg/dL (ref 6–23)
CO2: 33 mmol/L — ABNORMAL HIGH (ref 19–32)
Calcium: 9 mg/dL (ref 8.4–10.5)
Chloride: 97 mmol/L (ref 96–112)
Creatinine, Ser: 0.75 mg/dL (ref 0.50–1.35)
GFR calc Af Amer: >90 mL/min (ref >90)
GFR calc non Af Amer: 89 mL/min — ABNORMAL LOW (ref >90)
Glucose, Bld: 165 mg/dL — ABNORMAL HIGH (ref 70–99)
POTASSIUM: 4.2 mmol/L (ref 3.5–5.1)
Sodium: 137 mmol/L (ref 135–145)
Total Bilirubin: 0.7 mg/dL (ref 0.3–1.2)
Total Protein: 5.4 g/dL — ABNORMAL LOW (ref 6.0–8.3)

## 2014-12-14 LAB — CBC WITH DIFFERENTIAL/PLATELET
Basophils Absolute: 0 10*3/uL (ref 0.0–0.1)
Basophils Relative: 1 % (ref 0–1)
EOS PCT: 3 % (ref 0–5)
Eosinophils Absolute: 0.1 10*3/uL (ref 0.0–0.7)
HCT: 30.3 % — ABNORMAL LOW (ref 39.0–52.0)
Hemoglobin: 9.2 g/dL — ABNORMAL LOW (ref 13.0–17.0)
LYMPHS ABS: 0.8 10*3/uL (ref 0.7–4.0)
Lymphocytes Relative: 21 % (ref 12–46)
MCH: 25.4 pg — AB (ref 26.0–34.0)
MCHC: 30.4 g/dL (ref 30.0–36.0)
MCV: 83.7 fL (ref 78.0–100.0)
MONOS PCT: 15 % — AB (ref 3–12)
Monocytes Absolute: 0.6 10*3/uL (ref 0.1–1.0)
NEUTROS ABS: 2.4 10*3/uL (ref 1.7–7.7)
NEUTROS PCT: 60 % (ref 43–77)
Platelets: 211 10*3/uL (ref 150–400)
RBC: 3.62 MIL/uL — ABNORMAL LOW (ref 4.22–5.81)
RDW: 18.9 % — AB (ref 11.5–15.5)
WBC: 3.9 10*3/uL — ABNORMAL LOW (ref 4.0–10.5)

## 2014-12-14 LAB — GLUCOSE, CAPILLARY: GLUCOSE-CAPILLARY: 164 mg/dL — AB (ref 70–99)

## 2014-12-16 ENCOUNTER — Ambulatory Visit (HOSPITAL_COMMUNITY): Payer: Self-pay | Admitting: Hematology & Oncology

## 2014-12-16 ENCOUNTER — Telehealth (HOSPITAL_COMMUNITY): Payer: Self-pay | Admitting: Oncology

## 2014-12-16 ENCOUNTER — Other Ambulatory Visit (HOSPITAL_COMMUNITY): Payer: Self-pay | Admitting: Oncology

## 2014-12-16 DIAGNOSIS — C61 Malignant neoplasm of prostate: Secondary | ICD-10-CM

## 2014-12-16 LAB — GLUCOSE, CAPILLARY
GLUCOSE-CAPILLARY: 152 mg/dL — AB (ref 70–99)
GLUCOSE-CAPILLARY: 166 mg/dL — AB (ref 70–99)
Glucose-Capillary: 202 mg/dL — ABNORMAL HIGH (ref 70–99)
Glucose-Capillary: 179 mg/dL — ABNORMAL HIGH (ref 70–99)
Glucose-Capillary: 221 mg/dL — ABNORMAL HIGH (ref 70–99)
Glucose-Capillary: 148 mg/dL — ABNORMAL HIGH (ref 70–99)
Glucose-Capillary: 212 mg/dL — ABNORMAL HIGH (ref 70–99)

## 2014-12-16 MED ORDER — ENZALUTAMIDE 40 MG PO CAPS: 160.0000 mg | ORAL_CAPSULE | Freq: Every day | ORAL | Status: AC

## 2014-12-17 DIAGNOSIS — I429 Cardiomyopathy, unspecified: Secondary | ICD-10-CM | POA: Diagnosis not present

## 2014-12-17 DIAGNOSIS — E119 Type 2 diabetes mellitus without complications: Secondary | ICD-10-CM | POA: Diagnosis not present

## 2014-12-17 DIAGNOSIS — M8088XD Other osteoporosis with current pathological fracture, vertebra(e), subsequent encounter for fracture with routine healing: Secondary | ICD-10-CM | POA: Diagnosis not present

## 2014-12-17 DIAGNOSIS — I509 Heart failure, unspecified: Secondary | ICD-10-CM | POA: Diagnosis not present

## 2014-12-23 ENCOUNTER — Ambulatory Visit (HOSPITAL_COMMUNITY): Payer: Self-pay | Admitting: Hematology & Oncology

## 2014-12-25 ENCOUNTER — Telehealth (HOSPITAL_COMMUNITY): Payer: Self-pay | Admitting: *Deleted

## 2014-12-25 NOTE — Telephone Encounter (Signed)
Patient went home from [facility] center 2/7 after his 20 day allowable stay while his wife was having cardiac procedure. She called today stating that he is not doing well. Pain is not relieved with fentanyl patch and oxycodone 1 every 3 hours. He cries out in pain when he moves, not able to walk, he is on O2.  He has appt 2/22 with Dr. Whitney Muse. He is taking xtandi per wife. She did mention that she wondered if hospice could come in to help her. She states it will be a major task just to get him in for MD visit and that she has considered calling 911 (ems) to bring to ED because his pain is so bad. Info sent to Dr. Whitney Muse for advice

## 2014-12-25 NOTE — Telephone Encounter (Signed)
Wife notified that we referred patient to Hospice and she is ok with that. Patient's wife is still going to try and make the appt for next week to see Dr. Whitney Muse.

## 2014-12-26 ENCOUNTER — Other Ambulatory Visit (HOSPITAL_COMMUNITY): Payer: Self-pay | Admitting: *Deleted

## 2014-12-26 DIAGNOSIS — M4850XS Collapsed vertebra, not elsewhere classified, site unspecified, sequela of fracture: Secondary | ICD-10-CM

## 2014-12-26 DIAGNOSIS — IMO0001 Reserved for inherently not codable concepts without codable children: Secondary | ICD-10-CM

## 2014-12-26 DIAGNOSIS — C61 Malignant neoplasm of prostate: Secondary | ICD-10-CM

## 2014-12-26 DIAGNOSIS — S22080D Wedge compression fracture of T11-T12 vertebra, subsequent encounter for fracture with routine healing: Secondary | ICD-10-CM

## 2014-12-26 MED ORDER — OXYCODONE-ACETAMINOPHEN 5-325 MG PO TABS: ORAL_TABLET | ORAL | Status: AC

## 2014-12-26 MED ORDER — LORAZEPAM 0.5 MG PO TABS: 0.5000 mg | ORAL_TABLET | ORAL | Status: AC | PRN

## 2014-12-30 ENCOUNTER — Ambulatory Visit (HOSPITAL_COMMUNITY): Payer: Self-pay | Admitting: Hematology & Oncology

## 2014-12-31 ENCOUNTER — Ambulatory Visit: Payer: Medicare HMO | Admitting: Urology

## 2015-01-01 ENCOUNTER — Other Ambulatory Visit: Payer: Self-pay | Admitting: *Deleted

## 2015-01-01 ENCOUNTER — Ambulatory Visit (HOSPITAL_COMMUNITY): Payer: Self-pay | Admitting: Hematology & Oncology

## 2015-01-01 DIAGNOSIS — C61 Malignant neoplasm of prostate: Secondary | ICD-10-CM

## 2015-01-01 DIAGNOSIS — M4850XS Collapsed vertebra, not elsewhere classified, site unspecified, sequela of fracture: Secondary | ICD-10-CM

## 2015-01-01 DIAGNOSIS — IMO0001 Reserved for inherently not codable concepts without codable children: Secondary | ICD-10-CM

## 2015-01-01 DIAGNOSIS — S22080D Wedge compression fracture of T11-T12 vertebra, subsequent encounter for fracture with routine healing: Secondary | ICD-10-CM

## 2015-01-01 MED ORDER — FENTANYL 25 MCG/HR TD PT72: 25.0000 ug | MEDICATED_PATCH | TRANSDERMAL | Status: AC

## 2015-02-07 DEATH — deceased

## 2015-06-22 IMAGING — CR DG CHEST 2V
2 series · 2 of 2 positions shown · non-contrast
Comparison: 09/22/2013 it

CLINICAL DATA: Shortness of breath.

EXAM:
CHEST  2 VIEW

[view not recorded (1 of 2)]
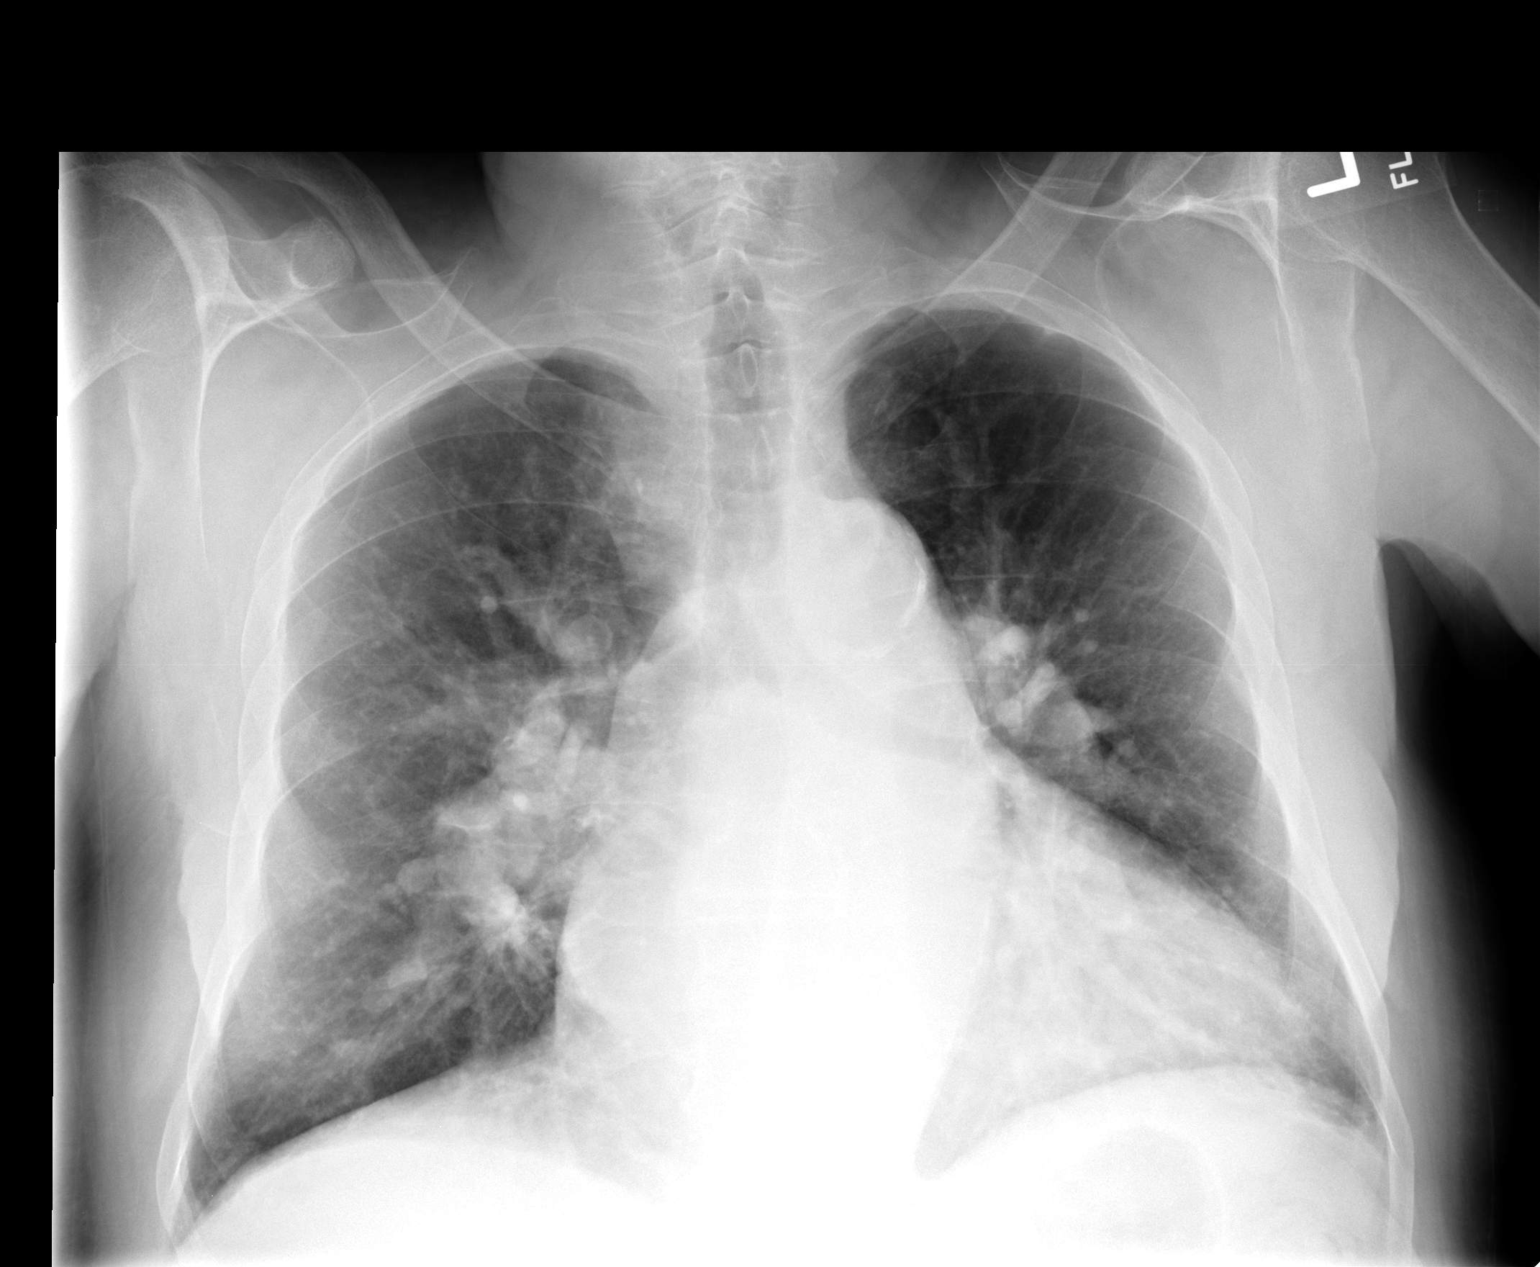

[view not recorded (2 of 2)]
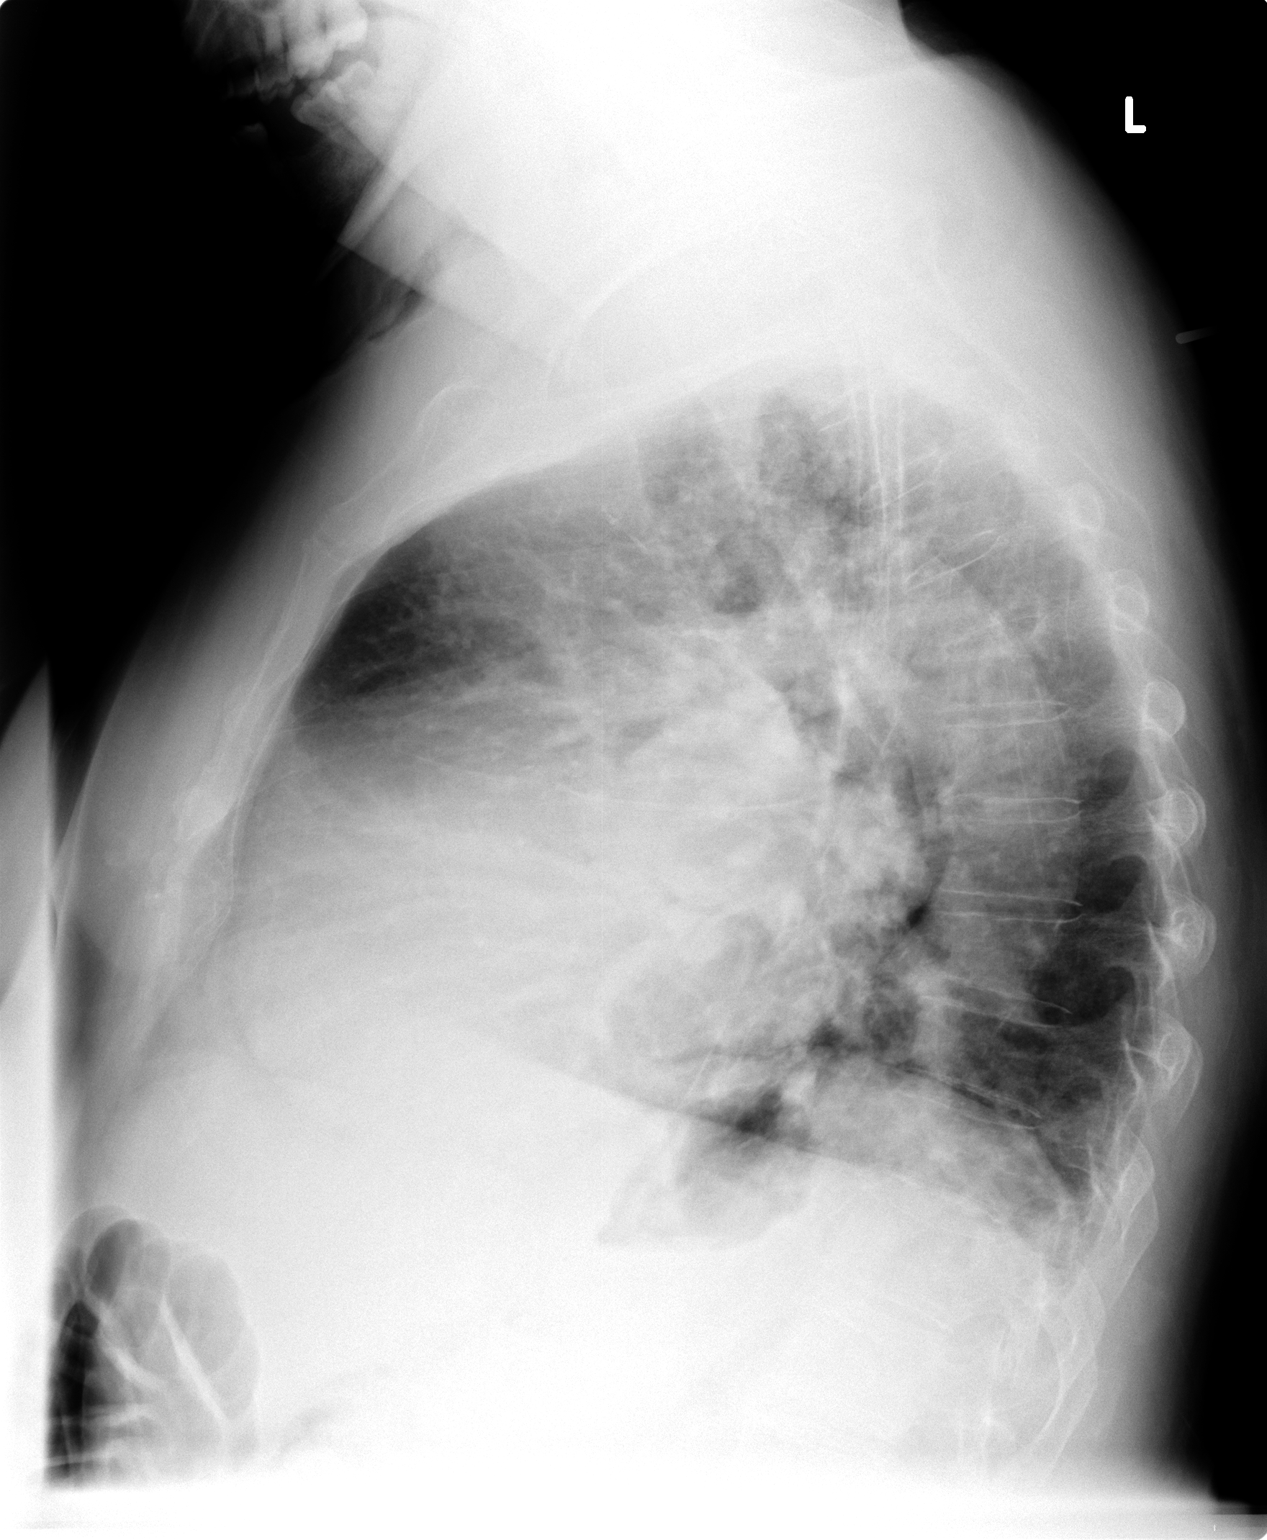

[2 of 2 positions shown; findings below may reference images not displayed]

FINDINGS: The cardiac silhouette is enlarged. There are no focal regions of
consolidation or focal infiltrates. There is enlargement of the
central pulmonary vasculature. The aorta is tortuous.
Atherosclerotic calcifications identified within the aorta. The
osseous structures are unremarkable.
IMPRESSION: No evidence of acute cardiopulmonary disease. There are findings
which may reflect sequela of pulmonary arterial hypertension.
Clinical correlation recommended. Atherosclerotic calcifications are
identified.

## 2015-07-17 IMAGING — CR DG CHEST 1V PORT
1 series · 1 of 1 positions shown · non-contrast
Comparison: PA and lateral chest x-ray at November 13, 2012

CLINICAL DATA: Congestion and dyspnea and weakness.

EXAM:
PORTABLE CHEST - 1 VIEW

[portable]
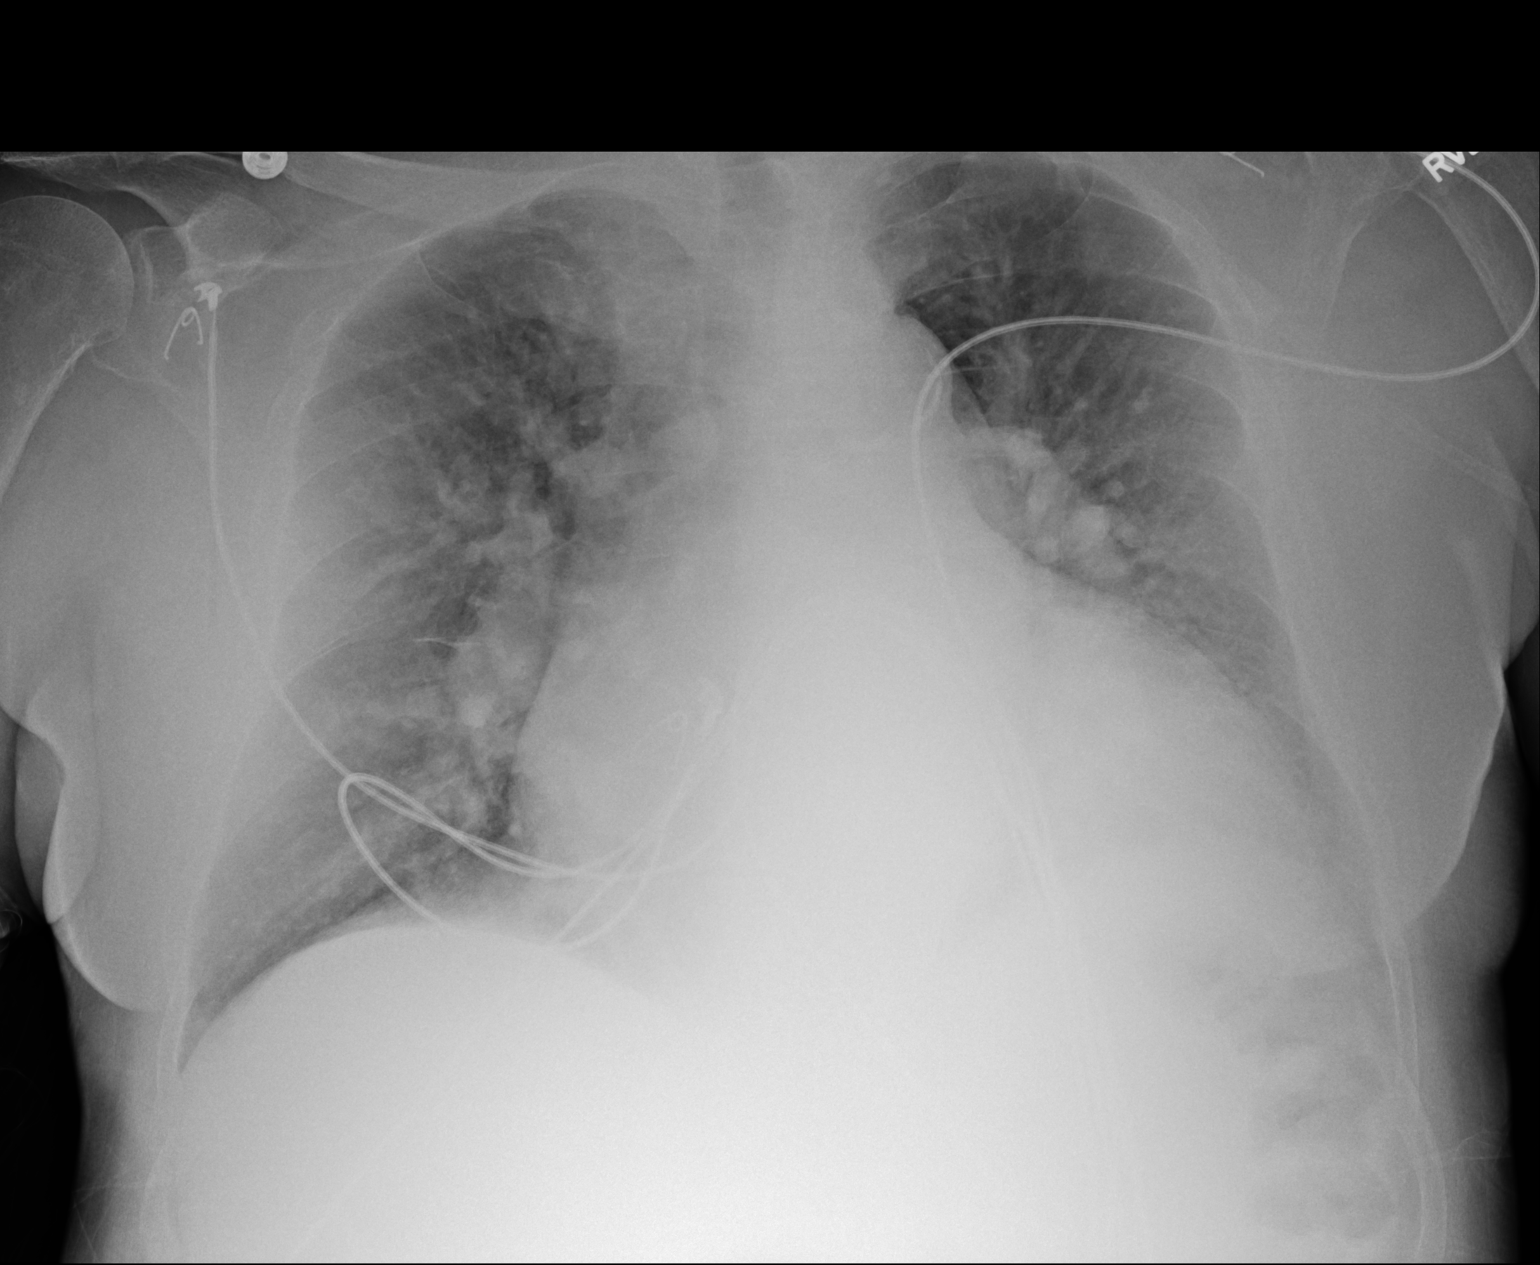

[1 of 1 positions shown; findings below may reference images not displayed]

FINDINGS: The cardiopericardial silhouette has increased further in size. The
pulmonary vascularity is engorged and indistinct. The pulmonary
interstitial markings are increased bilaterally. The left
hemidiaphragm is partially obscured. There is tortuosity of the
descending thoracic aorta. The observed portions of the bony thorax
exhibit no acute abnormalities.
IMPRESSION: Enlargement of the cardiopericardial silhouette, pulmonary vascular
congestion, and pulmonary interstitial edema are consistent with
congestive heart failure. There is no pleural effusion demonstrated
and no evidence of an alveolar infiltrate. There has been a
deterioration in the appearance of the chest since the previous
study.

## 2016-04-17 ENCOUNTER — Other Ambulatory Visit: Payer: Self-pay | Admitting: Nurse Practitioner

## 2016-07-03 IMAGING — CR DG RIBS 2V*R*
3 series · 3 of 3 positions shown · non-contrast
Comparison: Bone scan 11/04/2014.

CLINICAL DATA: Abnormal rib uptake on the recent nuclear medicine
bone scan. Prostate cancer.

EXAM:
RIGHT RIBS - 2 VIEW

[view not recorded (1 of 3)]
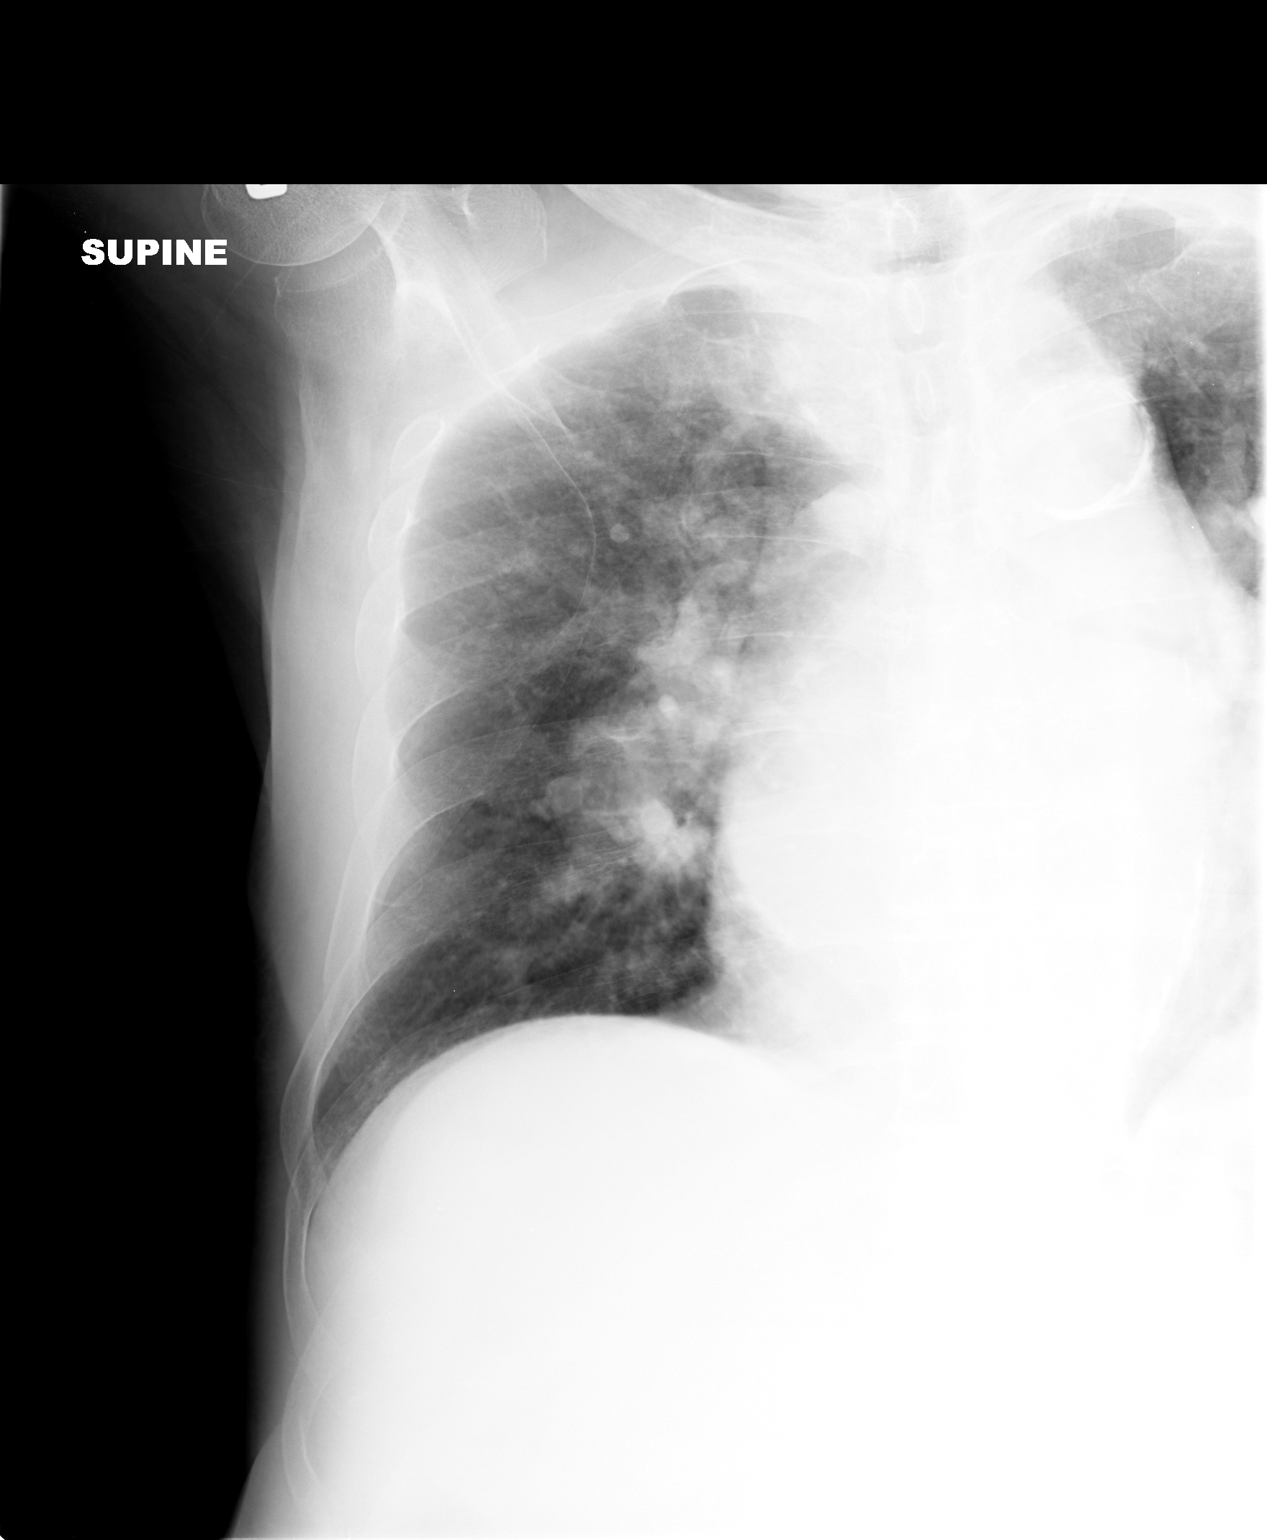

[view not recorded (2 of 3)]
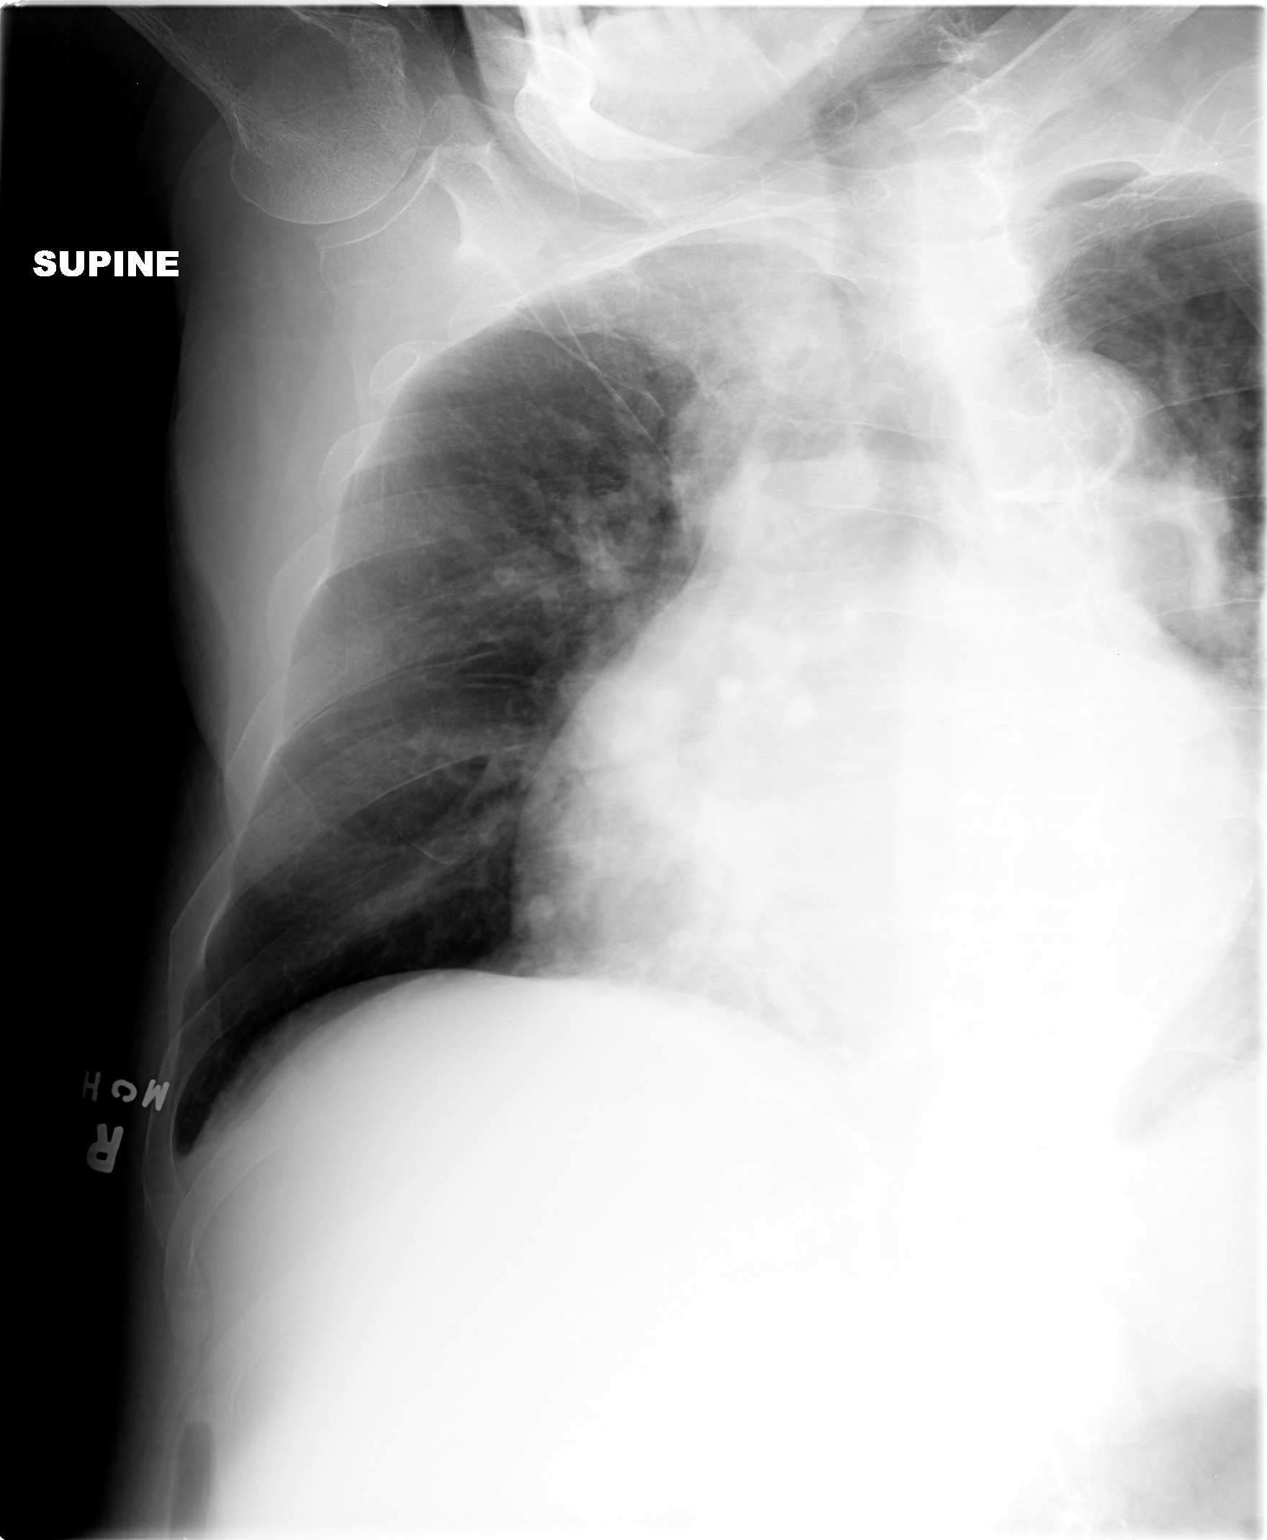

[view not recorded (3 of 3)]
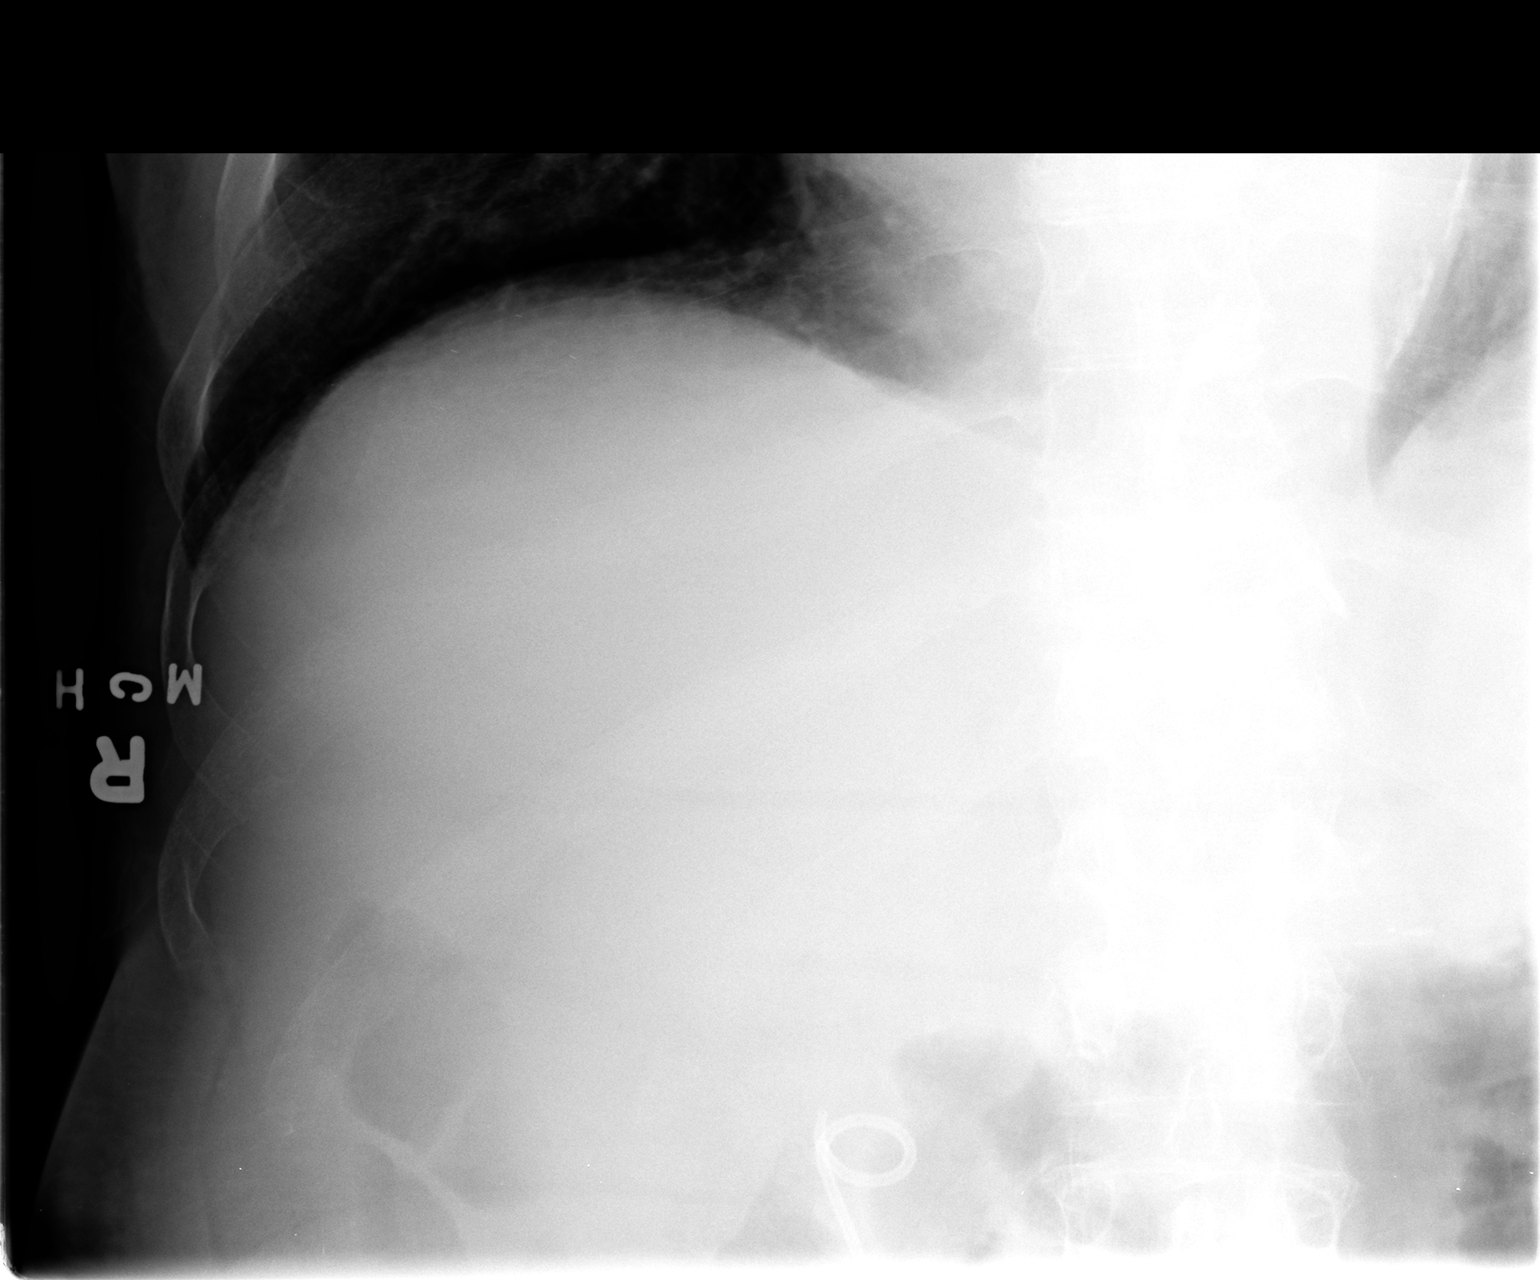

[3 of 3 positions shown; findings below may reference images not displayed]

FINDINGS: No visible fracture noted within the right ribs. In particular
within the anterior right ribs. No visible focal rib lesion.

There is cardiomegaly with vascular congestion. No focal opacity in
the right lung.
IMPRESSION: No focal airspace no visible right rib fracture or focal rib lesion.
